# Patient Record
Sex: Female | Born: 1950 | Race: White | Hispanic: No | Marital: Single | Smoking: Never smoker
Health system: Southern US, Community
[De-identification: ages and names within clinical notes are randomized; demographics above are authoritative.]

## PROBLEM LIST (undated history)

## (undated) DIAGNOSIS — M549 Dorsalgia, unspecified: Secondary | ICD-10-CM

## (undated) DIAGNOSIS — M961 Postlaminectomy syndrome, not elsewhere classified: Secondary | ICD-10-CM

## (undated) DIAGNOSIS — IMO0001 Reserved for inherently not codable concepts without codable children: Secondary | ICD-10-CM

## (undated) DIAGNOSIS — T7840XA Allergy, unspecified, initial encounter: Secondary | ICD-10-CM

## (undated) DIAGNOSIS — F329 Major depressive disorder, single episode, unspecified: Secondary | ICD-10-CM

## (undated) DIAGNOSIS — K635 Polyp of colon: Secondary | ICD-10-CM

## (undated) DIAGNOSIS — K219 Gastro-esophageal reflux disease without esophagitis: Secondary | ICD-10-CM

## (undated) DIAGNOSIS — F410 Panic disorder [episodic paroxysmal anxiety] without agoraphobia: Secondary | ICD-10-CM

## (undated) DIAGNOSIS — K5909 Other constipation: Secondary | ICD-10-CM

## (undated) DIAGNOSIS — J309 Allergic rhinitis, unspecified: Secondary | ICD-10-CM

## (undated) DIAGNOSIS — Z9181 History of falling: Secondary | ICD-10-CM

## (undated) DIAGNOSIS — J449 Chronic obstructive pulmonary disease, unspecified: Secondary | ICD-10-CM

## (undated) DIAGNOSIS — R519 Headache, unspecified: Secondary | ICD-10-CM

## (undated) DIAGNOSIS — J45909 Unspecified asthma, uncomplicated: Secondary | ICD-10-CM

## (undated) DIAGNOSIS — E785 Hyperlipidemia, unspecified: Secondary | ICD-10-CM

## (undated) DIAGNOSIS — Z903 Acquired absence of stomach [part of]: Secondary | ICD-10-CM

## (undated) DIAGNOSIS — E079 Disorder of thyroid, unspecified: Secondary | ICD-10-CM

## (undated) DIAGNOSIS — Z9842 Cataract extraction status, left eye: Secondary | ICD-10-CM

## (undated) DIAGNOSIS — M1990 Unspecified osteoarthritis, unspecified site: Secondary | ICD-10-CM

## (undated) DIAGNOSIS — F32A Depression, unspecified: Secondary | ICD-10-CM

## (undated) DIAGNOSIS — G4730 Sleep apnea, unspecified: Secondary | ICD-10-CM

## (undated) DIAGNOSIS — G5602 Carpal tunnel syndrome, left upper limb: Secondary | ICD-10-CM

## (undated) DIAGNOSIS — M51369 Other intervertebral disc degeneration, lumbar region without mention of lumbar back pain or lower extremity pain: Secondary | ICD-10-CM

## (undated) DIAGNOSIS — G2581 Restless legs syndrome: Secondary | ICD-10-CM

## (undated) DIAGNOSIS — R001 Bradycardia, unspecified: Secondary | ICD-10-CM

## (undated) DIAGNOSIS — M722 Plantar fascial fibromatosis: Secondary | ICD-10-CM

## (undated) DIAGNOSIS — G629 Polyneuropathy, unspecified: Secondary | ICD-10-CM

## (undated) DIAGNOSIS — K449 Diaphragmatic hernia without obstruction or gangrene: Secondary | ICD-10-CM

## (undated) DIAGNOSIS — G894 Chronic pain syndrome: Secondary | ICD-10-CM

## (undated) DIAGNOSIS — J189 Pneumonia, unspecified organism: Secondary | ICD-10-CM

## (undated) DIAGNOSIS — G4700 Insomnia, unspecified: Secondary | ICD-10-CM

## (undated) DIAGNOSIS — I10 Essential (primary) hypertension: Secondary | ICD-10-CM

## (undated) DIAGNOSIS — E039 Hypothyroidism, unspecified: Secondary | ICD-10-CM

## (undated) DIAGNOSIS — G5640 Causalgia of unspecified upper limb: Secondary | ICD-10-CM

## (undated) DIAGNOSIS — R0609 Other forms of dyspnea: Secondary | ICD-10-CM

## (undated) DIAGNOSIS — K589 Irritable bowel syndrome without diarrhea: Secondary | ICD-10-CM

## (undated) DIAGNOSIS — R7303 Prediabetes: Secondary | ICD-10-CM

## (undated) DIAGNOSIS — G43909 Migraine, unspecified, not intractable, without status migrainosus: Secondary | ICD-10-CM

## (undated) DIAGNOSIS — R011 Cardiac murmur, unspecified: Secondary | ICD-10-CM

## (undated) DIAGNOSIS — F419 Anxiety disorder, unspecified: Secondary | ICD-10-CM

## (undated) DIAGNOSIS — I272 Pulmonary hypertension, unspecified: Secondary | ICD-10-CM

## (undated) DIAGNOSIS — Z9841 Cataract extraction status, right eye: Secondary | ICD-10-CM

## (undated) DIAGNOSIS — J942 Hemothorax: Secondary | ICD-10-CM

## (undated) DIAGNOSIS — I5030 Unspecified diastolic (congestive) heart failure: Secondary | ICD-10-CM

## (undated) DIAGNOSIS — K5790 Diverticulosis of intestine, part unspecified, without perforation or abscess without bleeding: Secondary | ICD-10-CM

## (undated) DIAGNOSIS — M8580 Other specified disorders of bone density and structure, unspecified site: Secondary | ICD-10-CM

## (undated) DIAGNOSIS — M797 Fibromyalgia: Secondary | ICD-10-CM

## (undated) DIAGNOSIS — D126 Benign neoplasm of colon, unspecified: Secondary | ICD-10-CM

## (undated) DIAGNOSIS — K760 Fatty (change of) liver, not elsewhere classified: Secondary | ICD-10-CM

## (undated) DIAGNOSIS — D649 Anemia, unspecified: Secondary | ICD-10-CM

## (undated) DIAGNOSIS — F40240 Claustrophobia: Secondary | ICD-10-CM

## (undated) DIAGNOSIS — M539 Dorsopathy, unspecified: Secondary | ICD-10-CM

## (undated) DIAGNOSIS — U071 COVID-19: Secondary | ICD-10-CM

## (undated) DIAGNOSIS — H269 Unspecified cataract: Secondary | ICD-10-CM

## (undated) HISTORY — PX: TENDON REPAIR: SHX5111

## (undated) HISTORY — DX: Anemia, unspecified: D64.9

## (undated) HISTORY — PX: WRIST SURGERY: SHX841

## (undated) HISTORY — DX: Allergy, unspecified, initial encounter: T78.40XA

## (undated) HISTORY — DX: Unspecified osteoarthritis, unspecified site: M19.90

## (undated) HISTORY — DX: Irritable bowel syndrome, unspecified: K58.9

## (undated) HISTORY — DX: Other specified disorders of bone density and structure, unspecified site: M85.80

## (undated) HISTORY — DX: Diverticulosis of intestine, part unspecified, without perforation or abscess without bleeding: K57.90

## (undated) HISTORY — DX: Hyperlipidemia, unspecified: E78.5

## (undated) HISTORY — DX: Dorsalgia, unspecified: M54.9

## (undated) HISTORY — DX: Panic disorder (episodic paroxysmal anxiety): F41.0

## (undated) HISTORY — DX: Benign neoplasm of colon, unspecified: D12.6

## (undated) HISTORY — DX: Plantar fascial fibromatosis: M72.2

## (undated) HISTORY — DX: Other intervertebral disc degeneration, lumbar region without mention of lumbar back pain or lower extremity pain: M51.369

## (undated) HISTORY — DX: Postlaminectomy syndrome, not elsewhere classified: M96.1

## (undated) HISTORY — DX: Fatty (change of) liver, not elsewhere classified: K76.0

## (undated) HISTORY — DX: Unspecified cataract: H26.9

## (undated) HISTORY — PX: ABDOMINAL HYSTERECTOMY: SHX81

## (undated) HISTORY — DX: Acquired absence of stomach (part of): Z90.3

## (undated) HISTORY — DX: Polyp of colon: K63.5

## (undated) HISTORY — DX: History of falling: Z91.81

## (undated) HISTORY — PX: CARPAL TUNNEL RELEASE: SHX101

## (undated) HISTORY — DX: Diaphragmatic hernia without obstruction or gangrene: K44.9

## (undated) HISTORY — DX: Disorder of thyroid, unspecified: E07.9

## (undated) HISTORY — PX: WOUND DEBRIDEMENT: SHX247

## (undated) HISTORY — PX: COLONOSCOPY: SHX174

## (undated) HISTORY — PX: WRIST ARTHROPLASTY: SHX1088

## (undated) HISTORY — DX: Depression, unspecified: F32.A

## (undated) HISTORY — DX: Major depressive disorder, single episode, unspecified: F32.9

## (undated) HISTORY — PX: KNEE ARTHROCENTESIS: SUR44

## (undated) HISTORY — PX: UPPER GASTROINTESTINAL ENDOSCOPY: SHX188

## (undated) HISTORY — DX: Polyneuropathy, unspecified: G62.9

## (undated) HISTORY — DX: Chronic pain syndrome: G89.4

## (undated) HISTORY — DX: Allergic rhinitis, unspecified: J30.9

## (undated) HISTORY — DX: Carpal tunnel syndrome, left upper limb: G56.02

## (undated) HISTORY — DX: COVID-19: U07.1

## (undated) HISTORY — DX: Migraine, unspecified, not intractable, without status migrainosus: G43.909

## (undated) HISTORY — DX: Causalgia of unspecified upper limb: G56.40

---

## 1997-05-31 ENCOUNTER — Ambulatory Visit (HOSPITAL_BASED_OUTPATIENT_CLINIC_OR_DEPARTMENT_OTHER): Admission: RE | Admit: 1997-05-31 | Discharge: 1997-05-31 | Payer: Self-pay | Admitting: Orthopedic Surgery

## 1997-11-15 ENCOUNTER — Ambulatory Visit (HOSPITAL_BASED_OUTPATIENT_CLINIC_OR_DEPARTMENT_OTHER): Admission: RE | Admit: 1997-11-15 | Discharge: 1997-11-15 | Payer: Self-pay | Admitting: Orthopedic Surgery

## 1998-02-16 ENCOUNTER — Other Ambulatory Visit: Admission: RE | Admit: 1998-02-16 | Discharge: 1998-02-16 | Payer: Self-pay | Admitting: Family Medicine

## 2000-09-05 ENCOUNTER — Ambulatory Visit (HOSPITAL_COMMUNITY): Admission: RE | Admit: 2000-09-05 | Discharge: 2000-09-05 | Payer: Self-pay | Admitting: Family Medicine

## 2000-10-24 ENCOUNTER — Ambulatory Visit (HOSPITAL_COMMUNITY): Admission: RE | Admit: 2000-10-24 | Discharge: 2000-10-24 | Payer: Self-pay | Admitting: Internal Medicine

## 2000-11-15 ENCOUNTER — Ambulatory Visit (HOSPITAL_COMMUNITY): Admission: RE | Admit: 2000-11-15 | Discharge: 2000-11-15 | Payer: Self-pay | Admitting: Internal Medicine

## 2000-11-15 ENCOUNTER — Encounter: Payer: Self-pay | Admitting: Internal Medicine

## 2001-05-14 ENCOUNTER — Ambulatory Visit (HOSPITAL_COMMUNITY): Admission: RE | Admit: 2001-05-14 | Discharge: 2001-05-14 | Payer: Self-pay | Admitting: Internal Medicine

## 2001-05-14 ENCOUNTER — Encounter: Payer: Self-pay | Admitting: Internal Medicine

## 2001-06-13 ENCOUNTER — Ambulatory Visit (HOSPITAL_COMMUNITY): Admission: RE | Admit: 2001-06-13 | Discharge: 2001-06-13 | Payer: Self-pay | Admitting: Internal Medicine

## 2001-06-13 ENCOUNTER — Encounter: Payer: Self-pay | Admitting: Internal Medicine

## 2001-09-11 ENCOUNTER — Encounter: Payer: Self-pay | Admitting: Internal Medicine

## 2001-09-11 ENCOUNTER — Ambulatory Visit (HOSPITAL_COMMUNITY): Admission: RE | Admit: 2001-09-11 | Discharge: 2001-09-11 | Payer: Self-pay | Admitting: Internal Medicine

## 2001-11-10 ENCOUNTER — Ambulatory Visit (HOSPITAL_COMMUNITY): Admission: RE | Admit: 2001-11-10 | Discharge: 2001-11-10 | Payer: Self-pay | Admitting: Internal Medicine

## 2001-11-10 ENCOUNTER — Encounter: Payer: Self-pay | Admitting: Internal Medicine

## 2001-12-30 ENCOUNTER — Ambulatory Visit (HOSPITAL_COMMUNITY): Admission: RE | Admit: 2001-12-30 | Discharge: 2001-12-30 | Payer: Self-pay | Admitting: *Deleted

## 2001-12-30 ENCOUNTER — Encounter: Payer: Self-pay | Admitting: *Deleted

## 2002-08-20 ENCOUNTER — Encounter: Payer: Self-pay | Admitting: Emergency Medicine

## 2002-08-21 ENCOUNTER — Inpatient Hospital Stay (HOSPITAL_COMMUNITY): Admission: AD | Admit: 2002-08-21 | Discharge: 2002-08-22 | Payer: Self-pay | Admitting: Emergency Medicine

## 2002-11-13 ENCOUNTER — Ambulatory Visit (HOSPITAL_COMMUNITY): Admission: RE | Admit: 2002-11-13 | Discharge: 2002-11-13 | Payer: Self-pay | Admitting: Family Medicine

## 2003-02-17 ENCOUNTER — Ambulatory Visit (HOSPITAL_COMMUNITY): Admission: RE | Admit: 2003-02-17 | Discharge: 2003-02-17 | Payer: Self-pay | Admitting: Internal Medicine

## 2003-04-29 ENCOUNTER — Ambulatory Visit (HOSPITAL_COMMUNITY): Admission: RE | Admit: 2003-04-29 | Discharge: 2003-04-29 | Payer: Self-pay | Admitting: Internal Medicine

## 2003-11-09 ENCOUNTER — Ambulatory Visit: Payer: Self-pay | Admitting: *Deleted

## 2003-12-29 ENCOUNTER — Ambulatory Visit: Payer: Self-pay | Admitting: Internal Medicine

## 2004-02-07 ENCOUNTER — Ambulatory Visit: Payer: Self-pay | Admitting: Internal Medicine

## 2004-02-24 ENCOUNTER — Ambulatory Visit (HOSPITAL_COMMUNITY): Admission: RE | Admit: 2004-02-24 | Discharge: 2004-02-24 | Payer: Self-pay | Admitting: Family Medicine

## 2004-06-23 ENCOUNTER — Ambulatory Visit: Payer: Self-pay | Admitting: Internal Medicine

## 2004-09-26 ENCOUNTER — Ambulatory Visit: Payer: Self-pay | Admitting: Nurse Practitioner

## 2004-09-26 ENCOUNTER — Ambulatory Visit (HOSPITAL_COMMUNITY): Admission: RE | Admit: 2004-09-26 | Discharge: 2004-09-26 | Payer: Self-pay | Admitting: Internal Medicine

## 2005-01-22 ENCOUNTER — Ambulatory Visit: Payer: Self-pay | Admitting: Internal Medicine

## 2005-03-21 ENCOUNTER — Ambulatory Visit: Payer: Self-pay | Admitting: Internal Medicine

## 2005-04-05 ENCOUNTER — Encounter: Admission: RE | Admit: 2005-04-05 | Discharge: 2005-04-05 | Payer: Self-pay | Admitting: *Deleted

## 2005-04-26 ENCOUNTER — Ambulatory Visit (HOSPITAL_COMMUNITY): Admission: RE | Admit: 2005-04-26 | Discharge: 2005-04-26 | Payer: Self-pay | Admitting: Internal Medicine

## 2005-05-02 ENCOUNTER — Ambulatory Visit: Payer: Self-pay | Admitting: Internal Medicine

## 2005-05-03 ENCOUNTER — Ambulatory Visit: Payer: Self-pay | Admitting: Internal Medicine

## 2005-06-05 ENCOUNTER — Emergency Department (HOSPITAL_COMMUNITY): Admission: EM | Admit: 2005-06-05 | Discharge: 2005-06-05 | Payer: Self-pay | Admitting: Emergency Medicine

## 2005-06-06 ENCOUNTER — Ambulatory Visit: Payer: Self-pay | Admitting: Family Medicine

## 2005-06-11 ENCOUNTER — Ambulatory Visit: Payer: Self-pay | Admitting: Internal Medicine

## 2005-06-18 ENCOUNTER — Ambulatory Visit: Payer: Self-pay | Admitting: Internal Medicine

## 2005-07-30 ENCOUNTER — Ambulatory Visit: Payer: Self-pay | Admitting: Internal Medicine

## 2005-07-31 ENCOUNTER — Ambulatory Visit (HOSPITAL_COMMUNITY): Admission: RE | Admit: 2005-07-31 | Discharge: 2005-07-31 | Payer: Self-pay | Admitting: Internal Medicine

## 2005-08-02 DIAGNOSIS — M47817 Spondylosis without myelopathy or radiculopathy, lumbosacral region: Secondary | ICD-10-CM | POA: Insufficient documentation

## 2005-09-18 ENCOUNTER — Ambulatory Visit: Payer: Self-pay | Admitting: Internal Medicine

## 2005-09-20 ENCOUNTER — Encounter: Admission: RE | Admit: 2005-09-20 | Discharge: 2005-09-20 | Payer: Self-pay | Admitting: Internal Medicine

## 2005-12-21 ENCOUNTER — Ambulatory Visit: Payer: Self-pay | Admitting: Nurse Practitioner

## 2005-12-24 ENCOUNTER — Encounter (INDEPENDENT_AMBULATORY_CARE_PROVIDER_SITE_OTHER): Payer: Self-pay | Admitting: Internal Medicine

## 2006-01-18 ENCOUNTER — Ambulatory Visit: Payer: Self-pay | Admitting: Internal Medicine

## 2006-03-18 ENCOUNTER — Ambulatory Visit: Payer: Self-pay | Admitting: Internal Medicine

## 2006-04-23 ENCOUNTER — Ambulatory Visit: Payer: Self-pay | Admitting: Nurse Practitioner

## 2006-09-24 ENCOUNTER — Encounter (INDEPENDENT_AMBULATORY_CARE_PROVIDER_SITE_OTHER): Payer: Self-pay | Admitting: Internal Medicine

## 2006-09-24 DIAGNOSIS — Z8739 Personal history of other diseases of the musculoskeletal system and connective tissue: Secondary | ICD-10-CM | POA: Insufficient documentation

## 2006-09-24 DIAGNOSIS — E039 Hypothyroidism, unspecified: Secondary | ICD-10-CM | POA: Insufficient documentation

## 2006-09-24 DIAGNOSIS — I10 Essential (primary) hypertension: Secondary | ICD-10-CM | POA: Insufficient documentation

## 2006-09-24 DIAGNOSIS — Z9889 Other specified postprocedural states: Secondary | ICD-10-CM | POA: Insufficient documentation

## 2006-09-24 DIAGNOSIS — Z87898 Personal history of other specified conditions: Secondary | ICD-10-CM | POA: Insufficient documentation

## 2010-03-12 ENCOUNTER — Encounter: Payer: Self-pay | Admitting: Internal Medicine

## 2012-06-04 DIAGNOSIS — E785 Hyperlipidemia, unspecified: Secondary | ICD-10-CM | POA: Insufficient documentation

## 2012-07-03 DIAGNOSIS — M533 Sacrococcygeal disorders, not elsewhere classified: Secondary | ICD-10-CM | POA: Insufficient documentation

## 2012-07-03 DIAGNOSIS — G8929 Other chronic pain: Secondary | ICD-10-CM | POA: Insufficient documentation

## 2012-09-18 DIAGNOSIS — Z91040 Latex allergy status: Secondary | ICD-10-CM | POA: Insufficient documentation

## 2012-12-02 DIAGNOSIS — J309 Allergic rhinitis, unspecified: Secondary | ICD-10-CM | POA: Insufficient documentation

## 2012-12-02 DIAGNOSIS — M791 Myalgia, unspecified site: Secondary | ICD-10-CM | POA: Insufficient documentation

## 2013-03-10 DIAGNOSIS — I10 Essential (primary) hypertension: Secondary | ICD-10-CM | POA: Insufficient documentation

## 2013-03-10 DIAGNOSIS — J449 Chronic obstructive pulmonary disease, unspecified: Secondary | ICD-10-CM | POA: Insufficient documentation

## 2013-04-30 DIAGNOSIS — R928 Other abnormal and inconclusive findings on diagnostic imaging of breast: Secondary | ICD-10-CM | POA: Insufficient documentation

## 2013-05-08 DIAGNOSIS — Z90710 Acquired absence of both cervix and uterus: Secondary | ICD-10-CM | POA: Insufficient documentation

## 2013-06-10 ENCOUNTER — Emergency Department: Payer: Self-pay | Admitting: Emergency Medicine

## 2013-06-10 LAB — COMPREHENSIVE METABOLIC PANEL
ALBUMIN: 3.8 g/dL (ref 3.4–5.0)
ALK PHOS: 86 U/L
ANION GAP: 7 (ref 7–16)
AST: 15 U/L (ref 15–37)
BILIRUBIN TOTAL: 0.4 mg/dL (ref 0.2–1.0)
BUN: 9 mg/dL (ref 7–18)
CHLORIDE: 103 mmol/L (ref 98–107)
CO2: 33 mmol/L — AB (ref 21–32)
CREATININE: 0.7 mg/dL (ref 0.60–1.30)
Calcium, Total: 8.7 mg/dL (ref 8.5–10.1)
EGFR (African American): >60
EGFR (Non-African Amer.): >60
GLUCOSE: 104 mg/dL — AB (ref 65–99)
OSMOLALITY: 284 (ref 275–301)
Potassium: 3.5 mmol/L (ref 3.5–5.1)
SGPT (ALT): 25 U/L (ref 12–78)
Sodium: 143 mmol/L (ref 136–145)
Total Protein: 7.5 g/dL (ref 6.4–8.2)

## 2013-06-10 LAB — LIPASE, BLOOD: LIPASE: 117 U/L (ref 73–393)

## 2013-06-10 LAB — CK TOTAL AND CKMB (NOT AT ARMC)
CK, Total: 32 U/L
CK-MB: 1.1 ng/mL (ref 0.5–3.6)

## 2013-06-10 LAB — CBC
HCT: 41.2 % (ref 35.0–47.0)
HGB: 14.2 g/dL (ref 12.0–16.0)
MCH: 29.2 pg (ref 26.0–34.0)
MCHC: 34.5 g/dL (ref 32.0–36.0)
MCV: 85 fL (ref 80–100)
Platelet: 152 10*3/uL (ref 150–440)
RBC: 4.86 10*6/uL (ref 3.80–5.20)
RDW: 13.9 % (ref 11.5–14.5)
WBC: 5.3 10*3/uL (ref 3.6–11.0)

## 2013-06-10 LAB — TROPONIN I: Troponin-I: <0.02 ng/mL

## 2014-01-28 DIAGNOSIS — F39 Unspecified mood [affective] disorder: Secondary | ICD-10-CM | POA: Insufficient documentation

## 2014-01-28 DIAGNOSIS — F419 Anxiety disorder, unspecified: Secondary | ICD-10-CM | POA: Insufficient documentation

## 2014-04-11 ENCOUNTER — Emergency Department: Payer: Self-pay | Admitting: Physician Assistant

## 2014-05-03 DIAGNOSIS — I272 Pulmonary hypertension, unspecified: Secondary | ICD-10-CM | POA: Insufficient documentation

## 2014-05-03 DIAGNOSIS — M79605 Pain in left leg: Secondary | ICD-10-CM | POA: Insufficient documentation

## 2014-05-03 DIAGNOSIS — Z9889 Other specified postprocedural states: Secondary | ICD-10-CM | POA: Insufficient documentation

## 2014-06-15 HISTORY — PX: CARDIAC CATHETERIZATION: SHX172

## 2014-07-08 DIAGNOSIS — G4733 Obstructive sleep apnea (adult) (pediatric): Secondary | ICD-10-CM | POA: Insufficient documentation

## 2014-08-04 ENCOUNTER — Emergency Department: Payer: Medicare Other

## 2014-08-04 ENCOUNTER — Emergency Department
Admission: EM | Admit: 2014-08-04 | Discharge: 2014-08-04 | Disposition: A | Discharge status: Home / Self Care | Payer: Medicare Other | Attending: Emergency Medicine | Admitting: Emergency Medicine

## 2014-08-04 ENCOUNTER — Encounter: Payer: Self-pay | Admitting: Emergency Medicine

## 2014-08-04 DIAGNOSIS — G4489 Other headache syndrome: Secondary | ICD-10-CM

## 2014-08-04 DIAGNOSIS — Z791 Long term (current) use of non-steroidal anti-inflammatories (NSAID): Secondary | ICD-10-CM | POA: Diagnosis not present

## 2014-08-04 DIAGNOSIS — Z79899 Other long term (current) drug therapy: Secondary | ICD-10-CM | POA: Diagnosis not present

## 2014-08-04 DIAGNOSIS — Z8669 Personal history of other diseases of the nervous system and sense organs: Secondary | ICD-10-CM | POA: Diagnosis not present

## 2014-08-04 DIAGNOSIS — R51 Headache: Secondary | ICD-10-CM | POA: Diagnosis present

## 2014-08-04 DIAGNOSIS — I10 Essential (primary) hypertension: Secondary | ICD-10-CM | POA: Insufficient documentation

## 2014-08-04 DIAGNOSIS — Z792 Long term (current) use of antibiotics: Secondary | ICD-10-CM | POA: Insufficient documentation

## 2014-08-04 DIAGNOSIS — Z7952 Long term (current) use of systemic steroids: Secondary | ICD-10-CM | POA: Insufficient documentation

## 2014-08-04 HISTORY — DX: Essential (primary) hypertension: I10

## 2014-08-04 HISTORY — DX: Chronic obstructive pulmonary disease, unspecified: J44.9

## 2014-08-04 MED ORDER — PROCHLORPERAZINE EDISYLATE 5 MG/ML IJ SOLN
INTRAMUSCULAR | Status: AC
Start: 2014-08-04 — End: 2014-08-04
  Filled 2014-08-04: qty 2

## 2014-08-04 MED ORDER — BUTALBITAL-APAP-CAFFEINE 50-325-40 MG PO TABS: 1.0000 | ORAL_TABLET | Freq: Four times a day (QID) | ORAL | Status: DC | PRN

## 2014-08-04 MED ORDER — BUTALBITAL-APAP-CAFFEINE 50-325-40 MG PO TABS
1.0000 | ORAL_TABLET | Freq: Four times a day (QID) | ORAL | Status: DC | PRN
  Administered 2014-08-04: 1 via ORAL

## 2014-08-04 MED ORDER — BUTALBITAL-APAP-CAFFEINE 50-325-40 MG PO TABS
ORAL_TABLET | ORAL | Status: AC
  Administered 2014-08-04: 1 via ORAL
  Filled 2014-08-04: qty 1

## 2014-08-04 MED ORDER — SODIUM CHLORIDE 0.9 % IV BOLUS (SEPSIS)
1000.0000 mL | Freq: Once | INTRAVENOUS | Status: AC
  Administered 2014-08-04: 1000 mL via INTRAVENOUS

## 2014-08-04 MED ORDER — PROCHLORPERAZINE EDISYLATE 5 MG/ML IJ SOLN
10.0000 mg | Freq: Four times a day (QID) | INTRAMUSCULAR | Status: DC | PRN
  Administered 2014-08-04: 10 mg via INTRAVENOUS
  Filled 2014-08-04: qty 2

## 2014-08-04 NOTE — Discharge Instructions (Signed)
Please seek medical attention for any high fevers, chest pain, shortness of breath, change in behavior, persistent vomiting, bloody stool or any other new or concerning symptoms. ° °Headaches, Frequently Asked Questions °MIGRAINE HEADACHES °Q: What is migraine? What causes it? How can I treat it? °A: Generally, migraine headaches begin as a dull ache. Then they develop into a constant, throbbing, and pulsating pain. You may experience pain at the temples. You may experience pain at the front or back of one or both sides of the head. The pain is usually accompanied by a combination of: °· Nausea. °· Vomiting. °· Sensitivity to light and noise. °Some people (about 15%) experience an aura (see below) before an attack. The cause of migraine is believed to be chemical reactions in the brain. Treatment for migraine may include over-the-counter or prescription medications. It may also include self-help techniques. These include relaxation training and biofeedback.  °Q: What is an aura? °A: About 15% of people with migraine get an "aura". This is a sign of neurological symptoms that occur before a migraine headache. You may see wavy or jagged lines, dots, or flashing lights. You might experience tunnel vision or blind spots in one or both eyes. The aura can include visual or auditory hallucinations (something imagined). It may include disruptions in smell (such as strange odors), taste or touch. Other symptoms include: °· Numbness. °· A "pins and needles" sensation. °· Difficulty in recalling or speaking the correct word. °These neurological events may last as long as 60 minutes. These symptoms will fade as the headache begins. °Q: What is a trigger? °A: Certain physical or environmental factors can lead to or "trigger" a migraine. These include: °· Foods. °· Hormonal changes. °· Weather. °· Stress. °It is important to remember that triggers are different for everyone. To help prevent migraine attacks, you need to figure  out which triggers affect you. Keep a headache diary. This is a good way to track triggers. The diary will help you talk to your healthcare professional about your condition. °Q: Does weather affect migraines? °A: Bright sunshine, hot, humid conditions, and drastic changes in barometric pressure may lead to, or "trigger," a migraine attack in some people. But studies have shown that weather does not act as a trigger for everyone with migraines. °Q: What is the link between migraine and hormones? °A: Hormones start and regulate many of your body's functions. Hormones keep your body in balance within a constantly changing environment. The levels of hormones in your body are unbalanced at times. Examples are during menstruation, pregnancy, or menopause. That can lead to a migraine attack. In fact, about three quarters of all women with migraine report that their attacks are related to the menstrual cycle.  °Q: Is there an increased risk of stroke for migraine sufferers? °A: The likelihood of a migraine attack causing a stroke is very remote. That is not to say that migraine sufferers cannot have a stroke associated with their migraines. In persons under age 40, the most common associated factor for stroke is migraine headache. But over the course of a person's normal life span, the occurrence of migraine headache may actually be associated with a reduced risk of dying from cerebrovascular disease due to stroke.  °Q: What are acute medications for migraine? °A: Acute medications are used to treat the pain of the headache after it has started. Examples over-the-counter medications, NSAIDs, ergots, and triptans.  °Q: What are the triptans? °A: Triptans are the newest class of abortive medications.   They are specifically targeted to treat migraine. Triptans are vasoconstrictors. They moderate some chemical reactions in the brain. The triptans work on receptors in your brain. Triptans help to restore the balance of a  neurotransmitter called serotonin. Fluctuations in levels of serotonin are thought to be a main cause of migraine.  °Q: Are over-the-counter medications for migraine effective? °A: Over-the-counter, or "OTC," medications may be effective in relieving mild to moderate pain and associated symptoms of migraine. But you should see your caregiver before beginning any treatment regimen for migraine.  °Q: What are preventive medications for migraine? °A: Preventive medications for migraine are sometimes referred to as "prophylactic" treatments. They are used to reduce the frequency, severity, and length of migraine attacks. Examples of preventive medications include antiepileptic medications, antidepressants, beta-blockers, calcium channel blockers, and NSAIDs (nonsteroidal anti-inflammatory drugs). °Q: Why are anticonvulsants used to treat migraine? °A: During the past few years, there has been an increased interest in antiepileptic drugs for the prevention of migraine. They are sometimes referred to as "anticonvulsants". Both epilepsy and migraine may be caused by similar reactions in the brain.  °Q: Why are antidepressants used to treat migraine? °A: Antidepressants are typically used to treat people with depression. They may reduce migraine frequency by regulating chemical levels, such as serotonin, in the brain.  °Q: What alternative therapies are used to treat migraine? °A: The term "alternative therapies" is often used to describe treatments considered outside the scope of conventional Western medicine. Examples of alternative therapy include acupuncture, acupressure, and yoga. Another common alternative treatment is herbal therapy. Some herbs are believed to relieve headache pain. Always discuss alternative therapies with your caregiver before proceeding. Some herbal products contain arsenic and other toxins. °TENSION HEADACHES °Q: What is a tension-type headache? What causes it? How can I treat it? °A:  Tension-type headaches occur randomly. They are often the result of temporary stress, anxiety, fatigue, or anger. Symptoms include soreness in your temples, a tightening band-like sensation around your head (a "vice-like" ache). Symptoms can also include a pulling feeling, pressure sensations, and contracting head and neck muscles. The headache begins in your forehead, temples, or the back of your head and neck. Treatment for tension-type headache may include over-the-counter or prescription medications. Treatment may also include self-help techniques such as relaxation training and biofeedback. °CLUSTER HEADACHES °Q: What is a cluster headache? What causes it? How can I treat it? °A: Cluster headache gets its name because the attacks come in groups. The pain arrives with little, if any, warning. It is usually on one side of the head. A tearing or bloodshot eye and a runny nose on the same side of the headache may also accompany the pain. Cluster headaches are believed to be caused by chemical reactions in the brain. They have been described as the most severe and intense of any headache type. Treatment for cluster headache includes prescription medication and oxygen. °SINUS HEADACHES °Q: What is a sinus headache? What causes it? How can I treat it? °A: When a cavity in the bones of the face and skull (a sinus) becomes inflamed, the inflammation will cause localized pain. This condition is usually the result of an allergic reaction, a tumor, or an infection. If your headache is caused by a sinus blockage, such as an infection, you will probably have a fever. An x-ray will confirm a sinus blockage. Your caregiver's treatment might include antibiotics for the infection, as well as antihistamines or decongestants.  °REBOUND HEADACHES °Q: What is a rebound   headache? What causes it? How can I treat it? °A: A pattern of taking acute headache medications too often can lead to a condition known as "rebound headache." A  pattern of taking too much headache medication includes taking it more than 2 days per week or in excessive amounts. That means more than the label or a caregiver advises. With rebound headaches, your medications not only stop relieving pain, they actually begin to cause headaches. Doctors treat rebound headache by tapering the medication that is being overused. Sometimes your caregiver will gradually substitute a different type of treatment or medication. Stopping may be a challenge. Regularly overusing a medication increases the potential for serious side effects. Consult a caregiver if you regularly use headache medications more than 2 days per week or more than the label advises. °ADDITIONAL QUESTIONS AND ANSWERS °Q: What is biofeedback? °A: Biofeedback is a self-help treatment. Biofeedback uses special equipment to monitor your body's involuntary physical responses. Biofeedback monitors: °· Breathing. °· Pulse. °· Heart rate. °· Temperature. °· Muscle tension. °· Brain activity. °Biofeedback helps you refine and perfect your relaxation exercises. You learn to control the physical responses that are related to stress. Once the technique has been mastered, you do not need the equipment any more. °Q: Are headaches hereditary? °A: Four out of five (80%) of people that suffer report a family history of migraine. Scientists are not sure if this is genetic or a family predisposition. Despite the uncertainty, a child has a 50% chance of having migraine if one parent suffers. The child has a 75% chance if both parents suffer.  °Q: Can children get headaches? °A: By the time they reach high school, most young people have experienced some type of headache. Many safe and effective approaches or medications can prevent a headache from occurring or stop it after it has begun.  °Q: What type of doctor should I see to diagnose and treat my headache? °A: Start with your primary caregiver. Discuss his or her experience and  approach to headaches. Discuss methods of classification, diagnosis, and treatment. Your caregiver may decide to recommend you to a headache specialist, depending upon your symptoms or other physical conditions. Having diabetes, allergies, etc., may require a more comprehensive and inclusive approach to your headache. The [employer] will provide, upon request, a list of NHF physician members in your state. °Document Released: 04/28/2003 Document Revised: 04/30/2011 Document Reviewed: 10/06/2007 °ExitCare® Patient Information ©2015 [employer]. This information is not intended to replace advice given to you by your health care provider. Make sure you discuss any questions you have with your health care provider. ° °

## 2014-08-04 NOTE — ED Notes (Signed)
Says severe headache since 830am.  Says she thought it was sinus, but bp was 230/115 at home.  Brought by ems.

## 2014-08-04 NOTE — ED Provider Notes (Signed)
[facility] Emergency Department Provider Note    ____________________________________________  Time seen: 1450  I have reviewed the triage vital signs and the nursing notes.   HISTORY  Chief Complaint Headache   History limited by: Not Limited   HPI [name] is a 64 y.o. female who presents to the emergency department today because of headache. The patient states that the headache started roughly 6 hours ago. It came on gradually. Is located behind her right eye and it is now spread globally. It is also started radiating down her neck. Describes it as pressure. She is having some photophobia. Has a history of migraines last one was a number of years ago. States that recently she has been dealing with sinus issues. Tried taking some medication for sinuses this morning. Denies any trauma to her head.     Past Medical History  Diagnosis Date  . Hypertension   . COPD (chronic obstructive pulmonary disease)     Patient Active Problem List   Diagnosis Date Noted  . HYPOTHYROIDISM 09/24/2006  . HYPERTENSION 09/24/2006  . BACK PAIN, CHRONIC, HX OF 09/24/2006  . MIGRAINES, HX OF 09/24/2006  . CARPAL TUNNEL RELEASE, RIGHT, HX OF 09/24/2006  . OSTEOARTHRITIS, LUMBOSACRAL SPINE 08/02/2005    Past Surgical History  Procedure Laterality Date  . Knee arthrocentesis Left   . Wrist arthroplasty Left   . Abdominal hysterectomy    . Carpal tunnel release Bilateral     Current Outpatient Rx  Name  Route  Sig  Dispense  Refill  . albuterol (PROVENTIL HFA;VENTOLIN HFA) 108 (90 BASE) MCG/ACT inhaler   Inhalation   Inhale 2 puffs into the lungs every 6 (six) hours as needed for wheezing or shortness of breath.         Marland Kitchen amoxicillin-clavulanate (AUGMENTIN) 875-125 MG per tablet   Oral   Take 1 tablet by mouth 2 (two) times daily.         Marland Kitchen aspirin 81 MG tablet   Oral   Take 81 mg by mouth daily.         Marland Kitchen atorvastatin (LIPITOR) 80 MG tablet    Oral   Take 80 mg by mouth daily.         . budesonide-formoterol (SYMBICORT) 160-4.5 MCG/ACT inhaler   Inhalation   Inhale 2 puffs into the lungs 2 (two) times daily.         . diazepam (VALIUM) 5 MG tablet   Oral   Take 5 mg by mouth every 8 (eight) hours as needed for anxiety.         . diclofenac (VOLTAREN) 75 MG EC tablet   Oral   Take 75 mg by mouth 2 (two) times daily.         . DULoxetine (CYMBALTA) 30 MG capsule   Oral   Take 30 mg by mouth 2 (two) times daily.         . famotidine (PEPCID) 20 MG tablet   Oral   Take 20 mg by mouth 2 (two) times daily.         . fluticasone (FLONASE) 50 MCG/ACT nasal spray   Each Nare   Place 2 sprays into both nostrils daily.         Marland Kitchen gabapentin (NEURONTIN) 300 MG capsule   Oral   Take 600 mg by mouth 2 (two) times daily.         . hydrochlorothiazide (HYDRODIURIL) 25 MG tablet   Oral   Take  25 mg by mouth every other day.          . levothyroxine (SYNTHROID, LEVOTHROID) 125 MCG tablet   Oral   Take 125 mcg by mouth daily before breakfast.         . lisinopril (PRINIVIL,ZESTRIL) 10 MG tablet   Oral   Take 10 mg by mouth daily.         Marland Kitchen loratadine (CLARITIN) 10 MG tablet   Oral   Take 10 mg by mouth daily.         . meloxicam (MOBIC) 7.5 MG tablet   Oral   Take 7.5 mg by mouth daily.         . metoprolol succinate (TOPROL-XL) 50 MG 24 hr tablet   Oral   Take 50 mg by mouth 2 (two) times daily. Take with or immediately following a meal.         . nitroGLYCERIN (NITROSTAT) 0.4 MG SL tablet   Sublingual   Place 0.4 mg under the tongue every 5 (five) minutes as needed for chest pain.         Marland Kitchen oxyCODONE-acetaminophen (PERCOCET/ROXICET) 5-325 MG per tablet   Oral   Take by mouth every 6 (six) hours as needed for severe pain.           Allergies Codeine; Hydrocodone-acetaminophen; Procaine hcl; and Tramadol hcl  No family history on file.  Social History History  Substance  Use Topics  . Smoking status: Never Smoker   . Smokeless tobacco: Not on file  . Alcohol Use: Yes    Review of Systems  Constitutional: Negative for fever. Cardiovascular: Negative for chest pain. Respiratory: Negative for shortness of breath. Gastrointestinal: Positive for nausea Genitourinary: Negative for dysuria. Musculoskeletal: Negative for back pain. Skin: Negative for rash. Neurological: Positive for headaches   10-point ROS otherwise negative.  ____________________________________________   PHYSICAL EXAM:  VITAL SIGNS: ED Triage Vitals  Enc Vitals Group     BP 08/04/14 1030 139/77 mmHg     Pulse Rate 08/04/14 1030 51     Resp 08/04/14 1030 18     Temp 08/04/14 1030 98 F (36.7 C)     Temp Source 08/04/14 1030 Oral     SpO2 08/04/14 1030 96 %     Weight 08/04/14 1030 200 lb (90.719 kg)     Height 08/04/14 1030 5\' 1"  (1.549 m)     Head Cir --      Peak Flow --      Pain Score 08/04/14 1030 10   Constitutional: Alert and oriented. Well appearing and in no distress. Eyes: Conjunctivae are normal. PERRL. Normal extraocular movements. ENT   Head: Normocephalic and atraumatic.   Nose: No congestion/rhinnorhea.   Mouth/Throat: Mucous membranes are moist.   Neck: No stridor. Hematological/Lymphatic/Immunilogical: No cervical lymphadenopathy. Cardiovascular: Normal rate, regular rhythm.  No murmurs, rubs, or gallops. Respiratory: Normal respiratory effort without tachypnea nor retractions. Breath sounds are clear and equal bilaterally. No wheezes/rales/rhonchi. Gastrointestinal: Soft and nontender. No distention. There is no CVA tenderness. Genitourinary: Deferred Musculoskeletal: Normal range of motion in all extremities. No joint effusions.  No lower extremity tenderness nor edema. Neurologic:  Normal speech and language. No gross focal neurologic deficits are appreciated. Speech is normal.  Skin:  Skin is warm, dry and intact. No rash  noted. Psychiatric: Mood and affect are normal. Speech and behavior are normal. Patient exhibits appropriate insight and judgment.  ____________________________________________    LABS (pertinent positives/negatives)  None  ____________________________________________  EKG  None  ____________________________________________    RADIOLOGY  CT head IMPRESSION: Negative CT head. ____________________________________________   PROCEDURES  Procedure(s) performed: None  Critical Care performed: No  ____________________________________________   INITIAL IMPRESSION / ASSESSMENT AND PLAN / ED COURSE  Pertinent labs & imaging results that were available during my care of the patient were reviewed by me and considered in my medical decision making (see chart for details).  She presents to the emergency department today with concerns for headache. On exam patient awake, alert no acute distress. Will get CT head given that patient does not have a migraine headache and a number of years. Additionally will give IV fluids and Compazine and reassess.  ----------------------------------------- 5:09 PM on 08/04/2014 -----------------------------------------  CT head negative. Patient states she had some relief with Compazine. Will discharge home with prescription for Fioricet.  ____________________________________________   FINAL CLINICAL IMPRESSION(S) / ED DIAGNOSES  Final diagnoses:  Other headache syndrome     Nance Pear, MD 08/04/14 1710

## 2014-09-09 DIAGNOSIS — M542 Cervicalgia: Secondary | ICD-10-CM | POA: Insufficient documentation

## 2014-10-06 ENCOUNTER — Ambulatory Visit (HOSPITAL_BASED_OUTPATIENT_CLINIC_OR_DEPARTMENT_OTHER): Payer: Medicare Other | Attending: Pulmonary Disease | Admitting: Radiology

## 2014-10-06 VITALS — Ht 60.0 in | Wt 207.0 lb

## 2014-10-06 DIAGNOSIS — G4733 Obstructive sleep apnea (adult) (pediatric): Secondary | ICD-10-CM | POA: Insufficient documentation

## 2014-10-06 DIAGNOSIS — R0683 Snoring: Secondary | ICD-10-CM | POA: Insufficient documentation

## 2014-10-09 ENCOUNTER — Encounter (HOSPITAL_BASED_OUTPATIENT_CLINIC_OR_DEPARTMENT_OTHER): Payer: Medicare Other | Admitting: Internal Medicine

## 2014-10-09 DIAGNOSIS — G4733 Obstructive sleep apnea (adult) (pediatric): Secondary | ICD-10-CM

## 2014-10-09 NOTE — Progress Notes (Signed)
Patient Name: [name], [name] Date: 10/06/2014 Gender: Female D.O.B: [birth year] Age (years): 64 Referring Provider: Modesto Charon Height (inches): 54 Interpreting Physician: Baird Lyons MD, ABSM Weight (lbs): 207 RPSGT: Carolin Coy BMI: 40 MRN: [id number] Neck Size: 17.50 CLINICAL INFORMATION Sleep Study Type: Split Night CPAP     Indication for sleep study: Fatigue, Hypertension, Morning Headaches, Obesity, OSA, Snoring, Witnessed Apneas     Epworth Sleepiness Score:  12/24  SLEEP STUDY TECHNIQUE As per the AASM Manual for the Scoring of Sleep and Associated Events v2.3 (April 2016) with a hypopnea requiring 4% desaturations.  The channels recorded and monitored were frontal, central and occipital EEG, electrooculogram (EOG), submentalis EMG (chin), nasal and oral airflow, thoracic and abdominal wall motion, anterior tibialis EMG, snore microphone, electrocardiogram, and pulse oximetry. Continuous positive airway pressure (CPAP) was initiated when the patient met split night criteria and was titrated according to treat sleep-disordered breathing.  MEDICATIONS Medications taken by the patient : charted for review Medications administered by patient during sleep study : voltaren, lipitor, pepcid, gabapentin, duloxetine, aspirin,claritin  RESPIRATORY PARAMETERS Diagnostic  Total AHI (/hr): 19.1 RDI (/hr): 24.4 OA Index (/hr): 2.3 CA Index (/hr): 0.0 REM AHI (/hr): N/A NREM AHI (/hr): 19.1 Supine AHI (/hr): N/A Non-supine AHI (/hr): 19.06 Min O2 Sat (%): 84.00 Mean O2 (%): 91.71 Time below 88% (min): 4.1   Titration  Optimal Pressure (cm): 8 AHI at Optimal Pressure (/hr): 2.3 Min O2 at Optimal Pressure (%): 86.0 Supine % at Optimal (%): 0 Sleep % at Optimal (%): 98    SLEEP ARCHITECTURE The recording time for the entire night was 369.5 minutes.  During a baseline period of 191.0 minutes, the patient slept for 157.4 minutes in REM and nonREM, yielding a  sleep efficiency of 82.4%. Sleep onset after lights out was 17.6 minutes with a REM latency of N/A minutes. The patient spent 13.98% of the night in stage N1 sleep, 86.02% in stage N2 sleep, 0.00% in stage N3 and 0.00% in REM.  During the titration period of 177.1 minutes, the patient slept for 164.5 minutes in REM and nonREM, yielding a sleep efficiency of 92.9%. Sleep onset after CPAP initiation was 10.2 minutes with a REM latency of 107.0 minutes. The patient spent 3.04% of the night in stage N1 sleep, 60.79% in stage N2 sleep, 0.00% in stage N3 and 36.17% in REM.  CARDIAC DATA The 2 lead EKG demonstrated sinus rhythm. The mean heart rate was 57.12 beats per minute. Other EKG findings include: None.  LEG MOVEMENT DATA The total Periodic Limb Movements of Sleep (PLMS) were 13. The PLMS index was 2.42 .  IMPRESSIONS  Moderate obstructive sleep apnea occurred during the diagnostic portion of the study(AHI = 19.1/hour). An optimal PAP pressure was selected for this patient ( 8 cm of water) No significant central sleep apnea occurred during the diagnostic portion of the study (CAI = 0.0/hour). Moderate oxygen desaturation was noted during the diagnostic portion of the study (Min O2 =84.00%). The patient snored with Moderate snoring volume during the diagnostic portion of the study. No cardiac abnormalities were noted during this study. Clinically significant periodic limb movements did not occur during sleep.  DIAGNOSIS Obstructive Sleep Apnea (327.23 [G47.33 ICD-10])  RECOMMENDATIONS Trial of CPAP therapy on 8 cm H2O with a Small size Resmed Nasal Pillow Mask AirFit P10 mask and heated humidification. Avoid alcohol, sedatives and other CNS depressants that may worsen sleep apnea and disrupt normal sleep architecture. Sleep hygiene should be reviewed  to assess factors that may improve sleep quality. Weight management and regular exercise should be initiated or continued.   Deneise Lever Diplomate, American Board of Sleep Medicine  ELECTRONICALLY SIGNED ON:  10/09/2014, 1:53 PM [place] PH: [phone]   FX: [phone] [place]

## 2014-10-26 ENCOUNTER — Encounter (HOSPITAL_COMMUNITY): Payer: Self-pay | Admitting: *Deleted

## 2014-10-27 ENCOUNTER — Inpatient Hospital Stay (HOSPITAL_COMMUNITY): Payer: Medicare Other

## 2014-10-27 ENCOUNTER — Inpatient Hospital Stay (HOSPITAL_COMMUNITY)
Admission: RE | Admit: 2014-10-27 | Discharge: 2014-10-31 | DRG: 473 | Disposition: A | Discharge status: Home / Self Care | Payer: Medicare Other | Source: Ambulatory Visit | Attending: Orthopedic Surgery | Admitting: Orthopedic Surgery

## 2014-10-27 ENCOUNTER — Inpatient Hospital Stay (HOSPITAL_COMMUNITY): Payer: Medicare Other | Admitting: Anesthesiology

## 2014-10-27 ENCOUNTER — Encounter (HOSPITAL_COMMUNITY): Payer: Self-pay | Admitting: Surgery

## 2014-10-27 ENCOUNTER — Encounter (HOSPITAL_COMMUNITY)
Admission: RE | Disposition: A | Discharge status: Home / Self Care | Payer: Self-pay | Source: Ambulatory Visit | Attending: Orthopedic Surgery

## 2014-10-27 DIAGNOSIS — I10 Essential (primary) hypertension: Secondary | ICD-10-CM | POA: Diagnosis present

## 2014-10-27 DIAGNOSIS — Z79899 Other long term (current) drug therapy: Secondary | ICD-10-CM

## 2014-10-27 DIAGNOSIS — R609 Edema, unspecified: Secondary | ICD-10-CM

## 2014-10-27 DIAGNOSIS — K219 Gastro-esophageal reflux disease without esophagitis: Secondary | ICD-10-CM | POA: Diagnosis present

## 2014-10-27 DIAGNOSIS — Z981 Arthrodesis status: Secondary | ICD-10-CM

## 2014-10-27 DIAGNOSIS — Z7951 Long term (current) use of inhaled steroids: Secondary | ICD-10-CM

## 2014-10-27 DIAGNOSIS — J449 Chronic obstructive pulmonary disease, unspecified: Secondary | ICD-10-CM | POA: Diagnosis not present

## 2014-10-27 DIAGNOSIS — M4712 Other spondylosis with myelopathy, cervical region: Principal | ICD-10-CM | POA: Diagnosis present

## 2014-10-27 DIAGNOSIS — G959 Disease of spinal cord, unspecified: Secondary | ICD-10-CM | POA: Diagnosis present

## 2014-10-27 DIAGNOSIS — J45909 Unspecified asthma, uncomplicated: Secondary | ICD-10-CM | POA: Diagnosis present

## 2014-10-27 DIAGNOSIS — F410 Panic disorder [episodic paroxysmal anxiety] without agoraphobia: Secondary | ICD-10-CM | POA: Diagnosis present

## 2014-10-27 DIAGNOSIS — Z419 Encounter for procedure for purposes other than remedying health state, unspecified: Secondary | ICD-10-CM

## 2014-10-27 DIAGNOSIS — R070 Pain in throat: Secondary | ICD-10-CM | POA: Diagnosis not present

## 2014-10-27 HISTORY — DX: Sleep apnea, unspecified: G47.30

## 2014-10-27 HISTORY — DX: Reserved for inherently not codable concepts without codable children: IMO0001

## 2014-10-27 HISTORY — DX: Cardiac murmur, unspecified: R01.1

## 2014-10-27 HISTORY — DX: Unspecified asthma, uncomplicated: J45.909

## 2014-10-27 HISTORY — PX: ANTERIOR CERVICAL DECOMP/DISCECTOMY FUSION: SHX1161

## 2014-10-27 HISTORY — DX: Anxiety disorder, unspecified: F41.9

## 2014-10-27 HISTORY — DX: Gastro-esophageal reflux disease without esophagitis: K21.9

## 2014-10-27 LAB — BASIC METABOLIC PANEL
Anion gap: 9 (ref 5–15)
BUN: 20 mg/dL (ref 6–20)
CHLORIDE: 102 mmol/L (ref 101–111)
CO2: 25 mmol/L (ref 22–32)
CREATININE: 0.73 mg/dL (ref 0.44–1.00)
Calcium: 9 mg/dL (ref 8.9–10.3)
GFR calc non Af Amer: >60 mL/min (ref >60)
Glucose, Bld: 101 mg/dL — ABNORMAL HIGH (ref 65–99)
POTASSIUM: 4 mmol/L (ref 3.5–5.1)
SODIUM: 136 mmol/L (ref 135–145)

## 2014-10-27 LAB — CBC
HEMATOCRIT: 41.2 % (ref 36.0–46.0)
Hemoglobin: 14.3 g/dL (ref 12.0–15.0)
MCH: 30.8 pg (ref 26.0–34.0)
MCHC: 34.7 g/dL (ref 30.0–36.0)
MCV: 88.6 fL (ref 78.0–100.0)
PLATELETS: 217 10*3/uL (ref 150–400)
RBC: 4.65 MIL/uL (ref 3.87–5.11)
RDW: 13.9 % (ref 11.5–15.5)
WBC: 12.3 10*3/uL — AB (ref 4.0–10.5)

## 2014-10-27 LAB — SURGICAL PCR SCREEN
MRSA, PCR: POSITIVE — AB
Staphylococcus aureus: POSITIVE — AB

## 2014-10-27 SURGERY — ANTERIOR CERVICAL DECOMPRESSION/DISCECTOMY FUSION 2 LEVELS
Anesthesia: General | Site: Neck

## 2014-10-27 MED ORDER — MIDAZOLAM HCL 2 MG/2ML IJ SOLN
0.5000 mg | Freq: Once | INTRAMUSCULAR | Status: AC | PRN
  Administered 2014-10-27: 2 mg via INTRAVENOUS

## 2014-10-27 MED ORDER — HEMOSTATIC AGENTS (NO CHARGE) OPTIME
TOPICAL | Status: DC | PRN
  Administered 2014-10-27 (×2): 1 via TOPICAL

## 2014-10-27 MED ORDER — HYDROMORPHONE HCL 1 MG/ML IJ SOLN
INTRAMUSCULAR | Status: AC
  Administered 2014-10-27: 0.5 mg via INTRAVENOUS
  Filled 2014-10-27: qty 2

## 2014-10-27 MED ORDER — PROPOFOL 10 MG/ML IV BOLUS
INTRAVENOUS | Status: AC
  Filled 2014-10-27: qty 20

## 2014-10-27 MED ORDER — FLUTICASONE PROPIONATE 50 MCG/ACT NA SUSP
2.0000 | Freq: Every day | NASAL | Status: DC
  Administered 2014-10-28 – 2014-10-31 (×4): 2 via NASAL
  Filled 2014-10-27: qty 16

## 2014-10-27 MED ORDER — ONDANSETRON HCL 4 MG/2ML IJ SOLN: 4.0000 mg | INTRAMUSCULAR | Status: DC | PRN

## 2014-10-27 MED ORDER — HYDROCHLOROTHIAZIDE 25 MG PO TABS
25.0000 mg | ORAL_TABLET | ORAL | Status: DC
  Administered 2014-10-29 – 2014-10-31 (×2): 25 mg via ORAL
  Filled 2014-10-27 (×2): qty 1

## 2014-10-27 MED ORDER — GABAPENTIN 300 MG PO CAPS
900.0000 mg | ORAL_CAPSULE | Freq: Two times a day (BID) | ORAL | Status: DC
  Administered 2014-10-27 – 2014-10-31 (×7): 900 mg via ORAL
  Filled 2014-10-27 (×4): qty 3
  Filled 2014-10-27: qty 9
  Filled 2014-10-27 (×4): qty 3

## 2014-10-27 MED ORDER — OXYCODONE HCL 5 MG PO TABS
ORAL_TABLET | ORAL | Status: AC
  Filled 2014-10-27: qty 2

## 2014-10-27 MED ORDER — LACTATED RINGERS IV SOLN
INTRAVENOUS | Status: DC
  Administered 2014-10-27 (×2): via INTRAVENOUS

## 2014-10-27 MED ORDER — FENTANYL CITRATE (PF) 250 MCG/5ML IJ SOLN
INTRAMUSCULAR | Status: AC
  Filled 2014-10-27: qty 5

## 2014-10-27 MED ORDER — LIDOCAINE HCL (CARDIAC) 20 MG/ML IV SOLN
INTRAVENOUS | Status: DC | PRN
  Administered 2014-10-27: 20 mg via INTRAVENOUS

## 2014-10-27 MED ORDER — ACETAMINOPHEN 10 MG/ML IV SOLN
1000.0000 mg | Freq: Four times a day (QID) | INTRAVENOUS | Status: AC
  Administered 2014-10-28 (×3): 1000 mg via INTRAVENOUS
  Filled 2014-10-27 (×4): qty 100

## 2014-10-27 MED ORDER — SODIUM CHLORIDE 0.9 % IV SOLN: 250.0000 mL | INTRAVENOUS | Status: DC

## 2014-10-27 MED ORDER — NEOSTIGMINE METHYLSULFATE 10 MG/10ML IV SOLN
INTRAVENOUS | Status: AC
  Filled 2014-10-27: qty 1

## 2014-10-27 MED ORDER — THROMBIN 20000 UNITS EX SOLR
CUTANEOUS | Status: AC
  Filled 2014-10-27: qty 20000

## 2014-10-27 MED ORDER — ROCURONIUM BROMIDE 100 MG/10ML IV SOLN
INTRAVENOUS | Status: DC | PRN
  Administered 2014-10-27: 10 mg via INTRAVENOUS
  Administered 2014-10-27: 20 mg via INTRAVENOUS
  Administered 2014-10-27: 50 mg via INTRAVENOUS

## 2014-10-27 MED ORDER — MORPHINE SULFATE (PF) 2 MG/ML IV SOLN: 1.0000 mg | INTRAVENOUS | Status: DC | PRN

## 2014-10-27 MED ORDER — HYDROMORPHONE HCL 1 MG/ML IJ SOLN
0.2500 mg | INTRAMUSCULAR | Status: DC | PRN
  Administered 2014-10-27 (×6): 0.5 mg via INTRAVENOUS

## 2014-10-27 MED ORDER — LISINOPRIL 10 MG PO TABS
10.0000 mg | ORAL_TABLET | Freq: Two times a day (BID) | ORAL | Status: DC
  Administered 2014-10-27 – 2014-10-31 (×8): 10 mg via ORAL
  Filled 2014-10-27 (×8): qty 1

## 2014-10-27 MED ORDER — MENTHOL 3 MG MT LOZG
1.0000 | LOZENGE | OROMUCOSAL | Status: DC | PRN
  Administered 2014-10-28: 3 mg via ORAL
  Filled 2014-10-27: qty 9

## 2014-10-27 MED ORDER — METHOCARBAMOL 500 MG PO TABS
ORAL_TABLET | ORAL | Status: AC
  Filled 2014-10-27: qty 1

## 2014-10-27 MED ORDER — METHOCARBAMOL 500 MG PO TABS
500.0000 mg | ORAL_TABLET | Freq: Four times a day (QID) | ORAL | Status: DC | PRN
  Administered 2014-10-27 – 2014-10-29 (×4): 500 mg via ORAL
  Filled 2014-10-27 (×4): qty 1

## 2014-10-27 MED ORDER — PHENOL 1.4 % MT LIQD
1.0000 | OROMUCOSAL | Status: DC | PRN
  Administered 2014-10-28: 1 via OROMUCOSAL
  Filled 2014-10-27: qty 177

## 2014-10-27 MED ORDER — LACTATED RINGERS IV SOLN
INTRAVENOUS | Status: DC
  Administered 2014-10-28: 02:00:00 via INTRAVENOUS

## 2014-10-27 MED ORDER — ALBUTEROL SULFATE (2.5 MG/3ML) 0.083% IN NEBU
3.0000 mL | INHALATION_SOLUTION | Freq: Four times a day (QID) | RESPIRATORY_TRACT | Status: DC | PRN
  Filled 2014-10-27: qty 3

## 2014-10-27 MED ORDER — SODIUM CHLORIDE 0.9 % IJ SOLN: 3.0000 mL | INTRAMUSCULAR | Status: DC | PRN

## 2014-10-27 MED ORDER — MIDAZOLAM HCL 5 MG/5ML IJ SOLN
INTRAMUSCULAR | Status: DC | PRN
  Administered 2014-10-27: 2 mg via INTRAVENOUS

## 2014-10-27 MED ORDER — ONDANSETRON HCL 4 MG/2ML IJ SOLN
INTRAMUSCULAR | Status: DC | PRN
  Administered 2014-10-27: 4 mg via INTRAVENOUS

## 2014-10-27 MED ORDER — PROPOFOL 10 MG/ML IV BOLUS
INTRAVENOUS | Status: DC | PRN
  Administered 2014-10-27: 200 mg via INTRAVENOUS

## 2014-10-27 MED ORDER — PROMETHAZINE HCL 25 MG/ML IJ SOLN: 6.2500 mg | INTRAMUSCULAR | Status: DC | PRN

## 2014-10-27 MED ORDER — BUPIVACAINE-EPINEPHRINE (PF) 0.25% -1:200000 IJ SOLN
INTRAMUSCULAR | Status: AC
  Filled 2014-10-27: qty 30

## 2014-10-27 MED ORDER — DEXAMETHASONE SODIUM PHOSPHATE 10 MG/ML IJ SOLN
INTRAMUSCULAR | Status: AC
  Filled 2014-10-27: qty 1

## 2014-10-27 MED ORDER — FENTANYL CITRATE (PF) 100 MCG/2ML IJ SOLN
INTRAMUSCULAR | Status: DC | PRN
  Administered 2014-10-27 (×3): 50 ug via INTRAVENOUS
  Administered 2014-10-27: 250 ug via INTRAVENOUS
  Administered 2014-10-27: 100 ug via INTRAVENOUS

## 2014-10-27 MED ORDER — BUPIVACAINE-EPINEPHRINE 0.25% -1:200000 IJ SOLN
INTRAMUSCULAR | Status: DC | PRN
  Administered 2014-10-27: 4 mL

## 2014-10-27 MED ORDER — CEFAZOLIN SODIUM-DEXTROSE 2-3 GM-% IV SOLR
2.0000 g | INTRAVENOUS | Status: AC
  Administered 2014-10-27: 2 g via INTRAVENOUS
  Filled 2014-10-27: qty 50

## 2014-10-27 MED ORDER — NEOSTIGMINE METHYLSULFATE 10 MG/10ML IV SOLN
INTRAVENOUS | Status: DC | PRN
  Administered 2014-10-27: 3 mg via INTRAVENOUS

## 2014-10-27 MED ORDER — METHOCARBAMOL 1000 MG/10ML IJ SOLN
500.0000 mg | Freq: Four times a day (QID) | INTRAVENOUS | Status: DC | PRN
  Filled 2014-10-27: qty 5

## 2014-10-27 MED ORDER — PROMETHAZINE HCL 25 MG/ML IJ SOLN
INTRAMUSCULAR | Status: AC
  Filled 2014-10-27: qty 1

## 2014-10-27 MED ORDER — NITROGLYCERIN 0.4 MG SL SUBL
0.4000 mg | SUBLINGUAL_TABLET | SUBLINGUAL | Status: DC | PRN
  Filled 2014-10-27: qty 1

## 2014-10-27 MED ORDER — DEXAMETHASONE SODIUM PHOSPHATE 10 MG/ML IJ SOLN
INTRAMUSCULAR | Status: DC | PRN
  Administered 2014-10-27: 4 mg via INTRAVENOUS

## 2014-10-27 MED ORDER — EPHEDRINE SULFATE 50 MG/ML IJ SOLN
INTRAMUSCULAR | Status: DC | PRN
  Administered 2014-10-27 (×3): 10 mg via INTRAVENOUS

## 2014-10-27 MED ORDER — ONDANSETRON HCL 4 MG/2ML IJ SOLN
INTRAMUSCULAR | Status: AC
  Filled 2014-10-27: qty 2

## 2014-10-27 MED ORDER — CEFAZOLIN SODIUM 1-5 GM-% IV SOLN
1.0000 g | Freq: Three times a day (TID) | INTRAVENOUS | Status: AC
  Administered 2014-10-27 – 2014-10-28 (×2): 1 g via INTRAVENOUS
  Filled 2014-10-27 (×2): qty 50

## 2014-10-27 MED ORDER — MIDAZOLAM HCL 2 MG/2ML IJ SOLN
INTRAMUSCULAR | Status: AC
  Administered 2014-10-27: 2 mg via INTRAVENOUS
  Filled 2014-10-27: qty 2

## 2014-10-27 MED ORDER — HYDROMORPHONE HCL 1 MG/ML IJ SOLN
INTRAMUSCULAR | Status: AC
  Administered 2014-10-27: 0.5 mg via INTRAVENOUS
  Filled 2014-10-27: qty 1

## 2014-10-27 MED ORDER — LIDOCAINE HCL (CARDIAC) 20 MG/ML IV SOLN
INTRAVENOUS | Status: AC
  Filled 2014-10-27: qty 5

## 2014-10-27 MED ORDER — METOPROLOL SUCCINATE ER 25 MG PO TB24
50.0000 mg | ORAL_TABLET | Freq: Two times a day (BID) | ORAL | Status: DC
  Administered 2014-10-28 – 2014-10-31 (×7): 50 mg via ORAL
  Filled 2014-10-27 (×7): qty 2

## 2014-10-27 MED ORDER — MEPERIDINE HCL 25 MG/ML IJ SOLN: 6.2500 mg | INTRAMUSCULAR | Status: DC | PRN

## 2014-10-27 MED ORDER — PHENYLEPHRINE HCL 10 MG/ML IJ SOLN
INTRAMUSCULAR | Status: DC | PRN
  Administered 2014-10-27: 40 ug via INTRAVENOUS

## 2014-10-27 MED ORDER — DULOXETINE HCL 30 MG PO CPEP
30.0000 mg | ORAL_CAPSULE | Freq: Two times a day (BID) | ORAL | Status: DC
  Administered 2014-10-27 – 2014-10-31 (×8): 30 mg via ORAL
  Filled 2014-10-27 (×8): qty 1

## 2014-10-27 MED ORDER — LABETALOL HCL 5 MG/ML IV SOLN
INTRAVENOUS | Status: DC | PRN
  Administered 2014-10-27 (×2): 5 mg via INTRAVENOUS
  Administered 2014-10-27: 10 mg via INTRAVENOUS

## 2014-10-27 MED ORDER — GLYCOPYRROLATE 0.2 MG/ML IJ SOLN
INTRAMUSCULAR | Status: DC | PRN
  Administered 2014-10-27: 0.6 mg via INTRAVENOUS

## 2014-10-27 MED ORDER — MORPHINE SULFATE (PF) 2 MG/ML IV SOLN
1.0000 mg | INTRAVENOUS | Status: DC | PRN
  Administered 2014-10-27: 2 mg via INTRAVENOUS
  Administered 2014-10-28 (×3): 4 mg via INTRAVENOUS
  Administered 2014-10-29: 2 mg via INTRAVENOUS
  Administered 2014-10-29 – 2014-10-30 (×2): 4 mg via INTRAVENOUS
  Filled 2014-10-27 (×2): qty 2
  Filled 2014-10-27: qty 1
  Filled 2014-10-27: qty 2
  Filled 2014-10-27 (×3): qty 1
  Filled 2014-10-27: qty 2

## 2014-10-27 MED ORDER — ARTIFICIAL TEARS OP OINT
TOPICAL_OINTMENT | OPHTHALMIC | Status: DC | PRN
Start: 2014-10-27 — End: 2014-10-27
  Administered 2014-10-27: 1 via OPHTHALMIC

## 2014-10-27 MED ORDER — ROCURONIUM BROMIDE 50 MG/5ML IV SOLN
INTRAVENOUS | Status: AC
  Filled 2014-10-27: qty 1

## 2014-10-27 MED ORDER — OXYCODONE HCL 5 MG PO TABS
5.0000 mg | ORAL_TABLET | ORAL | Status: DC | PRN
  Administered 2014-10-27: 5 mg via ORAL
  Administered 2014-10-27 – 2014-10-31 (×13): 10 mg via ORAL
  Filled 2014-10-27 (×14): qty 2

## 2014-10-27 MED ORDER — GLYCOPYRROLATE 0.2 MG/ML IJ SOLN
INTRAMUSCULAR | Status: AC
  Filled 2014-10-27: qty 4

## 2014-10-27 MED ORDER — LEVOTHYROXINE SODIUM 25 MCG PO TABS
125.0000 ug | ORAL_TABLET | Freq: Every day | ORAL | Status: DC
  Administered 2014-10-28 – 2014-10-31 (×4): 125 ug via ORAL
  Filled 2014-10-27 (×8): qty 1

## 2014-10-27 MED ORDER — MIDAZOLAM HCL 2 MG/2ML IJ SOLN
INTRAMUSCULAR | Status: AC
  Filled 2014-10-27: qty 4

## 2014-10-27 MED ORDER — MUPIROCIN 2 % EX OINT
1.0000 "application " | TOPICAL_OINTMENT | Freq: Once | CUTANEOUS | Status: AC
  Administered 2014-10-27: 1 via TOPICAL
  Filled 2014-10-27: qty 22

## 2014-10-27 MED ORDER — THROMBIN 20000 UNITS EX SOLR
OROMUCOSAL | Status: DC | PRN
  Administered 2014-10-27: 20 mL via TOPICAL

## 2014-10-27 MED ORDER — BUDESONIDE-FORMOTEROL FUMARATE 160-4.5 MCG/ACT IN AERO
2.0000 | INHALATION_SPRAY | Freq: Two times a day (BID) | RESPIRATORY_TRACT | Status: DC
  Administered 2014-10-27 – 2014-10-31 (×8): 2 via RESPIRATORY_TRACT
  Filled 2014-10-27: qty 6

## 2014-10-27 MED ORDER — SODIUM CHLORIDE 0.9 % IJ SOLN
3.0000 mL | Freq: Two times a day (BID) | INTRAMUSCULAR | Status: DC
  Administered 2014-10-27 – 2014-10-29 (×4): 3 mL via INTRAVENOUS

## 2014-10-27 MED ORDER — 0.9 % SODIUM CHLORIDE (POUR BTL) OPTIME
TOPICAL | Status: DC | PRN
  Administered 2014-10-27 (×2): 1000 mL

## 2014-10-27 SURGICAL SUPPLY — 66 items
BLADE SURG ROTATE 9660 (MISCELLANEOUS) IMPLANT
BUR EGG ELITE 4.0 (BURR) IMPLANT
BUR MATCHSTICK NEURO 3.0 LAGG (BURR) IMPLANT
CANISTER SUCTION 2500CC (MISCELLANEOUS) ×2 IMPLANT
CLSR STERI-STRIP ANTIMIC 1/2X4 (GAUZE/BANDAGES/DRESSINGS) ×2 IMPLANT
CORDS BIPOLAR (ELECTRODE) ×2 IMPLANT
COVER SURGICAL LIGHT HANDLE (MISCELLANEOUS) ×4 IMPLANT
CRADLE DONUT ADULT HEAD (MISCELLANEOUS) ×2 IMPLANT
DEVICE ENDSKLTN MED 6 7MM (Orthopedic Implant) ×1 IMPLANT
DEVICE ENDSKLTN TC MED 8MM (Orthopedic Implant) ×1 IMPLANT
DRAPE C-ARM 42X72 X-RAY (DRAPES) ×2 IMPLANT
DRAPE POUCH INSTRU U-SHP 10X18 (DRAPES) ×2 IMPLANT
DRAPE SURG 17X23 STRL (DRAPES) ×2 IMPLANT
DRAPE U-SHAPE 47X51 STRL (DRAPES) ×2 IMPLANT
DRSG MEPILEX BORDER 4X4 (GAUZE/BANDAGES/DRESSINGS) ×2 IMPLANT
DURAPREP 26ML APPLICATOR (WOUND CARE) ×2 IMPLANT
ELECT COATED BLADE 2.86 ST (ELECTRODE) ×2 IMPLANT
ELECT PENCIL ROCKER SW 15FT (MISCELLANEOUS) ×2 IMPLANT
ELECT REM PT RETURN 9FT ADLT (ELECTROSURGICAL) ×2
ELECTRODE REM PT RTRN 9FT ADLT (ELECTROSURGICAL) ×1 IMPLANT
ENDOSKELETON MED 6 7MM (Orthopedic Implant) ×2 IMPLANT
ENDOSKELTON TC IMPLANT 8MM MED (Orthopedic Implant) ×2 IMPLANT
GLOVE BIOGEL PI IND STRL 8 (GLOVE) ×1 IMPLANT
GLOVE BIOGEL PI IND STRL 8.5 (GLOVE) ×1 IMPLANT
GLOVE BIOGEL PI INDICATOR 8 (GLOVE) ×1
GLOVE BIOGEL PI INDICATOR 8.5 (GLOVE) ×1
GLOVE ORTHO TXT STRL SZ7.5 (GLOVE) ×2 IMPLANT
GLOVE SS BIOGEL STRL SZ 8.5 (GLOVE) ×1 IMPLANT
GLOVE SUPERSENSE BIOGEL SZ 8.5 (GLOVE) ×1
GOWN STRL REUS W/ TWL XL LVL3 (GOWN DISPOSABLE) ×2 IMPLANT
GOWN STRL REUS W/TWL 2XL LVL3 (GOWN DISPOSABLE) ×4 IMPLANT
GOWN STRL REUS W/TWL XL LVL3 (GOWN DISPOSABLE) ×2
KIT BASIN OR (CUSTOM PROCEDURE TRAY) ×2 IMPLANT
KIT ROOM TURNOVER OR (KITS) ×2 IMPLANT
NEEDLE SPNL 18GX3.5 QUINCKE PK (NEEDLE) ×2 IMPLANT
NS IRRIG 1000ML POUR BTL (IV SOLUTION) ×4 IMPLANT
PACK ORTHO CERVICAL (CUSTOM PROCEDURE TRAY) ×2 IMPLANT
PACK UNIVERSAL I (CUSTOM PROCEDURE TRAY) ×2 IMPLANT
PAD ARMBOARD 7.5X6 YLW CONV (MISCELLANEOUS) ×4 IMPLANT
PATTIES SURGICAL .25X.25 (GAUZE/BANDAGES/DRESSINGS) ×4 IMPLANT
PIN DISTRACTION 14 (PIN) ×2 IMPLANT
PIN RETAINER PRODISC 14 MM (PIN) ×2 IMPLANT
PLATE SKYLINE 2 LEVEL 34MM (Plate) ×2 IMPLANT
PUTTY BONE DBX 5CC MIX (Putty) ×2 IMPLANT
RESTRAINT LIMB HOLDER UNIV (RESTRAINTS) ×2 IMPLANT
SCREW SKYLINE 14MM SD-VA (Screw) ×8 IMPLANT
SCREW SKYLINE 16MM (Screw) ×4 IMPLANT
SLEEVE SURGEON STRL (DRAPES) ×2 IMPLANT
SPONGE INTESTINAL PEANUT (DISPOSABLE) ×2 IMPLANT
SPONGE LAP 4X18 X RAY DECT (DISPOSABLE) ×4 IMPLANT
SPONGE SURGIFOAM ABS GEL 100 (HEMOSTASIS) ×2 IMPLANT
SURGIFLO W/THROMBIN 8M KIT (HEMOSTASIS) ×4 IMPLANT
SUT BONE WAX W31G (SUTURE) ×2 IMPLANT
SUT MON AB 3-0 SH 27 (SUTURE) ×1
SUT MON AB 3-0 SH27 (SUTURE) ×1 IMPLANT
SUT SILK 2 0 (SUTURE) ×1
SUT SILK 2-0 18XBRD TIE 12 (SUTURE) ×1 IMPLANT
SUT VIC AB 2-0 CT1 18 (SUTURE) ×2 IMPLANT
SYR 3ML LL SCALE MARK (SYRINGE) ×2 IMPLANT
SYR BULB IRRIGATION 50ML (SYRINGE) ×2 IMPLANT
SYR CONTROL 10ML LL (SYRINGE) ×2 IMPLANT
TAPE CLOTH 4X10 WHT NS (GAUZE/BANDAGES/DRESSINGS) ×2 IMPLANT
TAPE UMBILICAL COTTON 1/8X30 (MISCELLANEOUS) ×2 IMPLANT
TOWEL OR 17X24 6PK STRL BLUE (TOWEL DISPOSABLE) ×2 IMPLANT
TOWEL OR 17X26 10 PK STRL BLUE (TOWEL DISPOSABLE) ×2 IMPLANT
WATER STERILE IRR 1000ML POUR (IV SOLUTION) IMPLANT

## 2014-10-27 NOTE — Anesthesia Procedure Notes (Signed)
Procedure Name: Intubation Date/Time: 10/27/2014 11:53 AM Performed by: Ignacia Bayley Pre-anesthesia Checklist: Patient identified, Emergency Drugs available, Suction available and Patient being monitored Patient Re-evaluated:Patient Re-evaluated prior to inductionOxygen Delivery Method: Circle system utilized Preoxygenation: Pre-oxygenation with 100% oxygen Intubation Type: IV induction Ventilation: Mask ventilation without difficulty Laryngoscope Size: Glidescope Grade View: Grade II Tube type: Oral Tube size: 7.0 mm Number of attempts: 1 Airway Equipment and Method: Stylet and Video-laryngoscopy Placement Confirmation: positive ETCO2,  breath sounds checked- equal and bilateral and ETT inserted through vocal cords under direct vision Secured at: 21 cm Tube secured with: Tape Dental Injury: Teeth and Oropharynx as per pre-operative assessment

## 2014-10-27 NOTE — Transfer of Care (Signed)
Immediate Anesthesia Transfer of Care Note  Patient: [name]  Procedure(s) Performed: Procedure(s): ANTERIOR CERVICAL DECOMPRESSION/DISCECTOMY FUSION C4 - C6   2 LEVELS (N/A)  Patient Location: PACU  Anesthesia Type:General  Level of Consciousness: awake and sedated  Airway & Oxygen Therapy: Patient Spontanous Breathing and Patient connected to face mask oxygen  Post-op Assessment: Report given to RN and Post -op Vital signs reviewed and stable  Post vital signs: stable  Last Vitals:  Filed Vitals:   10/27/14 1524  BP:   Pulse:   Temp: 37 C  Resp: 16    Complications: No apparent anesthesia complications

## 2014-10-27 NOTE — Brief Op Note (Signed)
10/27/2014  3:11 PM  PATIENT:  [name]  64 y.o. female  PRE-OPERATIVE DIAGNOSIS:  cervical myelopathy  POST-OPERATIVE DIAGNOSIS:  cervical myelopathy  PROCEDURE:  Procedure(s): ANTERIOR CERVICAL DECOMPRESSION/DISCECTOMY FUSION C4 - C6   2 LEVELS (N/A)  SURGEON:  Surgeon(s) and Role:    * Melina Schools, MD - Primary  PHYSICIAN ASSISTANT:   ASSISTANTS: Carmen Mayo   ANESTHESIA:   general  EBL:  Total I/O In: 1000 [I.V.:1000] Out: 1000 [Urine:950; Blood:50]  BLOOD ADMINISTERED:none  DRAINS: none   LOCAL MEDICATIONS USED:  MARCAINE     SPECIMEN:  No Specimen  DISPOSITION OF SPECIMEN:  N/A  COUNTS:  YES  TOURNIQUET:  * No tourniquets in log *  DICTATION: .Other Dictation: Dictation Number [id number]  PLAN OF CARE: Admit for overnight observation  PATIENT DISPOSITION:  PACU - hemodynamically stable.

## 2014-10-27 NOTE — Anesthesia Postprocedure Evaluation (Signed)
  Anesthesia Post-op Note  Patient: [name]  Procedure(s) Performed: Procedure(s): ANTERIOR CERVICAL DECOMPRESSION/DISCECTOMY FUSION C4 - C6   2 LEVELS (N/A)  Patient Location: PACU  Anesthesia Type:General  Level of Consciousness: oriented, sedated, patient cooperative and responds to stimulation  Airway and Oxygen Therapy: Patient Spontanous Breathing and Patient connected to nasal cannula oxygen  Post-op Pain: mild  Post-op Assessment: Post-op Vital signs reviewed, Patient's Cardiovascular Status Stable, Respiratory Function Stable, Patent Airway, No signs of Nausea or vomiting and Pain level controlled LLE Motor Response: Responds to commands, Purposeful movement LLE Sensation: Full sensation RLE Motor Response: Responds to commands, Purposeful movement RLE Sensation: Full sensation      Post-op Vital Signs: Reviewed and stable  Last Vitals:  Filed Vitals:   10/27/14 1645  BP: 160/71  Pulse: 74  Temp:   Resp: 15    Complications: No apparent anesthesia complications

## 2014-10-27 NOTE — H&P (Signed)
History of Present Illness The patient is a 64 year old female who presents today for follow up of their back. The patient is being followed for their back pain and neck pain. Symptoms reported today include: pain. The patient states that they are doing poorly (increased pain in back and neck). Current treatment includes: activity modification. The following medication has been used for pain control: Percocet. The patient presents today following MRI (thoracic). The patient indicates that they have questions or concerns today regarding another injection br Dr Nelva Bush. She finds these injections helpful.   Subjective Transcription At this point in time, the patient is having some difficulty maintaining her balance. Her gait is disturbed, but she has an unequivocal Babinski and Hoffmann sign. She has significant radicular left arm pain in the C5 and C6 dermatomes and dysesthesias in the same location. She also has some trace weakness, 4+ to 4/5 biceps and deltoid strength on the left side compared to the right. She has horrific neck pain as well.  IMAGING At this point, the repeat MRI does show cord signal changes at the C4-5 and C5-6 levels. There is mild right disc protrusion and mild stenosis at C6-7, but the bulk of the severe stenosis is at C4-5 and C5-6. Also on the MRI, she has cord signal changes at the C4-5 and C5-6 levels. While there is some degenerative disease at C6-7, it is not severe.  Allergies  Ultram *ANALGESICS - OPIOID* Latex Adhesive Tape  Social History  Number of flights of stairs before winded 1 Marital status divorced Living situation live alone Tobacco use never smoker Tobacco / smoke exposure yes Pain Contract no Illicit drug use no Current work status disabled Children 2 Alcohol use former drinker Exercise Exercises daily; does running / walking Drug/Alcohol Rehab (Previously) no Drug/Alcohol Rehab (Currently) no  Medication History  Diclofenac  Sodium (75MG  Tablet DR, 1 (one) Oral po bid w/ food, Taken starting 09/21/2014) Active. Pepcid AC (10MG  Tablet Chewable, Oral) Active. Lipitor (20MG  Tablet, Oral) Active. Butalbital-APAP-Caffeine (50-325-40MG  Tablet, Oral) Active. Voltaren (Oral) Specific dose unknown - Active. Claritin (10MG  Tablet, Oral) Active. Famotidine (20MG  Tablet, Oral) Active. Fluticasone Propionate (50MCG/ACT Suspension, Nasal) Active. Lisinopril (10MG  Tablet, Oral) Active. Diazepam (5MG  Tablet, Oral) Active. Oxycodone-Acetaminophen (5-325MG  Tablet, Oral) Active. Aspirin (81MG  Tablet, 1 (one) Oral) Active. Dicyclomine HCl (20MG  Tablet, Oral) Active. DULoxetine HCl (30MG  Capsule DR Part, Oral) Active. Epi E-Z Pen (1:1000 Device, Injection) Active. (prn) Gabapentin (300MG  Capsule, Oral) Active. Hydrochlorothiazide (25MG  Tablet, Oral) Active. Levothyroxine Sodium (125MCG Tablet, Oral) Active. Metoprolol Tartrate (50MG  Tablet, Oral) Active. ProAir HFA (108 (90 Base)MCG/ACT Aerosol Soln, Inhalation) Active. Symbicort (160-4.5MCG/ACT Aerosol, Inhalation) Active. Medications Reconciled  Physical Exam  General General Appearance-Not in acute distress. Orientation-Oriented X3. Build & Nutrition-Well nourished and Well developed.  Integumentary General Characteristics Surgical Scars - no surgical scar evidence of previous cervical surgery. Cervical Spine-Skin examination of the cervical spine is without deformity, skin lesions, lacerations or abrasions.  Peripheral Vascular Upper Extremity Palpation - Radial pulse - Bilateral - 2+.  Neurologic Sensation Upper Extremity - Bilateral - sensation is intact in the upper extremity. Reflexes Biceps Reflex - Bilateral - 2+. Brachioradialis Reflex - Bilateral - 2+. Triceps Reflex - Bilateral - 2+. Hoffman's Sign - Bilateral - Hoffman's sign not present.  Musculoskeletal Spine/Ribs/Pelvis  Cervical Spine : Inspection and Palpation -  Tenderness - right cervical paraspinals tender to palpation and left cervical paraspinals tender to palpation. Strength and Tone: Strength: Strength: Bilateral - 5/5. Right - 4/5. Deltoid -  Left - 2/5. Biceps - Left - 2/5. Right - 4-/5. Triceps - Left - 3/5. Right - 5/5. Wrist Extension - Left - 4-/5. Right - 5/5. Bilateral - 5/5. Hand Grip - Left - 4-/5. Right - 2/5. Heel walk - Bilateral - able to heel walk without difficulty. Toe Walk - Bilateral - able to walk on toes without difficulty. Heel-Toe Walk - Bilateral - able to heel-toe walk without difficulty. ROM - Flexion - Mildly decreased and painful. Extension - Mildly decreased and painful. Left Rotation - Moderately Decreased and painful. Right Rotation - Moderately Decreased and painful. Pain - flexion is more painful than extension. Cervical Spine - Special Testing - axial compression test negative, cross chest impingement test negative. Non-Anatomic Signs - No non-anatomic signs present. Upper Extremity Range of Motion - No truesholder pain with IR/ER of the shoulders.  Assessment & Plan  Anterior cervical fusion:Risks of surgery include, but are not limited to: Throat pain, swallowing difficulty, hoarseness or change in voice, death, stroke, paralysis, nerve root damage/injury, bleeding, blood clots, loss of bowel/bladder control, hardware failure, or mal-position, spinal fluid leak, adjacent segment disease, non-union, need for further surgery, ongoing or worse pain, infection. Post-operative bleeding or swelling that could require emergent surgery.]  Goal Of Surgery:Discussed that goal of surgery is to reduce pain and improve function and quality of life. Patient is aware that despite all appropriate treatment that there pain and function could be the same, worse, or different.  Plans Transcription My recommendation is moving forward with a two-level ACDF to address the myelopathic change. I think with stabilization and decompression, we can  hopefully prevent worsening of the myelopathy. I told her that the goal of surgery is not improving her function, but preventing further loss. We have reviewed the risks of an anterior cervical procedure, which include infection, bleeding, nerve damage, death, stroke, paralysis, failure to heal, need for further surgery, ongoing or worse pain, loss of bowel or bladder control, throat pain, swallowing difficulty, hoarseness in the voice, and need for supplemental posterior fixation. Since this is a multilevel procedure and she has underlying COPD and other medical comorbidities, I would like to use an external bone stimulator for at least nine months to improve the overall fusion rate. I have reviewed this with the patient. I have demonstrated the surgery on a model. All of her questions were addressed.  We will plan on moving forward with the ACDF once we have preoperative medical clearance from her pulmonologist and her primary care physician. The patient has been on chronic Percocet and has chronic pain issues. Currently Dr. Noberto Retort is managing her pain medication. I have requested that she see Dr. Nelva Bush for a repeat SI joint injection, which did very well, as well as to discuss a formal pain medical management consultation. The other option is to have Dr. Noberto Retort continue to manage her pain even throughout the perioperative period. If Dr. Noberto Retort is comfortable with this, then I am fine with leaving her under the care of Dr. Noberto Retort to manage all of her narcotic pain requirements otherwise, I would like to get her in to Dr. Nelva Bush to do this. She is going to talk to Dr. Noberto Retort and then we will determine how to move forward at that time.

## 2014-10-27 NOTE — Anesthesia Preprocedure Evaluation (Addendum)
Anesthesia Evaluation  Patient identified by MRN, date of birth, ID band Patient awake    Reviewed: Allergy & Precautions, NPO status , Patient's Chart, lab work & pertinent test results, reviewed documented beta blocker date and time   History of Anesthesia Complications Negative for: history of anesthetic complications  Airway Mallampati: II  TM Distance: >3 FB Neck ROM: Full    Dental  (+) Missing, Dental Advisory Given   Pulmonary asthma , sleep apnea and Continuous Positive Airway Pressure Ventilation , COPD,  COPD inhaler,    breath sounds clear to auscultation       Cardiovascular hypertension, Pt. on medications and Pt. on home beta blockers (-) angina Rhythm:Regular Rate:Normal  4/16 cath: normal coronaries   Neuro/Psych Anxiety    GI/Hepatic Neg liver ROS, GERD  Medicated and Controlled,  Endo/Other  Hypothyroidism Morbid obesity  Renal/GU negative Renal ROS     Musculoskeletal  (+) Arthritis , Osteoarthritis,    Abdominal (+) + obese,   Peds  Hematology negative hematology ROS (+)   Anesthesia Other Findings   Reproductive/Obstetrics                          Anesthesia Physical Anesthesia Plan  ASA: III  Anesthesia Plan: General   Post-op Pain Management:    Induction: Intravenous  Airway Management Planned: Oral ETT and Video Laryngoscope Planned  Additional Equipment:   Intra-op Plan:   Post-operative Plan:   Informed Consent: I have reviewed the patients History and Physical, chart, labs and discussed the procedure including the risks, benefits and alternatives for the proposed anesthesia with the patient or authorized representative who has indicated his/her understanding and acceptance.   Dental advisory given  Plan Discussed with: CRNA and Surgeon  Anesthesia Plan Comments: (Plan routine monitors, GETA with VideoGlide intubation )        Anesthesia  Quick Evaluation

## 2014-10-28 ENCOUNTER — Encounter (HOSPITAL_COMMUNITY): Payer: Self-pay | Admitting: Orthopedic Surgery

## 2014-10-28 NOTE — Progress Notes (Signed)
    Subjective: Procedure(s) (LRB): ANTERIOR CERVICAL DECOMPRESSION/DISCECTOMY FUSION C4 - C6   2 LEVELS (N/A) 1 Day Post-Op  Patient reports pain as 3 on 0-10 scale.  Reports decreased arm pain reports incisional neck pain   Positive void Negative bowel movement Positive flatus Negative chest pain or shortness of breath  Objective: Vital signs in last 24 hours: Temp:  [97.8 F (36.6 C)-98.6 F (37 C)] 97.8 F (36.6 C) (09/08 0659) Pulse Rate:  [68-102] 85 (09/08 0659) Resp:  [12-21] 20 (09/08 0659) BP: (117-183)/(71-99) 167/99 mmHg (09/08 0659) SpO2:  [97 %-100 %] 100 % (09/08 0659)  Intake/Output from previous day: 09/07 0701 - 09/08 0700 In: 1200 [I.V.:1200] Out: 4425 [Urine:4375; Blood:50]  Labs:  Recent Labs  10/27/14 0955  WBC 12.3*  RBC 4.65  HCT 41.2  PLT 217    Recent Labs  10/27/14 0955  NA 136  K 4.0  CL 102  CO2 25  BUN 20  CREATININE 0.73  GLUCOSE 101*  CALCIUM 9.0   No results for input(s): LABPT, INR in the last 72 hours.  Physical Exam: Neurologically intact Sensation intact distally Incision: dressing C/D/I Compartment soft  Assessment/Plan: Patient stable  xrays satisfactory Mobilization with physical therapy Encourage incentive spirometry Continue care  Advance diet Up with therapy Plan for discharge tomorrow  Awaiting therapy Awaiting respiratory eval for CPAP - order was not taken off last night WIll transfer immediately to 5N for more appropriate care  Melina Schools, MD [place] [phone]

## 2014-10-28 NOTE — Progress Notes (Signed)
The respiratory orders were not visible to the respiratory team for 5 C this was discovered this morning and [place] alerted the systems people about the issue and resolution is in process.

## 2014-10-28 NOTE — Progress Notes (Signed)
Dr. is not happy with the care his patient has received And has requested an incident report be filed for no CPAP being set up for her. Will continue to follow up on this situation.

## 2014-10-28 NOTE — Op Note (Signed)
[name], [name]              ACCOUNT NO.:  [id number]  MEDICAL RECORD NO.:  [id number]  LOCATION:  5C18C                        FACILITY:  [facility]  PHYSICIAN:  Tymier Lindholm D. Rolena Infante, M.D. DATE OF BIRTH:  1950-05-31  DATE OF PROCEDURE:  10/27/2014 DATE OF DISCHARGE:                              OPERATIVE REPORT   POSTOPERATIVE DIAGNOSIS:  Cervical spondylitic myelopathy.  POSTOPERATIVE DIAGNOSIS:  Cervical spondylitic myelopathy.  OPERATIVE PROCEDURE:  Anterior cervical diskectomy and fusion, C4-5, C5- 6.  COMPLICATIONS:  None.  FIRST ASSISTANT:  Ronette Deter, P.A.  INSTRUMENTATION SYSTEM:  Used was a Titan titanium intervertebral cages, size medium, 8 mm high at C4-5, 7 mm high at C5-6, with a 34 mm anterior cervical DePuy Skyline plate affixed with 14 mm self-drilling screws into the bodies of C5 and C6 and 16 mm screws into the body of C4.  COMPLICATIONS:  No intraoperative complications.  HISTORY:  This is a very pleasant 64 year old woman with underlying COPD and pulmonary issues, who presents with progressive neck pain, arm pain, and difficulty maintaining balance and ambulating.  The patient's clinical exam was consistent with cervical spondylotic myelopathy. After discussing treatment options, we elected to proceed with surgery. All appropriate risks, benefits, and alternatives were discussed with the patient and consent was obtained.  OPERATIVE NOTE:  The patient was brought to the operating room, placed supine on the operating table.  After successful induction of general anesthesia and endotracheal intubation, TEDs, SCDs, and a Foley were inserted.  Inflatable bag was placed underneath the shoulder blades and the anterior cervical spine was prepped and draped in a standard fashion.  Time-out was taken to confirm the patient, procedure, and all other pertinent important data.  I identified the C5 vertebral body on the lateral plane and then marked out a transverse  incision.  I infiltrated this incision with 0.25% Marcaine with epinephrine.  An incision was made starting at the midline, proceeding to the left-hand side.  Sharp dissection was carried out down to and through the platysma.  I continued dissecting sharply along the medial border of the sternocleidomastoid.  I identified the omohyoid.  Because of her body habitus and depth, I elected to sacrifice the omohyoid for better visualization.  The omohyoid was resected and I continued to bluntly dissect into the prevertebral fascia.  Cloward retractors were placed to retract the esophagus and I identified and protected the carotid sheath with my finger.  Kittner dissectors were then used to completely visualize the C4-5 and C5-6 disk spaces.  Once this was completed, I placed a needle into the C4-5 disk space and took an intraoperative x-ray and confirmed that I was at the appropriate level.  Once this was confirmed, I then mobilized the paraspinal muscle from using bipolar electrocautery from the midbody of C4 to the midbody of C6.  Once this was completed, I took down the anterior osteophyte from the C5-6 and C4-5 vertebral levels using a double-action Leksell rongeur.  Once this was completed, I placed Caspar retracting blades underneath the longus colli muscle.  I deflated endotracheal cuff and expanded to the appropriate width and then reinflated the endotracheal cuff.  An annulotomy at C5-6 was performed and  using pituitary rongeurs, I removed the bulk of the disk material.  I then trimmed down the osteophyte from the inferior aspect of the C5 vertebral body using 2 and 3 mm Kerrison punch.  Distraction pins were placed into the C5 and C6 vertebral bodies.  I distracted the intervertebral space and maintained the distraction with the pins.  I continued to work posteriorly, removing the posterior annulus with fine curettes.  Using a fine nerve hook, I developed a plane underneath the PLL  and used my 1 mm Kerrison to resect the PLL.  This allowed me to remove the osteophytes as well from the inferior aspects of C5 and C6 vertebral bodies.  I also was able to undercut the uncovertebral joints and further decompress the neural elements.  At this point, I was pleased with the overall decompression.  I rasped the endplates to ensure I had removed all of the cartilage from the endplates and then measured.  I placed a size 7 medium lordotic Titan titanium cage.  This obtained excellent purchase. There was an excellent depth and restoration of lordosis.  I then repositioned my retractors and I exposed the C4-5 level.  Using the same technique, I performed a similar diskectomy at this level. After distracting the space, I was able to use my nerve hook to deliver 2 small central disk fragments.  This allowed me to develop the plane underneath the PLL and again resected with my 1 mm Kerrison punch.  At this point, I irrigated the wound copiously with normal saline and made sure I had hemostasis.  I used a trial and placed the 8 mm Titan titanium cage, packed with DBX mix.  I then removed the retractors and then placed a 34 mm contour plate and affixed it with self-drilling screws, 2 at each level.  All screws had excellent purchase.  I then locked all the screws to the plate according to manufacture's standards. I irrigated the wound copiously with normal saline and made sure I had hemostasis.  I then checked the esophagus to ensure that has not inadvertently become entrapped beneath the plate.  Once this was confirmed, I returned the esophagus and the trachea to midline.  I then closed the platysma with interrupted 2-0 Vicryl suture.  Because of the depth, I did place an another layer of just a few 2-0 Vicryl sutures to reapproximate and 3-0 Monocryl for the skin.  Steri-Strips and dry dressing were applied.  A hard collar was applied.  She was ultimately extubated and transferred  to the PACU without incident.  At the end of the case, all needle and sponge counts were correct.  There were no adverse intraoperative events.     Onie Kasparek D. Rolena Infante, M.D.     DDB/MEDQ  D:  10/27/2014  T:  10/28/2014  Job:  [id number]  cc:   Duane Lope D. Rolena Infante, M.D.'s Office

## 2014-10-28 NOTE — Progress Notes (Signed)
Late entry:  On 9/7, RN offered pt to contact respiratory to set up CPAP.  Pt refused and wanted to wear the nasal cannula oxygen.  Oxygen saturations checked throughout the night and pt was in no distress.

## 2014-10-28 NOTE — Evaluation (Addendum)
Physical Therapy Evaluation Patient Details Name: [name] MRN: [id number] DOB: Sep 24, 1950 Today's Date: 10/28/2014   History of Present Illness  s/p cervical diskectomy and fusion, C4-5, C5-6  Clinical Impression  Patient is seen following the above procedure and presents with functional limitations due to the deficits listed below (see PT Problem List). Very pleasant and eager to get out of bed this AM. Ambulates generally well with minimal use of rolling walker for support. No overt loss of balance noted and pt was able to navigate several steps without physical assist. States she will have assistance from daughter and fiance at d/c, as needed. Anticipate she will progress very quickly. Patient will benefit from skilled PT to increase their independence and safety with mobility to allow discharge to the venue listed below.       Follow Up Recommendations Outpatient PT when cleared by MD;Supervision for mobility/OOB initially    Equipment Recommendations  None recommended by PT   Recommendations for Other Services       Precautions / Restrictions Precautions Precautions: Cervical Precaution Comments: reviewed Required Braces or Orthoses: Cervical Brace Cervical Brace: Hard collar Restrictions Weight Bearing Restrictions: No      Mobility  Bed Mobility Overal bed mobility: Needs Assistance Bed Mobility: Rolling;Sidelying to Sit Rolling: Min guard Sidelying to sit: Min assist       General bed mobility comments: pt reports she understands and is comfortable with logrolling technique. Pt in recliner at start and end fo session.  Transfers Overall transfer level: Needs assistance Equipment used: Rolling walker (2 wheeled) Transfers: Sit to/from Stand Sit to Stand: Supervision         General transfer comment: cues for hand placement. Supervision for safety.  Ambulation/Gait Ambulation/Gait assistance: Supervision Ambulation Distance (Feet): 300 Feet Assistive  device: Rolling walker (2 wheeled) Gait Pattern/deviations: Step-through pattern;Decreased stride length Gait velocity: decreased Gait velocity interpretation: Below normal speed for age/gender General Gait Details: Educated on safe DME use with a rolling walker. Cues to maintain precautions by preventing turning of head. Occasional cues for walker placement for proximity.  Stairs Stairs: Yes Stairs assistance: Min guard Stair Management: One rail Right;Step to pattern;Sideways;Forwards Number of Stairs: 2 (x2) General stair comments: Practiced stair navigation techniques with cues for sequencing. Performed forwards and sideways; pt prefers sideways technique and was able to perform without loss of balance or need for physical assist.  Wheelchair Mobility    Modified Rankin (Stroke Patients Only)       Balance Overall balance assessment: Needs assistance Sitting-balance support: No upper extremity supported;Feet supported Sitting balance-Leahy Scale: Good     Standing balance support: Bilateral upper extremity supported;During functional activity Standing balance-Leahy Scale: Fair Standing balance comment: stood at sink to complete hand washing. No LOB or swaying.                  Standardized Balance Assessment Standardized Balance Assessment :  (Simultaneous filing. User may not have seen previous data.)           Pertinent Vitals/Pain Pain Assessment: 0-10 Pain Score: 9  Pain Location: anterior neck Pain Descriptors / Indicators: Aching;Constant Pain Intervention(s): Limited activity within patient's tolerance;Monitored during session;Repositioned    Home Living Family/patient expects to be discharged to:: Private residence Living Arrangements: Children;Spouse/significant other Available Help at Discharge: Family;Available 24 hours/day Type of Home: Mobile home Home Access: Stairs to enter Entrance Stairs-Rails: Right;Left Entrance Stairs-Number of Steps:  4 Home Layout: One level Home Equipment: Walker - 4 wheels;Shower seat  Prior Function Level of Independence: Independent         Comments: Pt lives with fiance and daughter. Daughter has chronic medical issues.      Hand Dominance   Dominant Hand: Right    Extremity/Trunk Assessment   Upper Extremity Assessment: Overall WFL for tasks assessed           Lower Extremity Assessment: Defer to PT evaluation      Cervical / Trunk Assessment:  (s/p cervical diskectomy and fusion, C4-5, C5-6)  Communication   Communication: No difficulties  Cognition Arousal/Alertness: Awake/alert Behavior During Therapy: WFL for tasks assessed/performed Overall Cognitive Status: Within Functional Limits for tasks assessed                      General Comments General comments (skin integrity, edema, etc.): Reviewed application of cervical collar. Discussed safety with mobility, home environment and self care techniques for discharge. Reports family can provide 24/7 care as needed. SpO2 maintained 96% and higher on room air throughout therapy session. HR in upper 90s.    Exercises        Assessment/Plan    PT Assessment Patient needs continued PT services  PT Diagnosis Difficulty walking;Generalized weakness;Acute pain   PT Problem List Decreased strength;Decreased activity tolerance;Decreased balance;Decreased mobility;Decreased knowledge of use of DME;Decreased knowledge of precautions;Pain  PT Treatment Interventions DME instruction;Gait training;Stair training;Functional mobility training;Therapeutic activities;Therapeutic exercise;Balance training;Neuromuscular re-education;Patient/family education;Modalities   PT Goals (Current goals can be found in the Care Plan section) Acute Rehab PT Goals Patient Stated Goal: not stated PT Goal Formulation: With patient Time For Goal Achievement: 11/11/14 Potential to Achieve Goals: Good    Frequency Min 5X/week   Barriers  to discharge        Co-evaluation               End of Session Equipment Utilized During Treatment: Cervical collar Activity Tolerance: Patient tolerated treatment well Patient left: in chair;with call bell/phone within reach Nurse Communication: Mobility status         Time: 1660-6301 PT Time Calculation (min) (ACUTE ONLY): 48 min   Charges:   PT Evaluation $Initial PT Evaluation Tier I: 1 Procedure PT Treatments $Gait Training: 8-22 mins $Self Care/Home Management: 8-22   PT G Codes:        Ellouise Newer 10/28/2014, 1:45 PM Elayne Snare, [place]

## 2014-10-28 NOTE — Progress Notes (Signed)
Occupational Therapy Evaluation Patient Details Name: [name] MRN: [id number] DOB: 1950/10/15 Today's Date: 10/28/2014    History of Present Illness s/p cervical diskectomy and fusion, C4-5, C5-6   Clinical Impression   Pt admitted with the above diagnoses and presents with below problem list. Pt will benefit from continued acute OT to address the below listed deficits and maximize independence with BADLs prior to d/c home. PTA pt was independent with ADLs. Pt is currently min guard to min A with LB ADLs. Pt has family and fiance assisting at d/c. OT to continue to follow acutely.      Follow Up Recommendations  Supervision/Assistance - 24 hour;No OT follow up    Equipment Recommendations  None recommended by OT    Recommendations for Other Services       Precautions / Restrictions Precautions Precautions: Cervical Precaution Comments: reviewed Required Braces or Orthoses: Cervical Brace Cervical Brace: Hard collar Restrictions Weight Bearing Restrictions: No      Mobility Bed Mobility Overal bed mobility: Needs Assistance Bed Mobility: Rolling;Sidelying to Sit Rolling: Min guard Sidelying to sit: Min assist       General bed mobility comments: pt reports she understands and is comfortable with logrolling technique. Pt in recliner at start and end fo session.  Transfers Overall transfer level: Needs assistance Equipment used: Rolling walker (2 wheeled) Transfers: Sit to/from Stand Sit to Stand: Supervision         General transfer comment: cues for hand placement. Supervision for safety.    Balance Overall balance assessment: Needs assistance Sitting-balance support: No upper extremity supported;Feet supported Sitting balance-Leahy Scale: Good     Standing balance support: Bilateral upper extremity supported;During functional activity Standing balance-Leahy Scale: Fair Standing balance comment: stood at sink to complete hand washing. No LOB or  swaying.                  Standardized Balance Assessment Standardized Balance Assessment :  (Simultaneous filing. User may not have seen previous data.)          ADL Overall ADL's : Needs assistance/impaired Eating/Feeding: Set up;Sitting   Grooming: Wash/dry hands;Min guard;Standing Grooming Details (indicate cue type and reason): discussed AE for oral care to avoid bending (2 cups) Upper Body Bathing: Set up;Sitting Upper Body Bathing Details (indicate cue type and reason): Pt reports MD has educated her on brace management during showering: brace off for showers.  Lower Body Bathing: Sit to/from stand;Minimal assistance Lower Body Bathing Details (indicate cue type and reason): difficulty accessing feet in seated position; discussed AE option. Pt plans to have family assist.  Upper Body Dressing : Set up   Lower Body Dressing: Sit to/from stand;Minimal assistance Lower Body Dressing Details (indicate cue type and reason): difficulty accessing feet in seated position; discussed AE option. Pt plans to have family assist Toilet Transfer: Min guard;Ambulation;Regular Toilet;Grab bars;RW   Toileting- Clothing Manipulation and Hygiene: Sitting/lateral lean;Supervision/safety   Tub/ Shower Transfer: Walk-in shower;Min guard;3 in English as a second language teacher;Shower Scientist, research (medical) Details (indicate cue type and reason): educated on using shower seat at d/c  Functional mobility during ADLs: Min guard;Rolling walker General ADL Comments: Pt completed toilet transfer, pericare, grooming, and in-room functional mobility as detailed above. Discussed AE and compensatory strategies for ADLs with cervical precautions. Discussed safety with home setup.      Vision     Perception     Praxis      Pertinent Vitals/Pain Pain Assessment: 0-10 Pain Score: 9  Pain Location: anterior neck  Pain Descriptors / Indicators: Aching;Constant Pain Intervention(s): Limited activity within patient's  tolerance;Monitored during session;Repositioned     Hand Dominance Right   Extremity/Trunk Assessment Upper Extremity Assessment Upper Extremity Assessment: Overall WFL for tasks assessed   Lower Extremity Assessment Lower Extremity Assessment: Defer to PT evaluation   Cervical / Trunk Assessment Cervical / Trunk Assessment:  (s/p cervical diskectomy and fusion, C4-5, C5-6)   Communication Communication Communication: No difficulties   Cognition Arousal/Alertness: Awake/alert Behavior During Therapy: WFL for tasks assessed/performed Overall Cognitive Status: Within Functional Limits for tasks assessed                     General Comments       Exercises       Shoulder Instructions      Home Living Family/patient expects to be discharged to:: Private residence Living Arrangements: Children;Spouse/significant other Available Help at Discharge: Family;Available 24 hours/day Type of Home: Mobile home Home Access: Stairs to enter Entrance Stairs-Number of Steps: 4 Entrance Stairs-Rails: Right;Left Home Layout: One level     Bathroom Shower/Tub: Occupational psychologist: Standard Bathroom Accessibility: Yes How Accessible: Accessible via walker Home Equipment: [place] - 4 wheels;Shower seat          Prior Functioning/Environment Level of Independence: Independent        Comments: Pt lives with fiance and daughter. Daughter has chronic medical issues.     OT Diagnosis: Acute pain   OT Problem List: Impaired balance (sitting and/or standing);Decreased knowledge of use of DME or AE;Decreased knowledge of precautions;Pain   OT Treatment/Interventions: Self-care/ADL training;DME and/or AE instruction;Therapeutic activities;Patient/family education;Balance training    OT Goals(Current goals can be found in the care plan section) Acute Rehab OT Goals Patient Stated Goal: not stated OT Goal Formulation: With patient Time For Goal Achievement:  11/04/14 Potential to Achieve Goals: Good ADL Goals Pt Will Perform Grooming: with modified independence;with adaptive equipment;standing Pt Will Perform Lower Body Bathing: with modified independence;with adaptive equipment;sit to/from stand Pt Will Perform Lower Body Dressing: with modified independence;with adaptive equipment;sit to/from stand Pt Will Transfer to Toilet: with modified independence;ambulating;regular height toilet;grab bars Pt Will Perform Toileting - Clothing Manipulation and hygiene: with modified independence;with adaptive equipment;sitting/lateral leans;sit to/from stand Pt Will Perform Tub/Shower Transfer: Shower transfer;with modified independence;ambulating;shower seat;rolling walker  OT Frequency: Min 2X/week   Barriers to D/C:            Co-evaluation              End of Session Equipment Utilized During Treatment: Rolling walker;Cervical collar  Activity Tolerance: Patient tolerated treatment well Patient left: in chair;with call bell/phone within reach   Time: 1256-1325 OT Time Calculation (min): 29 min Charges:  OT General Charges $OT Visit: 1 Procedure OT Evaluation $Initial OT Evaluation Tier I: 1 Procedure OT Treatments $Self Care/Home Management : 8-22 mins G-Codes:    [name] 03-Nov-2014, 1:45 PM

## 2014-10-28 NOTE — Progress Notes (Signed)
Patient ID: [name], female   DOB: November 14, 1950, 64 y.o.   MRN: [id number]    Subjective: 1 Day Post-Op Procedure(s) (LRB): ANTERIOR CERVICAL DECOMPRESSION/DISCECTOMY FUSION C4 - C6   2 LEVELS (N/A) Patient reports pain as 5 on 0-10 scale.   Pt reports CP with breathing.  Voiding without difficulty. No flatus. Objective: Vital signs in last 24 hours: Temp:  [97.8 F (36.6 C)-98.6 F (37 C)] 97.8 F (36.6 C) (09/08 0659) Pulse Rate:  [68-102] 85 (09/08 0659) Resp:  [12-21] 20 (09/08 0659) BP: (117-183)/(71-99) 167/99 mmHg (09/08 0659) SpO2:  [97 %-100 %] 100 % (09/08 0659)  Intake/Output from previous day: 09/07 0701 - 09/08 0700 In: 1200 [I.V.:1200] Out: 4425 [Urine:4375; Blood:50] Intake/Output this shift: Total I/O In: 3 [I.V.:3] Out: -   Labs:  Recent Labs  10/27/14 0955  HGB 14.3    Recent Labs  10/27/14 0955  WBC 12.3*  RBC 4.65  HCT 41.2  PLT 217    Recent Labs  10/27/14 0955  NA 136  K 4.0  CL 102  CO2 25  BUN 20  CREATININE 0.73  GLUCOSE 101*  CALCIUM 9.0   No results for input(s): LABPT, INR in the last 72 hours.  Physical Exam: Neurologically intact ABD soft Intact pulses distally Dorsiflexion/Plantar flexion intact Incision: dressing C/D/I Compartment soft  Assessment/Plan: 1 Day Post-Op Procedure(s) (LRB): ANTERIOR CERVICAL DECOMPRESSION/DISCECTOMY FUSION C4 - C6   2 LEVELS (N/A) Advance diet Up with therapy Ordered ECG to assess CP  [place], Darla Lesches for Dr. Melina Schools [facility] [phone] 10/28/2014, 10:34 AM

## 2014-10-28 NOTE — Progress Notes (Signed)
[name] requesting I put PT/OT orders in for PT/OT evaluation orders are placed

## 2014-10-28 NOTE — Clinical Social Work Note (Signed)
CSW Consult Acknowledged:   CSW received a consult for SNF placement. CSW awaiting PT/OT evaluation to determine the appropriate level of care.      Brylen Wagar, MSW, LCSWA [phone]  

## 2014-10-28 NOTE — Progress Notes (Signed)
Utilization review completed. Sharne Linders, RN, BSN. 

## 2014-10-29 ENCOUNTER — Inpatient Hospital Stay (HOSPITAL_COMMUNITY): Payer: Medicare Other

## 2014-10-29 DIAGNOSIS — R070 Pain in throat: Secondary | ICD-10-CM

## 2014-10-29 NOTE — Progress Notes (Signed)
CT scan reviewed: hematoma noted - no tracheal deviation.  No hardware complications No clinical signs of infection Patient cleared by cardiology - will check troponin level  Plan:  Ice for swelling,  Mobilization with PT  Will continue to monitor exam

## 2014-10-29 NOTE — Progress Notes (Signed)
Pt ambulated with this nurse to the bathroom stand by assist.  Gait steady. No noted distress. Pt denies SOB. Will continue to monitor.

## 2014-10-29 NOTE — Progress Notes (Signed)
At 0615 pt complained of chest pain only when swallowing.  Pt stated she had chest pain yesterday morning also.  EKG performed and showed abnormal EKG with widened QRS.  Rapid consulted to look at EKG.  On call MD paged and ordered RN to give morphine and stated he will contact Dr. Rolena Infante.  Pt is sitting up in chair wearing 3 Liters O2 nasal cannula. 4 mg morphine given.  Will continue to closely monitor.   Fredrich Romans, RN

## 2014-10-29 NOTE — Progress Notes (Signed)
Called  At 0630 per floor RN regarding Pt complaints this AM of chest pain with swallowing only. EKG completed prior to my arrival resulting abnormal with an abnormal QRS slightly widened. ST elevation not assessed. Pt found resting in bed, denies SOB, denies CP at rest, only with swallowing. Pt awake alert follows commands. RR 15, Po2 94-95% on RA, no respiratory distress, placed on 3 LNC.  Anterior neck dressing intact, site soft non distended. Dr. Crissie Figures on call for Dr. Rolena Infante paged and updated on EKG results and Pt CP with swallowing. Dr. Rolena Infante to see Pt this am and review EKG. RN advised to give Pt 4 mg Morphine as ordered PRN and monitor Pt closely.

## 2014-10-29 NOTE — Progress Notes (Addendum)
Physical Therapy Treatment Patient Details Name: [name] MRN: [id number] DOB: 12-30-1950 Today's Date: 10/29/2014    History of Present Illness 64 y.o. female s/p ANTERIOR CERVICAL DECOMPRESSION/DISCECTOMY FUSION C4 - C6 2 LEVELS     PT Comments    Patient a bit more lethargic today but willing to participate with physical therapy. Ambulating slowly but steadily with a rolling walker for support. Performs bed mobility and transfer without physical assist however requires extra time due to pain. Reviewed log roll technique to maintain neutral cervical alignment. Patient will continue to benefit from skilled physical therapy services to further improve independence with functional mobility.   Follow Up Recommendations  Supervision for mobility/OOB (Outpatient PT when cleared by MD)     Equipment Recommendations  None recommended by PT    Recommendations for Other Services       Precautions / Restrictions Precautions Precautions: Cervical Precaution Comments: Reviewed Required Braces or Orthoses: Cervical Brace Cervical Brace: Hard collar Restrictions Weight Bearing Restrictions: No    Mobility  Bed Mobility Overal bed mobility: Needs Assistance Bed Mobility: Rolling;Sidelying to Sit;Sit to Sidelying Rolling: Min guard Sidelying to sit: Min assist     Sit to sidelying: Min guard General bed mobility comments: Required significant time to exit bed. Min assist for truncal support to rise from sidelying, min guard to return to sidelying from seated position. Cues throughout and use of rail.  Transfers Overall transfer level: Needs assistance Equipment used: Rolling walker (2 wheeled) Transfers: Sit to/from Stand Sit to Stand: Min guard         General transfer comment: Close guard for safety. VC for hand placement prior to rising and required extra time.  Ambulation/Gait Ambulation/Gait assistance: Min guard Ambulation Distance (Feet): 225 Feet Assistive  device: Rolling walker (2 wheeled) Gait Pattern/deviations: Step-through pattern;Decreased stride length Gait velocity: slow Gait velocity interpretation: <1.8 ft/sec, indicative of risk for recurrent falls General Gait Details: Slower speed today. VC for walker placement for proximity. No loss of balance. Required 2 standing rest breaks due to pain when swallowing.    Stairs            Wheelchair Mobility    Modified Rankin (Stroke Patients Only)       Balance                                    Cognition Arousal/Alertness: Awake/alert Behavior During Therapy: WFL for tasks assessed/performed Overall Cognitive Status: Within Functional Limits for tasks assessed                      Exercises      General Comments        Pertinent Vitals/Pain Pain Assessment: Faces Faces Pain Scale: Hurts whole lot Pain Location: Throat when I swallow Pain Descriptors / Indicators: Sharp ("hurts") Pain Intervention(s): Monitored during session;Repositioned    Home Living                      Prior Function            PT Goals (current goals can now be found in the care plan section) Acute Rehab PT Goals Patient Stated Goal: Be able to walk for exercises in order to lose weight and play with great grandkids PT Goal Formulation: With patient Time For Goal Achievement: 11/11/14 Potential to Achieve Goals: Good Progress towards PT goals: Progressing toward goals  Frequency  Min 5X/week    PT Plan Current plan remains appropriate    Co-evaluation             End of Session Equipment Utilized During Treatment: Cervical collar Activity Tolerance: Patient tolerated treatment well Patient left: with call bell/phone within reach;in bed;with bed alarm set     Time: 5885-0277 PT Time Calculation (min) (ACUTE ONLY): 28 min  Charges:  $Gait Training: 8-22 mins $Therapeutic Activity: 8-22 mins                    G Codes:       Ellouise Newer 11/02/2014, 3:59 PM Camille Bal [place], [place]

## 2014-10-29 NOTE — Consult Note (Signed)
CONSULT NOTE  Date: 10/29/2014               Patient Name:  [name] MRN: [id number]  DOB: 04-30-1950 Age / Sex: 64 y.o., female        PCP: [name] Primary Cardiologist: New/Nasher            Referring Physician: Trenton Gammon  MD              Reason for Consult: Chest pain following back surgery            History of Present Illness: Patient is a 64 y.o. female with a PMHx of back pain , who was admitted to [facility] on 10/27/2014 for evaluation of back pain . Marland Kitchen   The patient had spinal surgery through an anterior access yesterday.    She complained of having anterior throat pain . The pain is centered right around her incision.   It is greatly exacerbated by swallowing. She's not been able to ambulate. she's never had any episodes of chest pain previously. She does have a history of asthma and COPD. She has never smoked.   EKG obtained this morning shows no acute abnormalities. There is no QRS widening. There is no evidence of ST segment changes.  Medications: Outpatient medications: Prescriptions prior to admission  Medication Sig Dispense Refill Last Dose  . albuterol (PROVENTIL HFA;VENTOLIN HFA) 108 (90 BASE) MCG/ACT inhaler Inhale 2 puffs into the lungs every 6 (six) hours as needed for wheezing or shortness of breath.   10/26/2014 at Unknown time  . atorvastatin (LIPITOR) 80 MG tablet Take 80 mg by mouth daily.   10/26/2014 at Unknown time  . budesonide-formoterol (SYMBICORT) 160-4.5 MCG/ACT inhaler Inhale 2 puffs into the lungs 2 (two) times daily.   10/27/2014 at 0730  . diazepam (VALIUM) 5 MG tablet Take 5 mg by mouth every 8 (eight) hours as needed for anxiety.   10/27/2014 at 0100  . diclofenac (VOLTAREN) 75 MG EC tablet Take 75 mg by mouth 2 (two) times daily.   Past Week at Unknown time  . DULoxetine (CYMBALTA) 30 MG capsule Take 30 mg by mouth 2 (two) times daily.   10/27/2014 at 0730  . famotidine (PEPCID) 20 MG tablet Take 20 mg by mouth 2 (two) times daily.    10/27/2014 at 0730  . fluticasone (FLONASE) 50 MCG/ACT nasal spray Place 2 sprays into both nostrils daily.   Past Week at Unknown time  . gabapentin (NEURONTIN) 300 MG capsule Take 900 mg by mouth 2 (two) times daily.    10/27/2014 at 0730  . hydrochlorothiazide (HYDRODIURIL) 25 MG tablet Take 25 mg by mouth every other day.    10/27/2014 at 0730  . levothyroxine (SYNTHROID, LEVOTHROID) 125 MCG tablet Take 125 mcg by mouth daily before breakfast.   10/27/2014 at 0730  . lisinopril (PRINIVIL,ZESTRIL) 10 MG tablet Take 10 mg by mouth 2 (two) times daily.    10/27/2014 at 0730  . loratadine (CLARITIN) 10 MG tablet Take 10 mg by mouth daily.   10/26/2014 at Unknown time  . metoprolol succinate (TOPROL-XL) 50 MG 24 hr tablet Take 50 mg by mouth 2 (two) times daily. Take with or immediately following a meal.   10/27/2014 at 0730  . oxyCODONE-acetaminophen (PERCOCET/ROXICET) 5-325 MG per tablet Take 2 tablets by mouth every 6 (six) hours as needed for severe pain.    10/27/2014 at 0730  . butalbital-acetaminophen-caffeine (FIORICET) 50-325-40 MG per tablet Take 1  tablet by mouth every 6 (six) hours as needed for headache. (Patient not taking: Reported on 10/22/2014) 20 tablet 0 Not Taking at Unknown time  . nitroGLYCERIN (NITROSTAT) 0.4 MG SL tablet Place 0.4 mg under the tongue every 5 (five) minutes as needed for chest pain.   Unknown at Unknown time    Current medications: Current Facility-Administered Medications  Medication Dose Route Frequency Provider Last Rate Last Dose  . 0.9 %  sodium chloride infusion  250 mL Intravenous Continuous Melina Schools, MD      . albuterol (PROVENTIL) (2.5 MG/3ML) 0.083% nebulizer solution 3 mL  3 mL Inhalation Q6H PRN Melina Schools, MD      . budesonide-formoterol (SYMBICORT) 160-4.5 MCG/ACT inhaler 2 puff  2 puff Inhalation BID Melina Schools, MD   2 puff at 10/29/14 0851  . DULoxetine (CYMBALTA) DR capsule 30 mg  30 mg Oral BID Melina Schools, MD   30 mg at 10/29/14 1032  .  fluticasone (FLONASE) 50 MCG/ACT nasal spray 2 spray  2 spray Each Nare Daily Melina Schools, MD   2 spray at 10/29/14 1033  . gabapentin (NEURONTIN) capsule 900 mg  900 mg Oral BID Melina Schools, MD   900 mg at 10/28/14 2157  . hydrochlorothiazide (HYDRODIURIL) tablet 25 mg  25 mg Oral Leroy Kennedy, MD   25 mg at 10/29/14 1032  . lactated ringers infusion   Intravenous Continuous Melina Schools, MD 85 mL/hr at 10/28/14 0153    . levothyroxine (SYNTHROID, LEVOTHROID) tablet 125 mcg  125 mcg Oral QAC breakfast Melina Schools, MD   125 mcg at 10/29/14 1033  . lisinopril (PRINIVIL,ZESTRIL) tablet 10 mg  10 mg Oral BID Melina Schools, MD   10 mg at 10/29/14 1033  . menthol-cetylpyridinium (CEPACOL) lozenge 3 mg  1 lozenge Oral PRN Melina Schools, MD   3 mg at 10/28/14 2158   Or  . phenol (CHLORASEPTIC) mouth spray 1 spray  1 spray Mouth/Throat PRN Melina Schools, MD   1 spray at 10/28/14 0859  . methocarbamol (ROBAXIN) tablet 500 mg  500 mg Oral Q6H PRN Melina Schools, MD   500 mg at 10/29/14 0123   Or  . methocarbamol (ROBAXIN) 500 mg in dextrose 5 % 50 mL IVPB  500 mg Intravenous Q6H PRN Melina Schools, MD      . metoprolol succinate (TOPROL-XL) 24 hr tablet 50 mg  50 mg Oral BID WC Melina Schools, MD   50 mg at 10/29/14 1033  . morphine 2 MG/ML injection 1-4 mg  1-4 mg Intravenous Q3H PRN Melina Schools, MD   4 mg at 10/29/14 0700  . nitroGLYCERIN (NITROSTAT) SL tablet 0.4 mg  0.4 mg Sublingual Q5 min PRN Melina Schools, MD      . ondansetron [facility]) injection 4 mg  4 mg Intravenous Q4H PRN Melina Schools, MD      . oxyCODONE (Oxy IR/ROXICODONE) immediate release tablet 5-10 mg  5-10 mg Oral Q4H PRN Melina Schools, MD   10 mg at 10/29/14 0122  . sodium chloride 0.9 % injection 3 mL  3 mL Intravenous Q12H Melina Schools, MD   3 mL at 10/28/14 2204  . sodium chloride 0.9 % injection 3 mL  3 mL Intravenous PRN Melina Schools, MD         Allergies  Allergen Reactions  . Bee Venom Anaphylaxis  . Codeine  Nausea Only    Pt reported nausea to nurse when asked about allergy on 10/27/14  . Hydrocodone-Acetaminophen Other (See  Comments)    Halluninations  . Procaine Hcl   . Tramadol     Other reaction(s): NAUSEA  . Latex Rash     Past Medical History  Diagnosis Date  . Hypertension   . COPD (chronic obstructive pulmonary disease)   . Heart murmur     Long time ago-  not now  . Asthma   . Shortness of breath dyspnea     with exertion  . Anxiety     Panic attacks- in past.  . GERD (gastroesophageal reflux disease)   . Sleep apnea     wears CPAP    Past Surgical History  Procedure Laterality Date  . Knee arthrocentesis Left     x2  . Wrist arthroplasty Left     Ulna shorter   . Abdominal hysterectomy    . Carpal tunnel release Bilateral   . Cardiac catheterization  06/15/14  . Anterior cervical decomp/discectomy fusion N/A 10/27/2014    Procedure: ANTERIOR CERVICAL DECOMPRESSION/DISCECTOMY FUSION C4 - C6   2 LEVELS;  Surgeon: Melina Schools, MD;  Location: [place];  Service: Orthopedics;  Laterality: N/A;    History reviewed. No pertinent family history.  Social History:  reports that she has never smoked. She does not have any smokeless tobacco history on file. She reports that she drinks alcohol. She reports that she does not use illicit drugs.   Review of Systems: Constitutional:  denies fever, chills, diaphoresis, appetite change and fatigue.  HEENT: denies photophobia, eye pain, redness, hearing loss, ear pain, congestion, sore throat, rhinorrhea, sneezing, neck pain, neck stiffness and tinnitus.  Respiratory: denies SOB, DOE, cough, chest tightness, and wheezing.  Cardiovascular: denies chest pain, palpitations and leg swelling.  Gastrointestinal: denies nausea, vomiting, abdominal pain, diarrhea, constipation, blood in stool.  She has throat pain with swallowing .   Genitourinary: denies dysuria, urgency, frequency, hematuria, flank pain and difficulty urinating.    Musculoskeletal: denies  myalgias, back pain, joint swelling, arthralgias and gait problem.   Skin: denies pallor, rash and wound.  Neurological: denies dizziness, seizures, syncope, weakness, light-headedness, numbness and headaches.   Hematological: denies adenopathy, easy bruising, personal or family bleeding history.  Psychiatric/ Behavioral: denies suicidal ideation, mood changes, confusion, nervousness, sleep disturbance and agitation.    Physical Exam: BP 150/76 mmHg  Pulse 111  Temp(Src) 97.9 F (36.6 C) (Oral)  Resp 18  Wt 93.895 kg (207 lb)  SpO2 97%  Wt Readings from Last 3 Encounters:  10/27/14 93.895 kg (207 lb)  10/06/14 93.895 kg (207 lb)  08/04/14 90.719 kg (200 lb)    General: Vital signs reviewed and noted. Well-developed, well-nourished, in no acute distress; alert,   Head: Normocephalic, atraumatic, sclera anicteric,   Has a neck brace on.   Neck: Supple. Negative for carotid bruits. No JVD   Lungs:  Clear bilaterally, no  wheezes, rales, or rhonchi. Breathing is normal   Heart: RRR with S1 S2. No murmurs, rubs, or gallops   Abdomen/ GI :  Soft, non-tender, non-distended with normoactive bowel sounds. No hepatomegaly. No rebound/guarding. No obvious abdominal masses   MSK: Strength and the appear normal for age.   Extremities: No clubbing or cyanosis. No edema.  Distal pedal pulses are 2+ and equal   Neurologic:  CN are grossly intact,  No obvious motor or sensory defect.  Alert and oriented X 3. Moves all extremities spontaneously.  Psych: Responds to questions appropriately with a normal affect.     Lab results: Basic Metabolic Panel:  Recent Labs Lab 10/27/14 0955  NA 136  K 4.0  CL 102  CO2 25  GLUCOSE 101*  BUN 20  CREATININE 0.73  CALCIUM 9.0    Liver Function Tests: No results for input(s): AST, ALT, ALKPHOS, BILITOT, PROT, ALBUMIN in the last 168 hours. No results for input(s): LIPASE, AMYLASE in the last 168 hours. No results for  input(s): AMMONIA in the last 168 hours.  CBC:  Recent Labs Lab 10/27/14 0955  WBC 12.3*  HGB 14.3  HCT 41.2  MCV 88.6  PLT 217    Cardiac Enzymes: No results for input(s): CKTOTAL, CKMB, CKMBINDEX, TROPONINI in the last 168 hours.  BNP: Invalid input(s): POCBNP  CBG: No results for input(s): GLUCAP in the last 168 hours.  Coagulation Studies: No results for input(s): LABPROT, INR in the last 72 hours.   Other results:  Personal review of EKG shows : NSR . No ST or T wave changes.   No QRS widening    Imaging: Dg Cervical Spine 2 Or 3 Views  10/27/2014   CLINICAL DATA:  64 year old female with cervical fusion  EXAM: CERVICAL SPINE - 2-3 VIEW  COMPARISON:  Intraoperative radiograph dated 10/27/2014  FINDINGS: Evaluation is limited due to artifact caused by soft tissues of the neck. The C7 vertebra is not visualized on the lateral image. C4-C6 anterior fixation plate and screws and disc spacers noted. There is normal cervical lordosis. No acute fracture. There is prominence of the anterior spinal soft tissues, likely related to postoperative edema. Underlying hematoma or fluid collection is not excluded. Follow-up recommended.  IMPRESSION: Postsurgical changes of C4-C6 fixation.   Electronically Signed   By: Anner Crete M.D.   On: 10/27/2014 23:48   Dg Cervical Spine 2-3 Views  10/27/2014   CLINICAL DATA:  Surgical anterior cervical disc fusion.  EXAM: CERVICAL SPINE - 2-3 VIEW  COMPARISON:  Same day.  FLUOROSCOPY TIME:  36 seconds.  FINDINGS: Three intraoperative fluoroscopic images were obtained of the cervical spine. These demonstrate the patient be status post surgical anterior fusion of C4 through C6 with good alignment of vertebral bodies. Interbody fusion is noted.  IMPRESSION: Status post surgical anterior fusion of C4 through C6.   Electronically Signed   By: Marijo Conception, M.D.   On: 10/27/2014 15:44   Ct Soft Tissue Neck Wo Contrast  10/29/2014   CLINICAL DATA:   Neck swelling. Anterior cervical decompression/ discectomy with fusion at C4 through C6 2 days ago.  EXAM: CT NECK WITHOUT CONTRAST  TECHNIQUE: Multidetector CT imaging of the neck was performed following the standard protocol without intravenous contrast.  COMPARISON:  Radiographs dated 10/27/2014  FINDINGS: There is a poorly defined fluid collection just to the left of the thyroid cartilage measuring approximately 4.5 x 4.0 x 2.4 cm with multiple gas bubbles in the soft tissues extending above the fluid collection. This could represent a postoperative seroma or hematoma. This less likely represent a postoperative abscess.  There is only a small amount of prevertebral soft tissue edema to the expected degree.  Hardware of the anterior cervical fusion appears in excellent position.  IMPRESSION: Focal small postoperative seroma or hematoma just to the left of the thyroid cartilage. This is less likely to represent an abscess. Does the patient have any clinical symptoms of infection?   Electronically Signed   By: Lorriane Shire M.D.   On: 10/29/2014 10:23   Dg C-arm 1-60 Min  10/27/2014   CLINICAL DATA:  Anterior cervical  disc fusion of C4-5 and C5-6.  EXAM: DG C-ARM 61-120 MIN  COMPARISON:  Same day.  FINDINGS: Three intraoperative fluoroscopic images of the cervical spine demonstrate the patient be status post surgical anterior fusion of C4, C5 and C6 with interbody fusion. Good alignment of the vertebral bodies is noted.  IMPRESSION: Status post surgical anterior fusion from C4-C6.   Electronically Signed   By: Marijo Conception, M.D.   On: 10/27/2014 15:38      Assessment & Plan:  1. Throat pain: Patient presents following surgery. She complains of throat pain that is worse with swallowing. The symptoms do not sound cardiac to me. I have reviewed her EKG and there are no acute changes. There is no significant QRS widening. I'll check a troponin level to ensure that that is normal. If the troponin level is  negative then I do not think that she needs any further cardiac evaluation.  We will sign off. Call us for further questions.    Thayer Headings, Brooke Bonito., MD, [facility] 10/29/2014, 11:09 AM Office - [phone] Pager [phone]

## 2014-10-29 NOTE — Plan of Care (Signed)
Problem: Consults Goal: Diagnosis - Spinal Surgery Outcome: Completed/Met Date Met:  10/29/14 Thoraco/Lumbar Spine Fusion     

## 2014-10-29 NOTE — Progress Notes (Signed)
Occupational Therapy Treatment Patient Details Name: [name] MRN: [id number] DOB: March 11, 1950 Today's Date: 10/29/2014    History of present illness 64 y.o. female s/p ANTERIOR CERVICAL DECOMPRESSION/DISCECTOMY FUSION C4 - C6 2 LEVELS    OT comments  Reviewed safety with ADLs and precautions.  Pt limited this pm due to pain shoulders, neck, and chest.  Will continue to follow.   Follow Up Recommendations  Supervision/Assistance - 24 hour;No OT follow up    Equipment Recommendations  None recommended by OT    Recommendations for Other Services      Precautions / Restrictions Precautions Precautions: Cervical Precaution Comments: Pt able to state cervical precautions independently  Required Braces or Orthoses: Cervical Brace Cervical Brace: Hard collar Restrictions Weight Bearing Restrictions: No       Mobility Bed Mobility Overal bed mobility: Needs Assistance Bed Mobility: Rolling;Sidelying to Sit;Sit to Sidelying Rolling: Min guard Sidelying to sit: Min assist     Sit to sidelying: Min guard General bed mobility comments: Required significant time to exit bed. Min assist for truncal support to rise from sidelying, min guard to return to sidelying from seated position. Cues throughout and use of rail.  Transfers Overall transfer level: Needs assistance Equipment used: Rolling walker (2 wheeled) Transfers: Sit to/from Stand Sit to Stand: Min guard         General transfer comment: Close guard for safety. VC for hand placement prior to rising and required extra time.    Balance                                   ADL         Grooming Details (indicate cue type and reason): Pt able to verbalize safe technique      Lower Body Bathing: Supervison/ safety;Sit to/from stand Lower Body Bathing Details (indicate cue type and reason): able to cross ankles over knees      Lower Body Dressing: Supervision/safety;Sit to/from stand   Toilet  Transfer: Modified Independent;Ambulation;Comfort height toilet           Functional mobility during ADLs: Modified independent General ADL Comments: Pt instructed in use of reacher.  Reviewed safe techniques for LB ADLs, cervical precautions, how to change pads, and how to don/doff brace.  Pt deferred actual practice due to elevate pain levels this pm       Vision                     Perception     Praxis      Cognition   Behavior During Therapy: WFL for tasks assessed/performed Overall Cognitive Status: Within Functional Limits for tasks assessed                       Extremity/Trunk Assessment               Exercises     Shoulder Instructions       General Comments      Pertinent Vitals/ Pain       Pain Assessment: Faces Faces Pain Scale: Hurts whole lot Pain Location: shoulders, chest, throat Pain Descriptors / Indicators: Constant;Grimacing;Guarding;Jabbing;Sharp Pain Intervention(s): Monitored during session;Premedicated before session  Home Living  Prior Functioning/Environment              Frequency Min 2X/week     Progress Toward Goals  OT Goals(current goals can now be found in the care plan section)  Progress towards OT goals: Progressing toward goals  Acute Rehab OT Goals Patient Stated Goal: Be able to walk for exercises in order to lose weight and play with great grandkids ADL Goals Pt Will Perform Grooming: with modified independence;with adaptive equipment;standing Pt Will Perform Lower Body Bathing: with modified independence;with adaptive equipment;sit to/from stand Pt Will Perform Lower Body Dressing: with modified independence;with adaptive equipment;sit to/from stand Pt Will Transfer to Toilet: with modified independence;ambulating;regular height toilet;grab bars Pt Will Perform Toileting - Clothing Manipulation and hygiene: with modified  independence;with adaptive equipment;sitting/lateral leans;sit to/from stand Pt Will Perform Tub/Shower Transfer: Shower transfer;with modified independence;ambulating;shower seat;rolling walker  Plan Discharge plan remains appropriate    Co-evaluation                 End of Session Equipment Utilized During Treatment: Cervical collar   Activity Tolerance Patient limited by pain   Patient Left in bed;with call bell/phone within reach   Nurse Communication Mobility status        Time: 4709-6283 OT Time Calculation (min): 9 min  Charges: OT General Charges $OT Visit: 1 Procedure OT Treatments $Self Care/Home Management : 8-22 mins  Rose Hegner M 10/29/2014, 5:06 PM

## 2014-10-29 NOTE — Progress Notes (Signed)
Patient refuses CPAP tonight.

## 2014-10-29 NOTE — Progress Notes (Signed)
    Subjective: Procedure(s) (LRB): ANTERIOR CERVICAL DECOMPRESSION/DISCECTOMY FUSION C4 - C6   2 LEVELS (N/A) 2 Days Post-Op  Patient reports pain as 5 on 0-10 scale.  Reports decreased arm pain reports incisional neck pain   Positive void Negative bowel movement Positive flatus Positive chest pain or shortness of breath  Objective: Vital signs in last 24 hours: Temp:  [96.6 F (35.9 C)-100 F (37.8 C)] 97.9 F (36.6 C) (09/09 0545) Pulse Rate:  [71-111] 111 (09/09 0545) Resp:  [18-20] 18 (09/09 0545) BP: (115-150)/(55-78) 150/76 mmHg (09/09 0545) SpO2:  [94 %-99 %] 97 % (09/09 0545)  Intake/Output from previous day: 09/08 0701 - 09/09 0700 In: 3 [I.V.:3] Out: -   Labs:  Recent Labs  10/27/14 0955  WBC 12.3*  RBC 4.65  HCT 41.2  PLT 217    Recent Labs  10/27/14 0955  NA 136  K 4.0  CL 102  CO2 25  BUN 20  CREATININE 0.73  GLUCOSE 101*  CALCIUM 9.0   No results for input(s): LABPT, INR in the last 72 hours.  Physical Exam: Neurologically intact ABD soft Intact pulses distally Incision: dressing C/D/I Compartment soft swelling noted at incision site.    Assessment/Plan: Patient stable  xrays satisfactory Mobilization with physical therapy Encourage incentive spirometry Continue care  Chest pain: will consult cardiology to evaluate further. CT scan of the neck this AM to evaluate swelling.  Question of edema vs hematoma.    Hold on d/c to home today given issues that have arisen Ice to incision site to address edema has been re-ordered.  Melina Schools, MD [place] [phone]

## 2014-10-29 NOTE — Progress Notes (Signed)
Patient ID: Shresta Risden, female   DOB: 1950-07-15, 64 y.o.   MRN: [id number]    Subjective: 2 Days Post-Op Procedure(s) (LRB): ANTERIOR CERVICAL DECOMPRESSION/DISCECTOMY FUSION C4 - C6   2 LEVELS (N/A) Patient reports pain as 4 on 0-10 scale.   Intermittent CP and SOB.  Pain with swallowing.  Voiding without difficulty. Positive flatus. Rapid response called last night because of chest pain.  Cariology is following. Pt has a hematoma under her incision. Objective: Vital signs in last 24 hours: Temp:  [96.6 F (35.9 C)-100 F (37.8 C)] 97.5 F (36.4 C) (09/09 1300) Pulse Rate:  [71-111] 79 (09/09 1300) Resp:  [18-20] 18 (09/09 1300) BP: (115-150)/(55-76) 135/71 mmHg (09/09 1300) SpO2:  [94 %-99 %] 94 % (09/09 1300)  Intake/Output from previous day: 09/08 0701 - 09/09 0700 In: 3 [I.V.:3] Out: -  Intake/Output this shift:    Labs:  Recent Labs  10/27/14 0955  HGB 14.3    Recent Labs  10/27/14 0955  WBC 12.3*  RBC 4.65  HCT 41.2  PLT 217    Recent Labs  10/27/14 0955  NA 136  K 4.0  CL 102  CO2 25  BUN 20  CREATININE 0.73  GLUCOSE 101*  CALCIUM 9.0   No results for input(s): LABPT, INR in the last 72 hours.  Physical Exam: Neurologically intact ABD soft Sensation intact distally Dorsiflexion/Plantar flexion intact Incision: dressing C/D/I Compartment soft  Assessment/Plan: 2 Days Post-Op Procedure(s) (LRB): ANTERIOR CERVICAL DECOMPRESSION/DISCECTOMY FUSION C4 - C6   2 LEVELS (N/A) Up with therapy  Pt would like to continue soft diet because of pain with swallowing Cariology monitoring pt Consider DC this weekend after cariology signs off Continue to use CPAP at night Continue to use ice over the incision for the hematoma  Annalynn Centanni, Darla Lesches for Dr. Melina Schools [facility] [phone] 10/29/2014, 3:42 PM

## 2014-10-29 NOTE — Progress Notes (Signed)
OT Cancellation Note  Patient Details Name: Sascha Palma MRN: [id number] DOB: [birth year]   Cancelled Treatment:    Reason Eval/Treat Not Completed: Medical issues which prohibited therapy - Pt with CP and abnormal EKG.  Awaiting cardiology consult.  Will resume OT once pt cleared by cards.   Darlina Rumpf Dorr, OTR/L [phone]  10/29/2014, 11:28 AM

## 2014-10-30 NOTE — Progress Notes (Signed)
   Subjective: 3 Days Post-Op Procedure(s) (LRB): ANTERIOR CERVICAL DECOMPRESSION/DISCECTOMY FUSION C4 - C6   2 LEVELS (N/A) Patient reports pain as mild and moderate.   Patient seen in rounds with Dr. Wynelle Link. Patient is well, but has had some minor complaints of pain on swallowing but getting slowly better We will resume therapy today.  Plan is to go Home after hospital stay.  Objective: Vital signs in last 24 hours: Temp:  [97.3 F (36.3 C)-98.2 F (36.8 C)] 97.8 F (36.6 C) (09/10 0554) Pulse Rate:  [72-80] 80 (09/10 0554) Resp:  [10-20] 10 (09/10 0554) BP: (110-135)/(62-95) 126/95 mmHg (09/10 0554) SpO2:  [90 %-95 %] 92 % (09/10 0554)   Recent Labs  10/27/14 0955  HGB 14.3    Recent Labs  10/27/14 0955  WBC 12.3*  RBC 4.65  HCT 41.2  PLT 217    Recent Labs  10/27/14 0955  NA 136  K 4.0  CL 102  CO2 25  BUN 20  CREATININE 0.73  GLUCOSE 101*  CALCIUM 9.0   No results for input(s): LABPT, INR in the last 72 hours.  EXAM General - Patient is Alert and Appropriate Extremity - Neurovascular intact Sensation intact distally Dressing - dressing C/D/I Motor Function - intact, moving hands and fingers well on exam.   Past Medical History  Diagnosis Date  . Hypertension   . COPD (chronic obstructive pulmonary disease)   . Heart murmur     Long time ago-  not now  . Asthma   . Shortness of breath dyspnea     with exertion  . Anxiety     Panic attacks- in past.  . GERD (gastroesophageal reflux disease)   . Sleep apnea     wears CPAP    Assessment/Plan: 3 Days Post-Op Procedure(s) (LRB): ANTERIOR CERVICAL DECOMPRESSION/DISCECTOMY FUSION C4 - C6   2 LEVELS (N/A) Active Problems:   Myelopathy  Estimated body mass index is 40.43 kg/(m^2) as calculated from the following:   Height as of 10/06/14: 5' (1.524 m).   Weight as of this encounter: 93.895 kg (207 lb). Up with staff walking. NEEDS to have full collar on while up.  Increase to full  liquids. Possible home tomorrow.  Arlee Muslim, PA-C Orthopaedic Surgery 10/30/2014, 9:08 AM

## 2014-10-30 NOTE — Progress Notes (Signed)
Physical Therapy Treatment Patient Details Name: [name] MRN: [id number] DOB: 05-05-1950 Today's Date: 10/30/2014    History of Present Illness 64 y.o. female s/p ANTERIOR CERVICAL DECOMPRESSION/DISCECTOMY FUSION C4 - C6 2 LEVELS     PT Comments    Continues to make strong progress towards functional goals. Ambulating without an assistive device today, tolerating increased distances and some higher level balance activities. Feels she is ready to go home. Adequate for d/c from a mobility standpoint when medically ready.  Follow Up Recommendations  Supervision for mobility/OOB (Outpatient PT when cleared by MD)     Equipment Recommendations  None recommended by PT    Recommendations for Other Services       Precautions / Restrictions Precautions Precautions: Cervical Precaution Comments: Reviewed Required Braces or Orthoses: Cervical Brace Cervical Brace: Hard collar Restrictions Weight Bearing Restrictions: No    Mobility  Bed Mobility Overal bed mobility: Needs Assistance Bed Mobility: Sidelying to Sit;Sit to Sidelying;Rolling Rolling: Supervision Sidelying to sit: Supervision     Sit to sidelying: Supervision General bed mobility comments: Supervision for safety. Still requires cues for log roll.  Transfers Overall transfer level: Needs assistance Equipment used: None Transfers: Sit to/from Stand Sit to Stand: Supervision         General transfer comment: Good power up to stand from lowest bed setting. Wide base of support but no loss of balance. Declines to use RW.  Ambulation/Gait Ambulation/Gait assistance: Supervision Ambulation Distance (Feet): 325 Feet Assistive device: None Gait Pattern/deviations: Step-through pattern;Drifts right/left;Wide base of support Gait velocity: decreased Gait velocity interpretation: Below normal speed for age/gender General Gait Details: Improved gait speed, did not require assistive device. Mild sway at times  but able to self correct. Tolerated some higher level balance activities such as side stepping, marching, and backwards stepping, all performed without loss of balance. Cues for awareness of safety and discussed safe mobility techniques while not using an assistive device.   Stairs            Wheelchair Mobility    Modified Rankin (Stroke Patients Only)       Balance                                    Cognition Arousal/Alertness: Awake/alert Behavior During Therapy: WFL for tasks assessed/performed Overall Cognitive Status: Within Functional Limits for tasks assessed                      Exercises General Exercises - Lower Extremity Gluteal Sets: Strengthening;Both;10 reps;Seated Long Arc Quad: Strengthening;Both;10 reps;Seated Hip Flexion/Marching: Strengthening;Right;10 reps;Seated    General Comments        Pertinent Vitals/Pain Pain Assessment: 0-10 Pain Score: 5  Pain Location: "Throat" Pain Descriptors / Indicators: Burning;Constant Pain Intervention(s): Monitored during session    Home Living                      Prior Function            PT Goals (current goals can now be found in the care plan section) Acute Rehab PT Goals Patient Stated Goal: Be able to walk for exercises in order to lose weight and play with great grandkids PT Goal Formulation: With patient Time For Goal Achievement: 11/11/14 Potential to Achieve Goals: Good Progress towards PT goals: Progressing toward goals    Frequency  Min 5X/week    PT Plan  Current plan remains appropriate    Co-evaluation             End of Session Equipment Utilized During Treatment: Cervical collar Activity Tolerance: Patient tolerated treatment well Patient left: with call bell/phone within reach;in bed     Time: 4315-4008 PT Time Calculation (min) (ACUTE ONLY): 13 min  Charges:  $Gait Training: 8-22 mins                    G Codes:      Ellouise Newer Oct 31, 2014, 3:12 PM [place]ille Bal [place], [place]

## 2014-10-31 MED ORDER — ONDANSETRON HCL 4 MG PO TABS: 4.0000 mg | ORAL_TABLET | Freq: Three times a day (TID) | ORAL | Status: DC | PRN

## 2014-10-31 MED ORDER — POLYETHYLENE GLYCOL 3350 17 GM/SCOOP PO POWD: 17.0000 g | Freq: Every day | ORAL | Status: DC

## 2014-10-31 MED ORDER — DOCUSATE SODIUM 100 MG PO CAPS: 100.0000 mg | ORAL_CAPSULE | Freq: Three times a day (TID) | ORAL | Status: DC | PRN

## 2014-10-31 MED ORDER — METHOCARBAMOL 500 MG PO TABS: 500.0000 mg | ORAL_TABLET | Freq: Three times a day (TID) | ORAL | Status: DC | PRN

## 2014-10-31 MED ORDER — OXYCODONE-ACETAMINOPHEN 10-325 MG PO TABS: 1.0000 | ORAL_TABLET | ORAL | Status: DC | PRN

## 2014-10-31 NOTE — Discharge Summary (Signed)
Patient ID: [name] MRN: [id number] DOB/AGE: [birth year] 64 y.o.  Admit date: 10/27/2014 Discharge date: 10/31/2014  Admission Diagnoses:  Active Problems:   Myelopathy   Discharge Diagnoses:  Active Problems:   Myelopathy  status post Procedure(s): ANTERIOR CERVICAL DECOMPRESSION/DISCECTOMY FUSION C4 - C6   2 LEVELS  Past Medical History  Diagnosis Date  . Hypertension   . COPD (chronic obstructive pulmonary disease)   . Heart murmur     Long time ago-  not now  . Asthma   . Shortness of breath dyspnea     with exertion  . Anxiety     Panic attacks- in past.  . GERD (gastroesophageal reflux disease)   . Sleep apnea     wears CPAP    Surgeries: Procedure(s): ANTERIOR CERVICAL DECOMPRESSION/DISCECTOMY FUSION C4 - C6   2 LEVELS on 10/27/2014   Consultants:  Cardiology  Discharged Condition: Improved  Hospital Course: Ninetta Adelstein is an 64 y.o. female who was admitted 10/27/2014 for operative treatment of cervical spondylotic myelopathy. Patient failed conservative treatments (please see the history and physical for the specifics) and had severe unremitting pain that affects sleep, daily activities and work/hobbies. After pre-op clearance, the patient was taken to the operating room on 10/27/2014 and underwent  Procedure(s): ANTERIOR CERVICAL DECOMPRESSION/DISCECTOMY FUSION C4 - C6   2 LEVELS.    Patient was given perioperative antibiotics: Anti-infectives    Start     Dose/Rate Route Frequency Ordered Stop   10/27/14 2000  ceFAZolin (ANCEF) IVPB 1 g/50 mL premix     1 g 100 mL/hr over 30 Minutes Intravenous Every 8 hours 10/27/14 1836 10/28/14 0527   10/27/14 1115  ceFAZolin (ANCEF) IVPB 2 g/50 mL premix     2 g 100 mL/hr over 30 Minutes Intravenous 30 min pre-op 10/27/14 0925 10/27/14 1158       Patient was given sequential compression devices and early ambulation to prevent DVT.   Patient benefited maximally from hospital stay and there were no  complications. At the time of discharge, the patient was urinating/moving their bowels without difficulty, tolerating a regular diet, pain is controlled with oral pain medications and they have been cleared by PT/OT.   Recent vital signs: Patient Vitals for the past 24 hrs:  BP Temp Temp src Pulse Resp SpO2  10/31/14 0914 (!) 120/55 mmHg 98.6 F (37 C) Oral 80 20 92 %  10/31/14 0618 122/60 mmHg 98.4 F (36.9 C) Oral 67 18 96 %  10/31/14 0152 (!) 120/51 mmHg 98.7 F (37.1 C) Oral 61 18 94 %  10/30/14 2228 (!) 143/58 mmHg 99 F (37.2 C) Oral 77 18 91 %  10/30/14 1724 129/62 mmHg 98 F (36.7 C) Oral 84 18 94 %  10/30/14 1340 (!) 128/56 mmHg 98 F (36.7 C) Oral 81 20 98 %     Recent laboratory studies: No results for input(s): WBC, HGB, HCT, PLT, NA, K, CL, CO2, BUN, CREATININE, GLUCOSE, INR, CALCIUM in the last 72 hours.  Invalid input(s): PT, 2   Discharge Medications:     Medication List    STOP taking these medications        butalbital-acetaminophen-caffeine 50-325-40 MG per tablet  Commonly known as:  FIORICET     diazepam 5 MG tablet  Commonly known as:  VALIUM     diclofenac 75 MG EC tablet  Commonly known as:  VOLTAREN     oxyCODONE-acetaminophen 5-325 MG per tablet  Commonly known as:  PERCOCET/ROXICET  Replaced by:  oxyCODONE-acetaminophen 10-325 MG per tablet      TAKE these medications        albuterol 108 (90 BASE) MCG/ACT inhaler  Commonly known as:  PROVENTIL HFA;VENTOLIN HFA  Inhale 2 puffs into the lungs every 6 (six) hours as needed for wheezing or shortness of breath.     atorvastatin 80 MG tablet  Commonly known as:  LIPITOR  Take 80 mg by mouth daily.     budesonide-formoterol 160-4.5 MCG/ACT inhaler  Commonly known as:  SYMBICORT  Inhale 2 puffs into the lungs 2 (two) times daily.     docusate sodium 100 MG capsule  Commonly known as:  COLACE  Take 1 capsule (100 mg total) by mouth 3 (three) times daily as needed for mild constipation.       DULoxetine 30 MG capsule  Commonly known as:  CYMBALTA  Take 30 mg by mouth 2 (two) times daily.     famotidine 20 MG tablet  Commonly known as:  PEPCID  Take 20 mg by mouth 2 (two) times daily.     fluticasone 50 MCG/ACT nasal spray  Commonly known as:  FLONASE  Place 2 sprays into both nostrils daily.     gabapentin 300 MG capsule  Commonly known as:  NEURONTIN  Take 900 mg by mouth 2 (two) times daily.     hydrochlorothiazide 25 MG tablet  Commonly known as:  HYDRODIURIL  Take 25 mg by mouth every other day.     levothyroxine 125 MCG tablet  Commonly known as:  SYNTHROID, LEVOTHROID  Take 125 mcg by mouth daily before breakfast.     lisinopril 10 MG tablet  Commonly known as:  PRINIVIL,ZESTRIL  Take 10 mg by mouth 2 (two) times daily.     loratadine 10 MG tablet  Commonly known as:  CLARITIN  Take 10 mg by mouth daily.     methocarbamol 500 MG tablet  Commonly known as:  ROBAXIN  Take 1 tablet (500 mg total) by mouth 3 (three) times daily as needed for muscle spasms.     metoprolol succinate 50 MG 24 hr tablet  Commonly known as:  TOPROL-XL  Take 50 mg by mouth 2 (two) times daily. Take with or immediately following a meal.     nitroGLYCERIN 0.4 MG SL tablet  Commonly known as:  NITROSTAT  Place 0.4 mg under the tongue every 5 (five) minutes as needed for chest pain.     ondansetron 4 MG tablet  Commonly known as:  ZOFRAN  Take 1 tablet (4 mg total) by mouth every 8 (eight) hours as needed for nausea or vomiting.     oxyCODONE-acetaminophen 10-325 MG per tablet  Commonly known as:  PERCOCET  Take 1 tablet by mouth every 4 (four) hours as needed for pain (hold for sedation of confusion.  Take for breakthrough pain).     polyethylene glycol powder powder  Commonly known as:  GLYCOLAX  Take 17 g by mouth daily.        Diagnostic Studies: Dg Cervical Spine 2 Or 3 Views  10/27/2014   CLINICAL DATA:  64 year old female with cervical fusion  EXAM: CERVICAL  SPINE - 2-3 VIEW  COMPARISON:  Intraoperative radiograph dated 10/27/2014  FINDINGS: Evaluation is limited due to artifact caused by soft tissues of the neck. The C7 vertebra is not visualized on the lateral image. C4-C6 anterior fixation plate and screws and disc spacers noted. There is normal cervical lordosis. No acute fracture.  There is prominence of the anterior spinal soft tissues, likely related to postoperative edema. Underlying hematoma or fluid collection is not excluded. Follow-up recommended.  IMPRESSION: Postsurgical changes of C4-C6 fixation.   Electronically Signed   By: Anner Crete M.D.   On: 10/27/2014 23:48   Dg Cervical Spine 2-3 Views  10/27/2014   CLINICAL DATA:  Surgical anterior cervical disc fusion.  EXAM: CERVICAL SPINE - 2-3 VIEW  COMPARISON:  Same day.  FLUOROSCOPY TIME:  36 seconds.  FINDINGS: Three intraoperative fluoroscopic images were obtained of the cervical spine. These demonstrate the patient be status post surgical anterior fusion of C4 through C6 with good alignment of vertebral bodies. Interbody fusion is noted.  IMPRESSION: Status post surgical anterior fusion of C4 through C6.   Electronically Signed   By: Marijo Conception, M.D.   On: 10/27/2014 15:44   Ct Soft Tissue Neck Wo Contrast  10/29/2014   CLINICAL DATA:  Neck swelling. Anterior cervical decompression/ discectomy with fusion at C4 through C6 2 days ago.  EXAM: CT NECK WITHOUT CONTRAST  TECHNIQUE: Multidetector CT imaging of the neck was performed following the standard protocol without intravenous contrast.  COMPARISON:  Radiographs dated 10/27/2014  FINDINGS: There is a poorly defined fluid collection just to the left of the thyroid cartilage measuring approximately 4.5 x 4.0 x 2.4 cm with multiple gas bubbles in the soft tissues extending above the fluid collection. This could represent a postoperative seroma or hematoma. This less likely represent a postoperative abscess.  There is only a small amount of  prevertebral soft tissue edema to the expected degree.  Hardware of the anterior cervical fusion appears in excellent position.  IMPRESSION: Focal small postoperative seroma or hematoma just to the left of the thyroid cartilage. This is less likely to represent an abscess. Does the patient have any clinical symptoms of infection?   Electronically Signed   By: Lorriane Shire M.D.   On: 10/29/2014 10:23   Dg C-arm 1-60 Min  10/27/2014   CLINICAL DATA:  Anterior cervical disc fusion of C4-5 and C5-6.  EXAM: DG C-ARM 61-120 MIN  COMPARISON:  Same day.  FINDINGS: Three intraoperative fluoroscopic images of the cervical spine demonstrate the patient be status post surgical anterior fusion of C4, C5 and C6 with interbody fusion. Good alignment of the vertebral bodies is noted.  IMPRESSION: Status post surgical anterior fusion from C4-C6.   Electronically Signed   By: Marijo Conception, M.D.   On: 10/27/2014 15:38          Follow-up Information    Follow up with Dahlia Bailiff, MD. Schedule an appointment as soon as possible for a visit in 2 weeks.   Specialty:  Orthopedic Surgery   Why:  If symptoms worsen, For suture removal, For wound re-check   Contact information:   [address] [place] [address] [phone]       Discharge Plan:  discharge to   Hospital course: Patient's course complicated by post-operative chest pain.  Cardiology consult ordered - ultimately patient was cleared.  No active cardiac issue was found.  COPD remained well controlled with nasal CPAP.   Patient did develop post-operative hematoma which necessitated CT scan evaluation.   Imaging did not show tracheal deviation or a significant hematoma.  Clinically the hematoma was noted to be decreasing in size and tenderness.  Recommend ice and observation.  Oncew patient stabilized and pain was controlled she was d/c'ed to home.  Extended  hospital stay secondary to complaints of chest pain requiring cardiac  evaluation, post-operative hematoma requiring additional observation and imaging to ensure no airway compromise would occur.  Pain also issue which improved during course of stay.      Signed: Melina Schools D for Dr. Melina Schools [facility] [phone] 10/31/2014, 9:28 AM

## 2014-10-31 NOTE — Progress Notes (Signed)
Pt discharging at this time with her friend alert, verbal taking all personal belongings. IV discontinued, dry dressing applied. Discharge instructions and prescriptions provided with verbal understanding. Pt is to follow up with Md in 2 weeks and is aware. Surgical site dry and steri-strips intact. She denies pain at this time. No noted distress.

## 2014-10-31 NOTE — Progress Notes (Addendum)
    Subjective: Procedure(s) (LRB): ANTERIOR CERVICAL DECOMPRESSION/DISCECTOMY FUSION C4 - C6   2 LEVELS (N/A) 4 Days Post-Op  Patient reports pain as 3 on 0-10 scale.  Reports decreased arm pain reports incisional neck pain   Positive void Negative bowel movement Positive flatus Negative chest pain or shortness of breath  Objective: Vital signs in last 24 hours: Temp:  [98 F (36.7 C)-99 F (37.2 C)] 98.6 F (37 C) (09/11 0914) Pulse Rate:  [61-126] 80 (09/11 0914) Resp:  [18-20] 20 (09/11 0914) BP: (120-143)/(51-66) 120/55 mmHg (09/11 0914) SpO2:  [90 %-98 %] 92 % (09/11 0914)  Intake/Output from previous day:    Labs: No results for input(s): WBC, RBC, HCT, PLT in the last 72 hours. No results for input(s): NA, K, CL, CO2, BUN, CREATININE, GLUCOSE, CALCIUM in the last 72 hours. No results for input(s): LABPT, INR in the last 72 hours.  Physical Exam: Neurologically intact ABD soft Intact pulses distally Incision: dressing C/D/I Compartment soft  Assessment/Plan: Patient stable  xrays staisfactory Mobilization with physical therapy Encourage incentive spirometry Continue care  Swelling improving Pain controlled with oxycodone and robaxin Plan on d/c to home No further episodes of chest pain No issues with breathing/COPD - CPAP functioning well  Melina Schools, MD [place] [phone]

## 2014-10-31 NOTE — Progress Notes (Signed)
Patient self-administers her CPAP therapy.  She is familiar with equipment and procedure.  Hospital required machine with her nasal prongs from home.

## 2014-10-31 NOTE — Discharge Instructions (Signed)

## 2014-11-30 ENCOUNTER — Other Ambulatory Visit: Payer: Self-pay | Admitting: Orthopedic Surgery

## 2014-11-30 DIAGNOSIS — Z4789 Encounter for other orthopedic aftercare: Secondary | ICD-10-CM

## 2014-12-06 ENCOUNTER — Ambulatory Visit
Admission: RE | Admit: 2014-12-06 | Discharge: 2014-12-06 | Disposition: A | Discharge status: Home / Self Care | Payer: Medicare Other | Source: Ambulatory Visit | Attending: Orthopedic Surgery | Admitting: Orthopedic Surgery

## 2014-12-06 DIAGNOSIS — Z4789 Encounter for other orthopedic aftercare: Secondary | ICD-10-CM

## 2014-12-06 MED ORDER — IOPAMIDOL (ISOVUE-300) INJECTION 61%
75.0000 mL | Freq: Once | INTRAVENOUS | Status: AC | PRN
  Administered 2014-12-06: 75 mL via INTRAVENOUS

## 2015-02-04 DIAGNOSIS — K5730 Diverticulosis of large intestine without perforation or abscess without bleeding: Secondary | ICD-10-CM | POA: Insufficient documentation

## 2015-02-09 ENCOUNTER — Encounter: Payer: Self-pay | Admitting: Physician Assistant

## 2015-02-20 HISTORY — PX: WRIST SURGERY: SHX841

## 2015-02-22 DIAGNOSIS — Z9181 History of falling: Secondary | ICD-10-CM | POA: Insufficient documentation

## 2015-02-28 ENCOUNTER — Ambulatory Visit: Payer: Medicare Other | Admitting: Physician Assistant

## 2015-03-01 ENCOUNTER — Ambulatory Visit: Payer: Medicare Other | Admitting: Gastroenterology

## 2015-03-15 ENCOUNTER — Encounter: Payer: Self-pay | Admitting: Physician Assistant

## 2015-03-15 ENCOUNTER — Ambulatory Visit (INDEPENDENT_AMBULATORY_CARE_PROVIDER_SITE_OTHER): Payer: Medicare Other | Admitting: Physician Assistant

## 2015-03-15 VITALS — BP 140/90 | HR 49 | Ht 60.0 in | Wt 205.0 lb

## 2015-03-15 DIAGNOSIS — K219 Gastro-esophageal reflux disease without esophagitis: Secondary | ICD-10-CM

## 2015-03-15 DIAGNOSIS — K5909 Other constipation: Secondary | ICD-10-CM | POA: Diagnosis not present

## 2015-03-15 DIAGNOSIS — R1319 Other dysphagia: Secondary | ICD-10-CM | POA: Diagnosis not present

## 2015-03-15 DIAGNOSIS — R194 Change in bowel habit: Secondary | ICD-10-CM

## 2015-03-15 MED ORDER — PANTOPRAZOLE SODIUM 40 MG PO TBEC: 40.0000 mg | DELAYED_RELEASE_TABLET | Freq: Every day | ORAL | Status: DC

## 2015-03-15 MED ORDER — NA SULFATE-K SULFATE-MG SULF 17.5-3.13-1.6 GM/177ML PO SOLN: 1.0000 | Freq: Once | ORAL | Status: AC

## 2015-03-15 NOTE — Patient Instructions (Signed)
You have been scheduled for an endoscopy and colonoscopy. Please follow the written instructions given to you at your visit today. Please pick up your prep supplies at the pharmacy within the next 1-3 days. [place]. We also sent a prescription for Protonix ( Pantoprazole sodium 40 mg.  If you use inhalers (even only as needed), please bring them with you on the day of your procedure. Your physician has requested that you go to www.startemmi.com and enter the access code given to you at your visit today. This web site gives a general overview about your procedure. However, you should still follow specific instructions given to you by our office regarding your preparation for the procedure.  Take Miralax 17 grams in 8 oz of water daily.

## 2015-03-15 NOTE — Progress Notes (Addendum)
Patient ID: [name], female   DOB: 1950/03/13, 65 y.o.   MRN: [id number]   Subjective:    Patient ID: [name], female    DOB: December 08, 1950, 65 y.o.   MRN: [id number]  HPI  [name]. Patient states that she has significant memory loss issues but knows that she had a prior colonoscopy done in [place] in 2008. She does not know the results of this procedure.  says currently she's been having one month of ongoing problems with constipation which is been fairly severe. She has been taking fiber, MiraLAX and senna and says that when she takes the senna as directed she will wind up with diarrhea. Been having intermittent lower abdominal crampy " grabbing" pain and postprandial urgency at times. Appetite has been fair her weight has been stable.  She also complains of a "full" feeling in her chest and knows that she has a hiatal hernia. She says sometimes she gets a "pushing up" feeling. She's had ongoing problems with heartburn and indigestion. She had been given Pepcid most recently which was not helping so she stopped it. He has had intermittent solid food dysphagia over the past several months and says she's drained herself to take very very small bites and chew carefully. Did have a cervical fusion done in September 2016 as well.  Family history negative for colon cancer   No blood thinners other than baby aspirin  No prior abdominal surgeries.  Stool cultures including C. Difficile, after a bout of diarrhea  Negative 02/22/2015, CBC within normal limits , pancreatic fecal elastase was normal stool for lactoferrin  Negative, TTG normal  Review of Systems Pertinent positive and negative review of systems were noted in the above HPI section.  All other review of systems was otherwise negative.  Outpatient Encounter  Prescriptions as of 03/15/2015  Medication Sig  . albuterol (PROVENTIL HFA;VENTOLIN HFA) 108 (90 BASE) MCG/ACT inhaler Inhale 2 puffs into the lungs every 6 (six) hours as needed for wheezing or shortness of breath.  Marland Kitchen atorvastatin (LIPITOR) 80 MG tablet Take 80 mg by mouth daily.  . budesonide-formoterol (SYMBICORT) 160-4.5 MCG/ACT inhaler Inhale 2 puffs into the lungs 2 (two) times daily.  . DULoxetine (CYMBALTA) 30 MG capsule Take 30 mg by mouth 2 (two) times daily.  Marland Kitchen gabapentin (NEURONTIN) 300 MG capsule Take 900 mg by mouth 2 (two) times daily.   . hydrochlorothiazide (HYDRODIURIL) 25 MG tablet Take 25 mg by mouth every other day.   . levothyroxine (SYNTHROID, LEVOTHROID) 125 MCG tablet Take 125 mcg by mouth daily before breakfast.  . lisinopril (PRINIVIL,ZESTRIL) 10 MG tablet Take 10 mg by mouth 2 (two) times daily.   . metoprolol succinate (TOPROL-XL) 50 MG 24 hr tablet Take 50 mg by mouth 2 (two) times daily. Take with or immediately following a meal.  . nitroGLYCERIN (NITROSTAT) 0.4 MG SL tablet Place 0.4 mg under the tongue every 5 (five) minutes as needed for chest pain.  Marland Kitchen ondansetron (ZOFRAN) 4 MG tablet Take 1 tablet (4 mg total) by mouth every 8 (eight) hours as needed for nausea or vomiting.  Marland Kitchen oxyCODONE (OXY IR/ROXICODONE) 5 MG immediate release tablet Take 5 mg by mouth every 4 (four) hours as needed for severe pain.  . Na Sulfate-K Sulfate-Mg Sulf SOLN Take 1 kit by mouth once.  Marland Kitchen  pantoprazole (PROTONIX) 40 MG tablet Take 1 tablet (40 mg total) by mouth daily.  . [DISCONTINUED] docusate sodium (COLACE) 100 MG capsule Take 1 capsule (100 mg total) by mouth 3 (three) times daily as needed for mild constipation.  . [DISCONTINUED] famotidine (PEPCID) 20 MG tablet Take 20 mg by mouth 2 (two) times daily.  . [DISCONTINUED] fluticasone (FLONASE) 50 MCG/ACT nasal spray Place 2 sprays into both nostrils daily.  . [DISCONTINUED] loratadine (CLARITIN) 10 MG tablet Take 10 mg by mouth  daily.  . [DISCONTINUED] methocarbamol (ROBAXIN) 500 MG tablet Take 1 tablet (500 mg total) by mouth 3 (three) times daily as needed for muscle spasms.  . [DISCONTINUED] oxyCODONE-acetaminophen (PERCOCET) 10-325 MG per tablet Take 1 tablet by mouth every 4 (four) hours as needed for pain (hold for sedation of confusion.  Take for breakthrough pain).  . [DISCONTINUED] polyethylene glycol powder (GLYCOLAX) powder Take 17 g by mouth daily.   No facility-administered encounter medications on file as of 03/15/2015.   Allergies  Allergen Reactions  . Bee Venom Anaphylaxis  . Codeine Nausea Only    Pt reported nausea to nurse when asked about allergy on 10/27/14  . Hydrocodone-Acetaminophen Other (See Comments)    Halluninations  . Procaine Hcl   . Tramadol     Other reaction(s): NAUSEA  . Latex Rash   Patient Active Problem List   Diagnosis Date Noted  . Myelopathy ([place]) 10/27/2014  . HYPOTHYROIDISM 09/24/2006  . HYPERTENSION 09/24/2006  . BACK PAIN, CHRONIC, HX OF 09/24/2006  . MIGRAINES, HX OF 09/24/2006  . CARPAL TUNNEL RELEASE, RIGHT, HX OF 09/24/2006  . OSTEOARTHRITIS, LUMBOSACRAL SPINE 08/02/2005   Social History   Social History  . Marital Status: Single    Spouse Name: N/A  . Number of Children: 2  . Years of Education: N/A   Occupational History  . Not on file.   Social History Main Topics  . Smoking status: Never Smoker   . Smokeless tobacco: Never Used  . Alcohol Use: 0.0 oz/week    0 Standard drinks or equivalent per week  . Drug Use: No  . Sexual Activity: Not on file   Other Topics Concern  . Not on file   Social History Narrative    Ms. Joynt's family history includes Colon polyps in her brother; Diabetes in her maternal aunt, mother, and sister; Esophageal cancer in her maternal grandfather; Heart disease in her father and maternal uncle; Irritable bowel syndrome in her brother.      Objective:    Filed Vitals:   03/15/15 1402  BP: 140/90    Pulse: 49    Physical Exam   Well-developed older white female in no acute distress, pleasant blood pressure 140/90 pulse 49 height 5 foot 1 weight 205 , BMI 40. HEENT; nontraumatic normocephalic EOMI PERRLA sclera anicteric , Cardiovascular; regular rate and rhythm with S1-S2 no murmur or gallop, Pulmonary ;clear bilaterally , Abdomen ;soft bowel sounds are present, obese is no focal tenderness no guarding or rebound no palpable mass or hepatosplenomegaly, Rectal; exam not done, Extremities; no clubbing cyanosis or edema skin warm and dry, Neuropsych; mood and affect appropriate     Assessment & Plan:   #1 65 yo female with chronic GERD and solid food dysphagia- r/o peptic stricture  patient does have cervical hardware which may be contributing though says her dysphagia preceded this.  #2  New onset constipation and alternating bowel Crawford with lower abdominal cramping. Etiology not clear recent negative infectious workup.  This may be functional constipation but cannot rule out occult colon lesion or inflammatory process  #3 morbid obesity  #4 hypertension  #5 hypothyroid  #6 COPD   Plan; start Protonix 40 mg by mouth every morning  Antireflux regimen  Take MiraLAX 17 g in 8 ounces of water every day , and senna as needed  Schedule for EGD with probable esophageal dilation and colonoscopy with Dr.Pyrtle. Procedures discussed with patient in detail including risks and benefits and she is agreeable to proceed.   Garlan Drewes Genia Harold PA-C 03/15/2015   Cc: [name], MD  Addendum: Reviewed and agree with initial management. Jerene Bears, MD

## 2015-03-31 ENCOUNTER — Encounter: Payer: Medicare Other | Admitting: Internal Medicine

## 2015-04-25 ENCOUNTER — Encounter: Payer: Self-pay | Admitting: Internal Medicine

## 2015-04-25 ENCOUNTER — Ambulatory Visit (AMBULATORY_SURGERY_CENTER): Payer: Medicare Other | Admitting: Internal Medicine

## 2015-04-25 VITALS — BP 148/67 | HR 53 | Temp 98.0°F | Resp 13 | Ht 60.0 in | Wt 205.0 lb

## 2015-04-25 DIAGNOSIS — R1319 Other dysphagia: Secondary | ICD-10-CM | POA: Diagnosis not present

## 2015-04-25 DIAGNOSIS — D122 Benign neoplasm of ascending colon: Secondary | ICD-10-CM | POA: Diagnosis not present

## 2015-04-25 DIAGNOSIS — K621 Rectal polyp: Secondary | ICD-10-CM

## 2015-04-25 DIAGNOSIS — K219 Gastro-esophageal reflux disease without esophagitis: Secondary | ICD-10-CM

## 2015-04-25 DIAGNOSIS — D125 Benign neoplasm of sigmoid colon: Secondary | ICD-10-CM

## 2015-04-25 DIAGNOSIS — D128 Benign neoplasm of rectum: Secondary | ICD-10-CM

## 2015-04-25 DIAGNOSIS — R198 Other specified symptoms and signs involving the digestive system and abdomen: Secondary | ICD-10-CM

## 2015-04-25 DIAGNOSIS — K5900 Constipation, unspecified: Secondary | ICD-10-CM

## 2015-04-25 DIAGNOSIS — D129 Benign neoplasm of anus and anal canal: Secondary | ICD-10-CM

## 2015-04-25 DIAGNOSIS — R194 Change in bowel habit: Secondary | ICD-10-CM

## 2015-04-25 MED ORDER — SODIUM CHLORIDE 0.9 % IV SOLN: 500.0000 mL | INTRAVENOUS | Status: DC

## 2015-04-25 MED ORDER — LINACLOTIDE 145 MCG PO CAPS: ORAL_CAPSULE | ORAL | Status: DC

## 2015-04-25 NOTE — Op Note (Signed)
[facility]  [address] [place], [id number]   ENDOSCOPY PROCEDURE REPORT  PATIENT: [name], [name]  MR#: [id number] BIRTHDATE: [birth year] , 76  yrs. old GENDER: female ENDOSCOPIST: Jerene Bears, MD PROCEDURE DATE:  04/25/2015 PROCEDURE:  EGD, diagnostic ASA CLASS:     Class III INDICATIONS:  history of GERD and dysphagia. MEDICATIONS: Monitored anesthesia care and Propofol 150 mg IV TOPICAL ANESTHETIC: none  DESCRIPTION OF PROCEDURE: After the risks benefits and alternatives of the procedure were thoroughly explained, informed consent was obtained.  The LB [id number] endoscope was introduced through the mouth and advanced to the second portion of the duodenum , Without limitations.  The instrument was slowly withdrawn as the mucosa was fully examined.   ESOPHAGUS: The mucosa of the esophagus appeared normal.   No evidence of stricture.  STOMACH: A small, 1-2 cm, hiatal hernia was noted.   The mucosa of the stomach appeared normal.  DUODENUM: The duodenal mucosa showed no abnormalities in the bulb and 2nd part of the duodenum.  Retroflexed views revealed no abnormalities.     The scope was then withdrawn from the patient and the procedure completed.  COMPLICATIONS: There were no immediate complications.  ENDOSCOPIC IMPRESSION: 1.   The mucosa of the esophagus appeared normal 2.   2 cm hiatal hernia 3.   The mucosa of the stomach appeared normal 4.   The duodenal mucosa showed no abnormalities in the bulb and 2nd part of the duodenum  RECOMMENDATIONS: 1.  Anti-reflux regimen to be followed 2.  Continue pantoprazole given improvement in symptoms 2.  Proceed with a colonoscopy.  eSigned:  Jerene Bears, MD 04/25/2015 3:09 PM    CC: the patient, PCP

## 2015-04-25 NOTE — Patient Instructions (Addendum)
YOU HAD AN ENDOSCOPIC PROCEDURE TODAY AT [facility]:   Refer to the procedure report that was given to you for any specific questions about what was found during the examination.  If the procedure report does not answer your questions, please call your gastroenterologist to clarify.  If you requested that your care partner not be given the details of your procedure findings, then the procedure report has been included in a sealed envelope for you to review at your convenience later.  YOU SHOULD EXPECT: Some feelings of bloating in the abdomen. Passage of more gas than usual.  Walking can help get rid of the air that was put into your GI tract during the procedure and reduce the bloating. If you had a lower endoscopy (such as a colonoscopy or flexible sigmoidoscopy) you may notice spotting of blood in your stool or on the toilet paper. If you underwent a bowel prep for your procedure, you may not have a normal bowel movement for a few days.  Please Note:  You might notice some irritation and congestion in your nose or some drainage.  This is from the oxygen used during your procedure.  There is no need for concern and it should clear up in a day or so.  SYMPTOMS TO REPORT IMMEDIATELY:   Following lower endoscopy (colonoscopy or flexible sigmoidoscopy):  Excessive amounts of blood in the stool  Significant tenderness or worsening of abdominal pains  Swelling of the abdomen that is new, acute  Fever of 100F or higher   Following upper endoscopy (EGD)  Vomiting of blood or coffee ground material  New chest pain or pain under the shoulder blades  Painful or persistently difficult swallowing  New shortness of breath  Fever of 100F or higher  Black, tarry-looking stools  For urgent or emergent issues, a gastroenterologist can be reached at any hour by calling [phone].   DIET: Your first meal following the procedure should be a small meal and then it is ok to progress to  your normal diet. Heavy or fried foods are harder to digest and may make you feel nauseous or bloated.  Likewise, meals heavy in dairy and vegetables can increase bloating.  Drink plenty of fluids but you should avoid alcoholic beverages for 24 hours.  ACTIVITY:  You should plan to take it easy for the rest of today and you should NOT DRIVE or use heavy machinery until tomorrow (because of the sedation medicines used during the test).    FOLLOW UP: Our staff will call the number listed on your records the next business day following your procedure to check on you and address any questions or concerns that you may have regarding the information given to you following your procedure. If we do not reach you, we will leave a message.  However, if you are feeling well and you are not experiencing any problems, there is no need to return our call.  We will assume that you have returned to your regular daily activities without incident.  If any biopsies were taken you will be contacted by phone or by letter within the next 1-3 weeks.  Please call us at [phone] if you have not heard about the biopsies in 3 weeks.   SIGNATURES/CONFIDENTIALITY: You and/or your care partner have signed paperwork which will be entered into your electronic medical record.  These signatures attest to the fact that that the information above on your After Visit Summary has been reviewed and  is understood.  Full responsibility of the confidentiality of this discharge information lies with you and/or your care-partner.  Await pathology  Continue your normal medications, including Pantoprazole  Please read over handout about polyps, diverticulosis, high fiber diets, and anti-reflux regimen  Please call office in the next few days to set up a follow up office appointment with Dr [name]  Your Linzess prescription was sent to your pharmacy  Stop your Miralax and Senna

## 2015-04-25 NOTE — Progress Notes (Signed)
To pacu vss patent aw report to rn 

## 2015-04-25 NOTE — Progress Notes (Signed)
Called to room to assist during endoscopic procedure.  Patient ID and intended procedure confirmed with present staff. Received instructions for my participation in the procedure from the performing physician.  

## 2015-04-25 NOTE — Op Note (Signed)
[facility]  [address] [place], [id number]   COLONOSCOPY PROCEDURE REPORT  PATIENT: [name], [name]  MR#: [id number] BIRTHDATE: 07/18/1950 , 61  yrs. old GENDER: female ENDOSCOPIST: Jerene Bears, MD PROCEDURE DATE:  04/25/2015 PROCEDURE:   Colonoscopy, diagnostic First Screening Colonoscopy - Avg.  risk and is 50 yrs.  old or older - No.  Prior Negative Screening - Now for repeat screening. N/A  History of Adenoma - Now for follow-up colonoscopy & has been > or = to 3 yrs.  N/A  Polyps removed today? Yes ASA CLASS:   Class III INDICATIONS:constipation, change in bowel habits, and lower abdominal pain. MEDICATIONS: Monitored anesthesia care, Propofol 300 mg IV, and this was the total dose used for all procedures at this session  DESCRIPTION OF PROCEDURE:   After the risks benefits and alternatives of the procedure were thoroughly explained, informed consent was obtained.  The digital rectal exam revealed external hemorrhoids.   The LB [id number]  endoscope was introduced through the anus and advanced to the cecum, which was identified by both the appendix and ileocecal valve. No adverse events experienced.   The quality of the prep was good.  (Suprep was used) The instrument was then slowly withdrawn as the colon was fully examined. Estimated blood loss is zero unless otherwise noted in this procedure report.  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 A sessile polyp measuring 5 mm in size was found in the ascending colon.  A polypectomy was performed with a cold snare.  The resection was complete, the polyp tissue was completely retrieved and sent to histology.   Three sessile polyps ranging between 3-56mm in size were found in the sigmoid colon (2) and rectum (1). Polypectomies were performed with a cold snare.  The resection was complete, the polyp tissue was completely retrieved and sent to histology.   There was mild  diverticulosis noted in the sigmoid colon.  Hypertrophied anal papilla seen on retroflexion. The time to cecum = 1.4 Withdrawal time = 12.8   The scope was withdrawn and the procedure completed. COMPLICATIONS: There were no immediate complications.  ENDOSCOPIC IMPRESSION: 1.   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   Sessile polyp was found in the ascending colon; polypectomy was performed with a cold snare 3.   Three sessile polyps ranging between 3-82mm in size were found in the sigmoid colon and rectum; polypectomies were performed with a cold snare 4.   Mild diverticulosis was noted in the sigmoid colon  RECOMMENDATIONS: 1.  Await pathology results 2.  High fiber diet 3.  Timing of repeat colonoscopy will be determined by pathology findings. 4.  You will receive a letter within 1-2 weeks with the results of your biopsy as well as final recommendations.  Please call my office if you have not received a letter after 3 weeks. 5.  If MiraLAX and senna ineffective for constipation relief could consider trial of Linzess 6.  Office follow-up next available for continuity  eSigned:  Jerene Bears, MD 04/25/2015 3:14 PM   cc:  the patient, PCP, Dr. Melina Schools ([place])   PATIENT NAME:  [name], [name] MR#: [id number]

## 2015-04-26 ENCOUNTER — Telehealth: Payer: Self-pay | Admitting: *Deleted

## 2015-04-26 NOTE — Telephone Encounter (Signed)
  Follow up Call-  Call back number 04/25/2015  Post procedure Call Back phone  # [phone] cell  Permission to leave phone message Yes     Patient questions:  Do you have a fever, pain , or abdominal swelling? No. Pain Score  0 *  Have you tolerated food without any problems? Yes.    Have you been able to return to your normal activities? Yes.    Do you have any questions about your discharge instructions: Diet   No. Medications  No. Follow up visit  No.  Do you have questions or concerns about your Care? No.  Actions: * If pain score is 4 or above: No action needed, pain <4.

## 2015-04-29 ENCOUNTER — Encounter: Payer: Self-pay | Admitting: Internal Medicine

## 2015-05-13 ENCOUNTER — Encounter: Payer: Self-pay | Admitting: Emergency Medicine

## 2015-05-13 ENCOUNTER — Emergency Department: Payer: Medicare Other

## 2015-05-13 ENCOUNTER — Emergency Department
Admission: EM | Admit: 2015-05-13 | Discharge: 2015-05-13 | Disposition: A | Discharge status: Home / Self Care | Payer: Medicare Other | Attending: Emergency Medicine | Admitting: Emergency Medicine

## 2015-05-13 DIAGNOSIS — F329 Major depressive disorder, single episode, unspecified: Secondary | ICD-10-CM | POA: Insufficient documentation

## 2015-05-13 DIAGNOSIS — J069 Acute upper respiratory infection, unspecified: Secondary | ICD-10-CM | POA: Diagnosis not present

## 2015-05-13 DIAGNOSIS — Z7951 Long term (current) use of inhaled steroids: Secondary | ICD-10-CM | POA: Insufficient documentation

## 2015-05-13 DIAGNOSIS — J449 Chronic obstructive pulmonary disease, unspecified: Secondary | ICD-10-CM | POA: Insufficient documentation

## 2015-05-13 DIAGNOSIS — M1990 Unspecified osteoarthritis, unspecified site: Secondary | ICD-10-CM | POA: Diagnosis not present

## 2015-05-13 DIAGNOSIS — Z79899 Other long term (current) drug therapy: Secondary | ICD-10-CM | POA: Insufficient documentation

## 2015-05-13 DIAGNOSIS — E039 Hypothyroidism, unspecified: Secondary | ICD-10-CM | POA: Diagnosis not present

## 2015-05-13 DIAGNOSIS — J45909 Unspecified asthma, uncomplicated: Secondary | ICD-10-CM | POA: Insufficient documentation

## 2015-05-13 DIAGNOSIS — Z91040 Latex allergy status: Secondary | ICD-10-CM | POA: Insufficient documentation

## 2015-05-13 DIAGNOSIS — G43909 Migraine, unspecified, not intractable, without status migrainosus: Secondary | ICD-10-CM | POA: Diagnosis not present

## 2015-05-13 DIAGNOSIS — I10 Essential (primary) hypertension: Secondary | ICD-10-CM | POA: Insufficient documentation

## 2015-05-13 DIAGNOSIS — E785 Hyperlipidemia, unspecified: Secondary | ICD-10-CM | POA: Insufficient documentation

## 2015-05-13 DIAGNOSIS — T7840XA Allergy, unspecified, initial encounter: Secondary | ICD-10-CM | POA: Diagnosis not present

## 2015-05-13 DIAGNOSIS — R05 Cough: Secondary | ICD-10-CM | POA: Diagnosis present

## 2015-05-13 MED ORDER — CETIRIZINE HCL 10 MG PO TABS: 10.0000 mg | ORAL_TABLET | Freq: Every day | ORAL | Status: DC

## 2015-05-13 MED ORDER — AZITHROMYCIN 250 MG PO TABS: ORAL_TABLET | ORAL | Status: DC

## 2015-05-13 MED ORDER — BENZONATATE 100 MG PO CAPS: 100.0000 mg | ORAL_CAPSULE | Freq: Four times a day (QID) | ORAL | Status: DC | PRN

## 2015-05-13 MED ORDER — PREDNISONE 10 MG (21) PO TBPK: 10.0000 mg | ORAL_TABLET | Freq: Every day | ORAL | Status: DC

## 2015-05-13 NOTE — Discharge Instructions (Signed)
Allergies An allergy is an abnormal reaction to a substance by the body's defense system (immune system). Allergies can develop at any age. WHAT CAUSES ALLERGIES? An allergic reaction happens when the immune system mistakenly reacts to a normally harmless substance, called an allergen, as if it were harmful. The immune system releases antibodies to fight the substance. Antibodies eventually release a chemical called histamine into the bloodstream. The release of histamine is meant to protect the body from infection, but it also causes discomfort. An allergic reaction can be triggered by:  Eating an allergen.  Inhaling an allergen.  Touching an allergen. WHAT TYPES OF ALLERGIES ARE THERE? There are many types of allergies. Common types include:  Seasonal allergies. People with this type of allergy are usually allergic to substances that are only present during certain seasons, such as molds and pollens.  Food allergies.  Drug allergies.  Insect allergies.  Animal dander allergies. WHAT ARE SYMPTOMS OF ALLERGIES? Possible allergy symptoms include:  Swelling of the lips, face, tongue, mouth, or throat.  Sneezing, coughing, or wheezing.  Nasal congestion.  Tingling in the mouth.  Rash.  Itching.  Itchy, red, swollen areas of skin (hives).  Watery eyes.  Vomiting.  Diarrhea.  Dizziness.  Lightheadedness.  Fainting.  Trouble breathing or swallowing.  Chest tightness.  Rapid heartbeat. HOW ARE ALLERGIES DIAGNOSED? Allergies are diagnosed with a medical and family history and one or more of the following:  Skin tests.  Blood tests.  A food diary. A food diary is a record of all the foods and drinks you have in a day and of all the symptoms you experience.  The results of an elimination diet. An elimination diet involves eliminating foods from your diet and then adding them back in one by one to find out if a certain food causes an allergic reaction. HOW ARE  ALLERGIES TREATED? There is no cure for allergies, but allergic reactions can be treated with medicine. Severe reactions usually need to be treated at a hospital. HOW CAN REACTIONS BE PREVENTED? The best way to prevent an allergic reaction is by avoiding the substance you are allergic to. Allergy shots and medicines can also help prevent reactions in some cases. People with severe allergic reactions may be able to prevent a life-threatening reaction called anaphylaxis with a medicine given right after exposure to the allergen.   This information is not intended to replace advice given to you by your health care provider. Make sure you discuss any questions you have with your health care provider.   Document Released: 05/01/2002 Document Revised: 02/26/2014 Document Reviewed: 11/17/2013 Elsevier Interactive Patient Education 2016 [employer]  Cough, Adult Coughing is a reflex that clears your throat and your airways. Coughing helps to heal and protect your lungs. It is normal to cough occasionally, but a cough that happens with other symptoms or lasts a long time may be a sign of a condition that needs treatment. A cough may last only 2-3 weeks (acute), or it may last longer than 8 weeks (chronic). CAUSES Coughing is commonly caused by:  Breathing in substances that irritate your lungs.  A viral or bacterial respiratory infection.  Allergies.  Asthma.  Postnasal drip.  Smoking.  Acid backing up from the stomach into the esophagus (gastroesophageal reflux).  Certain medicines.  Chronic lung problems, including COPD (or rarely, lung cancer).  Other medical conditions such as heart failure. HOME CARE INSTRUCTIONS  Pay attention to any changes in your symptoms. Take these actions to  help with your discomfort:  Take medicines only as told by your health care provider.  If you were prescribed an antibiotic medicine, take it as told by your health care provider. Do not stop taking  the antibiotic even if you start to feel better.  Talk with your health care provider before you take a cough suppressant medicine.  Drink enough fluid to keep your urine clear or pale yellow.  If the air is dry, use a cold steam vaporizer or humidifier in your bedroom or your home to help loosen secretions.  Avoid anything that causes you to cough at work or at home.  If your cough is worse at night, try sleeping in a semi-upright position.  Avoid cigarette smoke. If you smoke, quit smoking. If you need help quitting, ask your health care provider.  Avoid caffeine.  Avoid alcohol.  Rest as needed. SEEK MEDICAL CARE IF:   You have new symptoms.  You cough up pus.  Your cough does not get better after 2-3 weeks, or your cough gets worse.  You cannot control your cough with suppressant medicines and you are losing sleep.  You develop pain that is getting worse or pain that is not controlled with pain medicines.  You have a fever.  You have unexplained weight loss.  You have night sweats. SEEK IMMEDIATE MEDICAL CARE IF:  You cough up blood.  You have difficulty breathing.  Your heartbeat is very fast.   This information is not intended to replace advice given to you by your health care provider. Make sure you discuss any questions you have with your health care provider.   Document Released: 08/04/2010 Document Revised: 10/27/2014 Document Reviewed: 04/14/2014 Elsevier Interactive Patient Education [employer].

## 2015-05-13 NOTE — ED Provider Notes (Signed)
[facility] Emergency Department Provider Note     Time seen: ----------------------------------------- 12:07 PM on 05/13/2015 -----------------------------------------    I have reviewed the triage vital signs and the nursing notes.   HISTORY  Chief Complaint Cough    HPI [name] is a 65 y.o. female who presents to ER for cough and shortness of breath. Patient states she has history of COPD, has had coughing and a sore chest with coughing, she's had itchy watery eyes and sneezing a lot. Patient can't remember if she takes an allergy medication or not, denies fevers chills or other complaints. She states most of her family is sick right now.   Past Medical History  Diagnosis Date  . Hypertension   . COPD (chronic obstructive pulmonary disease) ([place])   . Heart murmur     Long time ago-  not now  . Asthma   . Shortness of breath dyspnea     with exertion  . Anxiety     Panic attacks- in past.  . GERD (gastroesophageal reflux disease)   . Sleep apnea     wears CPAP  . Back pain   . Allergy   . Arthritis     osteoarthritis - entire body per pt  . Cataract     bil catracts removed  . Depression   . Hyperlipidemia   . Thyroid disease     hypothyroidism    Patient Active Problem List   Diagnosis Date Noted  . Myelopathy ([place]) 10/27/2014  . HYPOTHYROIDISM 09/24/2006  . HYPERTENSION 09/24/2006  . BACK PAIN, CHRONIC, HX OF 09/24/2006  . MIGRAINES, HX OF 09/24/2006  . CARPAL TUNNEL RELEASE, RIGHT, HX OF 09/24/2006  . OSTEOARTHRITIS, LUMBOSACRAL SPINE 08/02/2005    Past Surgical History  Procedure Laterality Date  . Knee arthrocentesis Left     x2  . Wrist arthroplasty Left     Ulna shorter   . Abdominal hysterectomy    . Carpal tunnel release Bilateral   . Cardiac catheterization  06/15/14  . Anterior cervical decomp/discectomy fusion N/A 10/27/2014    Procedure: ANTERIOR CERVICAL DECOMPRESSION/DISCECTOMY FUSION C4 - C6   2  LEVELS;  Surgeon: Melina Schools, MD;  Location: [place];  Service: Orthopedics;  Laterality: N/A;  . Colonoscopy    . Upper gastrointestinal endoscopy      Allergies Bee venom; Codeine; Hydrocodone-acetaminophen; Procaine hcl; Tramadol; and Latex  Social History Social History  Substance Use Topics  . Smoking status: Never Smoker   . Smokeless tobacco: Never Used  . Alcohol Use: 0.0 oz/week    0 Standard drinks or equivalent per week     Comment: occasionally    Review of Systems Constitutional: Negative for fever. Eyes: Negative for visual changes. ENT: Negative for sore throat. Positive congestion and sneezing Cardiovascular: Negative for chest pain. Respiratory: Positive for cough Gastrointestinal: Negative for abdominal pain, vomiting and diarrhea. Genitourinary: Negative for dysuria. Musculoskeletal: Negative for back pain. Skin: Negative for rash. Neurological: Negative for headaches, focal weakness or numbness.  10-point ROS otherwise negative.  ____________________________________________   PHYSICAL EXAM:  VITAL SIGNS: ED Triage Vitals  Enc Vitals Group     BP 05/13/15 1010 157/93 mmHg     Pulse Rate 05/13/15 1010 82     Resp 05/13/15 1010 18     Temp 05/13/15 1010 98.6 F (37 C)     Temp Source 05/13/15 1010 Oral     SpO2 05/13/15 1010 96 %     Weight 05/13/15 1010  204 lb (92.534 kg)     Height 05/13/15 1010 5' (1.524 m)     Head Cir --      Peak Flow --      Pain Score --      Pain Loc --      Pain Edu? --      Excl. in [place]? --     Constitutional: Alert and oriented. Well appearing and in no distress. Eyes: Conjunctivae are normal. PERRL. Normal extraocular movements. ENT   Head: Normocephalic and atraumatic.   Nose: No congestion/rhinnorhea.   Mouth/Throat: Mucous membranes are moist.   Neck: No stridor. Cardiovascular: Normal rate, regular rhythm. Normal and symmetric distal pulses are present in all extremities. No murmurs, rubs,  or gallops. Respiratory: Normal respiratory effort without tachypnea nor retractions. Breath sounds are clear and equal bilaterally. No wheezes/rales/rhonchi. Gastrointestinal: Soft and nontender. No distention. No abdominal bruits.  Musculoskeletal: Nontender with normal range of motion in all extremities. No joint effusions.  No lower extremity tenderness nor edema. Neurologic:  Normal speech and language. No gross focal neurologic deficits are appreciated.  Skin:  Skin is warm, dry and intact. No rash noted. Psychiatric: Mood and affect are normal. Speech and behavior are normal. Patient exhibits appropriate insight and judgment. ____________________________________________  ED COURSE:  Pertinent labs & imaging results that were available during my care of the patient were reviewed by me and considered in my medical decision making (see chart for details). Patient likely with an upper respiratory infection or seasonal allergies. We will obtain an x-ray and reevaluate. ____________________________________________   RADIOLOGY Images were viewed by me  Chest x-ray is unremarkable  ____________________________________________  FINAL ASSESSMENT AND PLAN  URI  Plan: Patient with imaging as dictated above. Patient is in no acute distress, no wheezing or abnormal breath sounds, chest x-ray is negative. She'll be placed on cough medication with prednisone. I do not feel like antibiotics are necessary at this time.   Earleen Newport, MD   Earleen Newport, MD 05/13/15 [phone]

## 2015-05-13 NOTE — ED Notes (Signed)
Reports cough and sob.  No resp distress.

## 2015-05-15 DIAGNOSIS — K635 Polyp of colon: Secondary | ICD-10-CM | POA: Insufficient documentation

## 2015-06-09 ENCOUNTER — Encounter: Payer: Self-pay | Admitting: *Deleted

## 2015-06-19 ENCOUNTER — Emergency Department (HOSPITAL_COMMUNITY): Payer: Medicare Other

## 2015-06-19 ENCOUNTER — Inpatient Hospital Stay (HOSPITAL_COMMUNITY): Payer: Medicare Other

## 2015-06-19 ENCOUNTER — Inpatient Hospital Stay (HOSPITAL_COMMUNITY)
Admission: EM | Admit: 2015-06-19 | Discharge: 2015-06-27 | DRG: 183 | Disposition: A | Discharge status: Home Health | Payer: Medicare Other | Attending: General Surgery | Admitting: General Surgery

## 2015-06-19 ENCOUNTER — Encounter (HOSPITAL_COMMUNITY): Payer: Self-pay | Admitting: Neurology

## 2015-06-19 DIAGNOSIS — I10 Essential (primary) hypertension: Secondary | ICD-10-CM | POA: Diagnosis present

## 2015-06-19 DIAGNOSIS — R0789 Other chest pain: Secondary | ICD-10-CM

## 2015-06-19 DIAGNOSIS — E669 Obesity, unspecified: Secondary | ICD-10-CM | POA: Diagnosis present

## 2015-06-19 DIAGNOSIS — Z884 Allergy status to anesthetic agent status: Secondary | ICD-10-CM

## 2015-06-19 DIAGNOSIS — Z800 Family history of malignant neoplasm of digestive organs: Secondary | ICD-10-CM | POA: Diagnosis not present

## 2015-06-19 DIAGNOSIS — Z91040 Latex allergy status: Secondary | ICD-10-CM

## 2015-06-19 DIAGNOSIS — S272XXA Traumatic hemopneumothorax, initial encounter: Secondary | ICD-10-CM | POA: Diagnosis present

## 2015-06-19 DIAGNOSIS — E039 Hypothyroidism, unspecified: Secondary | ICD-10-CM | POA: Diagnosis present

## 2015-06-19 DIAGNOSIS — S2242XA Multiple fractures of ribs, left side, initial encounter for closed fracture: Secondary | ICD-10-CM | POA: Diagnosis present

## 2015-06-19 DIAGNOSIS — I158 Other secondary hypertension: Secondary | ICD-10-CM | POA: Diagnosis not present

## 2015-06-19 DIAGNOSIS — R339 Retention of urine, unspecified: Secondary | ICD-10-CM | POA: Diagnosis not present

## 2015-06-19 DIAGNOSIS — E785 Hyperlipidemia, unspecified: Secondary | ICD-10-CM | POA: Diagnosis present

## 2015-06-19 DIAGNOSIS — D62 Acute posthemorrhagic anemia: Secondary | ICD-10-CM | POA: Diagnosis not present

## 2015-06-19 DIAGNOSIS — R400 Somnolence: Secondary | ICD-10-CM | POA: Diagnosis not present

## 2015-06-19 DIAGNOSIS — M542 Cervicalgia: Secondary | ICD-10-CM | POA: Diagnosis present

## 2015-06-19 DIAGNOSIS — Z8249 Family history of ischemic heart disease and other diseases of the circulatory system: Secondary | ICD-10-CM

## 2015-06-19 DIAGNOSIS — Z6839 Body mass index (BMI) 39.0-39.9, adult: Secondary | ICD-10-CM

## 2015-06-19 DIAGNOSIS — K219 Gastro-esophageal reflux disease without esophagitis: Secondary | ICD-10-CM | POA: Diagnosis present

## 2015-06-19 DIAGNOSIS — J449 Chronic obstructive pulmonary disease, unspecified: Secondary | ICD-10-CM | POA: Diagnosis present

## 2015-06-19 DIAGNOSIS — Z885 Allergy status to narcotic agent status: Secondary | ICD-10-CM

## 2015-06-19 DIAGNOSIS — R000 Tachycardia, unspecified: Secondary | ICD-10-CM | POA: Diagnosis not present

## 2015-06-19 DIAGNOSIS — Z91030 Bee allergy status: Secondary | ICD-10-CM | POA: Diagnosis not present

## 2015-06-19 DIAGNOSIS — G4730 Sleep apnea, unspecified: Secondary | ICD-10-CM | POA: Diagnosis present

## 2015-06-19 DIAGNOSIS — J939 Pneumothorax, unspecified: Secondary | ICD-10-CM

## 2015-06-19 DIAGNOSIS — R509 Fever, unspecified: Secondary | ICD-10-CM | POA: Diagnosis not present

## 2015-06-19 DIAGNOSIS — J9811 Atelectasis: Secondary | ICD-10-CM

## 2015-06-19 DIAGNOSIS — Z87892 Personal history of anaphylaxis: Secondary | ICD-10-CM | POA: Diagnosis not present

## 2015-06-19 DIAGNOSIS — S2232XA Fracture of one rib, left side, initial encounter for closed fracture: Secondary | ICD-10-CM

## 2015-06-19 DIAGNOSIS — R9431 Abnormal electrocardiogram [ECG] [EKG]: Secondary | ICD-10-CM | POA: Diagnosis not present

## 2015-06-19 HISTORY — DX: Hemothorax: J94.2

## 2015-06-19 LAB — CBC
HCT: 40.2 % (ref 36.0–46.0)
Hemoglobin: 13.6 g/dL (ref 12.0–15.0)
MCH: 30.2 pg (ref 26.0–34.0)
MCHC: 33.8 g/dL (ref 30.0–36.0)
MCV: 89.1 fL (ref 78.0–100.0)
PLATELETS: 225 10*3/uL (ref 150–400)
RBC: 4.51 MIL/uL (ref 3.87–5.11)
RDW: 14.9 % (ref 11.5–15.5)
WBC: 8.1 10*3/uL (ref 4.0–10.5)

## 2015-06-19 LAB — COMPREHENSIVE METABOLIC PANEL
ALK PHOS: 91 U/L (ref 38–126)
ALT: 26 U/L (ref 14–54)
ANION GAP: 12 (ref 5–15)
AST: 26 U/L (ref 15–41)
Albumin: 3.8 g/dL (ref 3.5–5.0)
BILIRUBIN TOTAL: 0.9 mg/dL (ref 0.3–1.2)
BUN: 15 mg/dL (ref 6–20)
CALCIUM: 9.3 mg/dL (ref 8.9–10.3)
CO2: 26 mmol/L (ref 22–32)
CREATININE: 0.84 mg/dL (ref 0.44–1.00)
Chloride: 103 mmol/L (ref 101–111)
Glucose, Bld: 137 mg/dL — ABNORMAL HIGH (ref 65–99)
Potassium: 3.6 mmol/L (ref 3.5–5.1)
SODIUM: 141 mmol/L (ref 135–145)
TOTAL PROTEIN: 6.6 g/dL (ref 6.5–8.1)

## 2015-06-19 LAB — ETHANOL

## 2015-06-19 LAB — SAMPLE TO BLOOD BANK

## 2015-06-19 LAB — CDS SEROLOGY

## 2015-06-19 MED ORDER — IPRATROPIUM-ALBUTEROL 0.5-2.5 (3) MG/3ML IN SOLN
3.0000 mL | Freq: Four times a day (QID) | RESPIRATORY_TRACT | Status: DC
  Administered 2015-06-20 – 2015-06-23 (×16): 3 mL via RESPIRATORY_TRACT
  Filled 2015-06-19 (×16): qty 3

## 2015-06-19 MED ORDER — IOPAMIDOL (ISOVUE-300) INJECTION 61%
INTRAVENOUS | Status: AC
  Administered 2015-06-19: 100 mL
  Filled 2015-06-19: qty 100

## 2015-06-19 MED ORDER — ENOXAPARIN SODIUM 40 MG/0.4ML ~~LOC~~ SOLN
40.0000 mg | Freq: Every day | SUBCUTANEOUS | Status: DC
  Filled 2015-06-19: qty 0.4

## 2015-06-19 MED ORDER — SODIUM CHLORIDE 0.9 % IV BOLUS (SEPSIS)
500.0000 mL | Freq: Once | INTRAVENOUS | Status: AC
  Administered 2015-06-19: 500 mL via INTRAVENOUS

## 2015-06-19 MED ORDER — MORPHINE SULFATE (PF) 2 MG/ML IV SOLN
2.0000 mg | INTRAVENOUS | Status: DC | PRN
  Administered 2015-06-19: 2 mg via INTRAVENOUS
  Administered 2015-06-19: 4 mg via INTRAVENOUS
  Administered 2015-06-20: 2 mg via INTRAVENOUS
  Filled 2015-06-19 (×2): qty 1
  Filled 2015-06-19 (×2): qty 2

## 2015-06-19 MED ORDER — MOMETASONE FURO-FORMOTEROL FUM 200-5 MCG/ACT IN AERO
2.0000 | INHALATION_SPRAY | Freq: Two times a day (BID) | RESPIRATORY_TRACT | Status: DC
  Administered 2015-06-20 – 2015-06-27 (×15): 2 via RESPIRATORY_TRACT
  Filled 2015-06-19: qty 8.8

## 2015-06-19 MED ORDER — DOCUSATE SODIUM 100 MG PO CAPS
100.0000 mg | ORAL_CAPSULE | Freq: Two times a day (BID) | ORAL | Status: DC
  Administered 2015-06-20 – 2015-06-27 (×15): 100 mg via ORAL
  Filled 2015-06-19 (×16): qty 1

## 2015-06-19 MED ORDER — ONDANSETRON HCL 4 MG/2ML IJ SOLN
4.0000 mg | Freq: Four times a day (QID) | INTRAMUSCULAR | Status: DC | PRN
  Administered 2015-06-19: 4 mg via INTRAVENOUS
  Filled 2015-06-19 (×2): qty 2

## 2015-06-19 MED ORDER — OXYCODONE HCL 5 MG PO TABS
5.0000 mg | ORAL_TABLET | ORAL | Status: DC | PRN
  Administered 2015-06-19 – 2015-06-20 (×3): 5 mg via ORAL
  Filled 2015-06-19 (×3): qty 1

## 2015-06-19 MED ORDER — MORPHINE SULFATE (PF) 2 MG/ML IV SOLN
2.0000 mg | INTRAVENOUS | Status: DC | PRN
  Administered 2015-06-19: 2 mg via INTRAVENOUS
  Administered 2015-06-20: 4 mg via INTRAVENOUS
  Filled 2015-06-19: qty 2

## 2015-06-19 MED ORDER — ACETAMINOPHEN 325 MG PO TABS
650.0000 mg | ORAL_TABLET | ORAL | Status: DC | PRN
  Administered 2015-06-20 – 2015-06-22 (×3): 650 mg via ORAL
  Filled 2015-06-19 (×3): qty 2

## 2015-06-19 MED ORDER — ONDANSETRON HCL 4 MG/2ML IJ SOLN: 4.0000 mg | Freq: Four times a day (QID) | INTRAMUSCULAR | Status: DC | PRN

## 2015-06-19 MED ORDER — FENTANYL CITRATE (PF) 100 MCG/2ML IJ SOLN
100.0000 ug | Freq: Once | INTRAMUSCULAR | Status: AC
  Administered 2015-06-19: 100 ug via INTRAVENOUS
  Filled 2015-06-19: qty 2

## 2015-06-19 MED ORDER — LEVOTHYROXINE SODIUM 25 MCG PO TABS
125.0000 ug | ORAL_TABLET | Freq: Every day | ORAL | Status: DC
  Administered 2015-06-20 – 2015-06-27 (×8): 125 ug via ORAL
  Filled 2015-06-19 (×9): qty 1

## 2015-06-19 MED ORDER — HYDROMORPHONE HCL 1 MG/ML IJ SOLN
1.0000 mg | Freq: Once | INTRAMUSCULAR | Status: AC
  Administered 2015-06-19: 1 mg via INTRAVENOUS
  Filled 2015-06-19: qty 1

## 2015-06-19 MED ORDER — ONDANSETRON HCL 4 MG PO TABS: 4.0000 mg | ORAL_TABLET | Freq: Four times a day (QID) | ORAL | Status: DC | PRN
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
## 2015-06-19 NOTE — ED Notes (Signed)
Attempt to have patient do incentive spirometry, pt unable due to pain. Is 100% 2L [place]. MD at bedside to talk with patient.
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
## 2015-06-19 NOTE — ED Notes (Signed)
Patient transported to CT 

## 2015-06-19 NOTE — ED Notes (Signed)
Called CT, they report she is next to come to scanner.

## 2015-06-19 NOTE — ED Notes (Signed)
Patient placed on 2L O2 nasal cannula d/t SpO2 dropping to upper 70s/low 80s on RA.
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
## 2015-06-20 ENCOUNTER — Encounter (HOSPITAL_COMMUNITY): Payer: Self-pay | Admitting: General Practice

## 2015-06-20 ENCOUNTER — Inpatient Hospital Stay (HOSPITAL_COMMUNITY): Payer: Medicare Other

## 2015-06-20 DIAGNOSIS — J942 Hemothorax: Secondary | ICD-10-CM

## 2015-06-20 DIAGNOSIS — S2242XA Multiple fractures of ribs, left side, initial encounter for closed fracture: Secondary | ICD-10-CM | POA: Diagnosis present

## 2015-06-20 HISTORY — DX: Hemothorax: J94.2

## 2015-06-20 LAB — BASIC METABOLIC PANEL
Anion gap: 10 (ref 5–15)
BUN: 13 mg/dL (ref 6–20)
CHLORIDE: 101 mmol/L (ref 101–111)
CO2: 29 mmol/L (ref 22–32)
CREATININE: 0.66 mg/dL (ref 0.44–1.00)
Calcium: 8.8 mg/dL — ABNORMAL LOW (ref 8.9–10.3)
Glucose, Bld: 140 mg/dL — ABNORMAL HIGH (ref 65–99)
POTASSIUM: 3.2 mmol/L — AB (ref 3.5–5.1)
SODIUM: 140 mmol/L (ref 135–145)

## 2015-06-20 LAB — CBC
HCT: 39.9 % (ref 36.0–46.0)
HEMOGLOBIN: 13.5 g/dL (ref 12.0–15.0)
MCH: 30.8 pg (ref 26.0–34.0)
MCHC: 33.8 g/dL (ref 30.0–36.0)
MCV: 90.9 fL (ref 78.0–100.0)
Platelets: 235 10*3/uL (ref 150–400)
RBC: 4.39 MIL/uL (ref 3.87–5.11)
RDW: 15.1 % (ref 11.5–15.5)
WBC: 10.6 10*3/uL — AB (ref 4.0–10.5)

## 2015-06-20 LAB — URINALYSIS, ROUTINE W REFLEX MICROSCOPIC
BILIRUBIN URINE: NEGATIVE
Glucose, UA: NEGATIVE mg/dL
HGB URINE DIPSTICK: NEGATIVE
KETONES UR: NEGATIVE mg/dL
Leukocytes, UA: NEGATIVE
Nitrite: NEGATIVE
PROTEIN: NEGATIVE mg/dL
SPECIFIC GRAVITY, URINE: 1.02 (ref 1.005–1.030)
pH: 5 (ref 5.0–8.0)

## 2015-06-20 LAB — SURGICAL PCR SCREEN
MRSA, PCR: POSITIVE — AB
Staphylococcus aureus: POSITIVE — AB

## 2015-06-20 MED ORDER — CHLORHEXIDINE GLUCONATE CLOTH 2 % EX PADS
6.0000 | MEDICATED_PAD | Freq: Every day | CUTANEOUS | Status: AC
  Administered 2015-06-21 – 2015-06-25 (×5): 6 via TOPICAL

## 2015-06-20 MED ORDER — ATORVASTATIN CALCIUM 80 MG PO TABS
80.0000 mg | ORAL_TABLET | Freq: Every day | ORAL | Status: DC
  Administered 2015-06-20 – 2015-06-26 (×7): 80 mg via ORAL
  Filled 2015-06-20 (×7): qty 1

## 2015-06-20 MED ORDER — ENOXAPARIN SODIUM 30 MG/0.3ML ~~LOC~~ SOLN
30.0000 mg | Freq: Two times a day (BID) | SUBCUTANEOUS | Status: DC
  Administered 2015-06-20 – 2015-06-22 (×5): 30 mg via SUBCUTANEOUS
  Filled 2015-06-20 (×6): qty 0.3

## 2015-06-20 MED ORDER — GABAPENTIN 300 MG PO CAPS
300.0000 mg | ORAL_CAPSULE | Freq: Three times a day (TID) | ORAL | Status: DC
  Administered 2015-06-20 – 2015-06-27 (×22): 300 mg via ORAL
  Filled 2015-06-20 (×22): qty 1

## 2015-06-20 MED ORDER — MUPIROCIN 2 % EX OINT: TOPICAL_OINTMENT | Freq: Two times a day (BID) | CUTANEOUS | Status: DC

## 2015-06-20 MED ORDER — ALBUTEROL SULFATE (2.5 MG/3ML) 0.083% IN NEBU: 2.5000 mg | INHALATION_SOLUTION | Freq: Four times a day (QID) | RESPIRATORY_TRACT | Status: DC | PRN

## 2015-06-20 MED ORDER — FLUTICASONE PROPIONATE 50 MCG/ACT NA SUSP: 1.0000 | Freq: Every day | NASAL | Status: DC | PRN

## 2015-06-20 MED ORDER — SODIUM CHLORIDE 0.9% FLUSH: 9.0000 mL | INTRAVENOUS | Status: DC | PRN

## 2015-06-20 MED ORDER — LORATADINE 10 MG PO TABS
10.0000 mg | ORAL_TABLET | Freq: Every day | ORAL | Status: DC
  Administered 2015-06-20 – 2015-06-27 (×8): 10 mg via ORAL
  Filled 2015-06-20 (×8): qty 1

## 2015-06-20 MED ORDER — DULOXETINE HCL 30 MG PO CPEP
30.0000 mg | ORAL_CAPSULE | Freq: Two times a day (BID) | ORAL | Status: DC
  Administered 2015-06-20 – 2015-06-27 (×14): 30 mg via ORAL
  Filled 2015-06-20 (×17): qty 1

## 2015-06-20 MED ORDER — MUPIROCIN 2 % EX OINT
1.0000 | TOPICAL_OINTMENT | Freq: Two times a day (BID) | CUTANEOUS | Status: AC
Start: 2015-06-20 — End: 2015-06-25
  Administered 2015-06-20 – 2015-06-24 (×9): 1 via NASAL
  Filled 2015-06-20 (×4): qty 22

## 2015-06-20 MED ORDER — NITROGLYCERIN 0.4 MG SL SUBL
0.4000 mg | SUBLINGUAL_TABLET | SUBLINGUAL | Status: DC | PRN
Start: 2015-06-20 — End: 2015-06-27

## 2015-06-20 MED ORDER — KETOROLAC TROMETHAMINE 15 MG/ML IJ SOLN
30.0000 mg | Freq: Four times a day (QID) | INTRAMUSCULAR | Status: DC
  Administered 2015-06-20 – 2015-06-21 (×5): 30 mg via INTRAVENOUS
  Filled 2015-06-20 (×5): qty 2

## 2015-06-20 MED ORDER — HYDROMORPHONE 1 MG/ML IV SOLN
INTRAVENOUS | Status: DC
  Administered 2015-06-20: 0.6 mg via INTRAVENOUS
  Administered 2015-06-20: 1.5 mg via INTRAVENOUS
  Administered 2015-06-20: 2.4 mg via INTRAVENOUS
  Filled 2015-06-20: qty 25

## 2015-06-20 MED ORDER — HYDROCHLOROTHIAZIDE 25 MG PO TABS
25.0000 mg | ORAL_TABLET | Freq: Every day | ORAL | Status: DC
  Administered 2015-06-20: 25 mg via ORAL
  Filled 2015-06-20 (×2): qty 1

## 2015-06-20 MED ORDER — NALOXONE HCL 0.4 MG/ML IJ SOLN: 0.4000 mg | INTRAMUSCULAR | Status: DC | PRN

## 2015-06-20 MED ORDER — DIPHENHYDRAMINE HCL 12.5 MG/5ML PO ELIX
12.5000 mg | ORAL_SOLUTION | Freq: Four times a day (QID) | ORAL | Status: DC | PRN
  Administered 2015-06-20: 12.5 mg via ORAL
  Filled 2015-06-20: qty 10

## 2015-06-20 MED ORDER — ALBUTEROL SULFATE HFA 108 (90 BASE) MCG/ACT IN AERS: 2.0000 | INHALATION_SPRAY | Freq: Four times a day (QID) | RESPIRATORY_TRACT | Status: DC | PRN

## 2015-06-20 MED ORDER — DIAZEPAM 5 MG PO TABS
5.0000 mg | ORAL_TABLET | Freq: Three times a day (TID) | ORAL | Status: DC
  Administered 2015-06-20 – 2015-06-27 (×19): 5 mg via ORAL
  Filled 2015-06-20 (×20): qty 1

## 2015-06-20 MED ORDER — METOPROLOL TARTRATE 50 MG PO TABS
50.0000 mg | ORAL_TABLET | Freq: Two times a day (BID) | ORAL | Status: DC
  Administered 2015-06-20 – 2015-06-27 (×14): 50 mg via ORAL
  Filled 2015-06-20 (×15): qty 1

## 2015-06-20 MED ORDER — LISINOPRIL 10 MG PO TABS
10.0000 mg | ORAL_TABLET | Freq: Two times a day (BID) | ORAL | Status: DC
  Administered 2015-06-20 (×2): 10 mg via ORAL
  Filled 2015-06-20 (×3): qty 1

## 2015-06-20 MED ORDER — LINACLOTIDE 145 MCG PO CAPS
145.0000 ug | ORAL_CAPSULE | Freq: Every day | ORAL | Status: DC
  Administered 2015-06-22: 145 ug via ORAL
  Filled 2015-06-20 (×8): qty 1

## 2015-06-20 MED ORDER — POLYETHYLENE GLYCOL 3350 17 G PO PACK
17.0000 g | PACK | Freq: Every day | ORAL | Status: DC
  Administered 2015-06-20 – 2015-06-25 (×4): 17 g via ORAL
  Filled 2015-06-20 (×7): qty 1

## 2015-06-20 MED ORDER — DIPHENHYDRAMINE HCL 50 MG/ML IJ SOLN: 12.5000 mg | Freq: Four times a day (QID) | INTRAMUSCULAR | Status: DC | PRN

## 2015-06-20 NOTE — ED Notes (Signed)
Attempted report 
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
## 2015-06-20 NOTE — ED Notes (Signed)
Trauma MD at bedside.  Pt using incentive spirometer.  Pain remains uncontrolled.

## 2015-06-20 NOTE — ED Notes (Signed)
Pt back from X-ray.  

## 2015-06-20 NOTE — ED Notes (Signed)
PT states minimal relief with toradol, but is continuing to use inhaler.
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
## 2015-06-21 ENCOUNTER — Inpatient Hospital Stay (HOSPITAL_COMMUNITY): Payer: Medicare Other

## 2015-06-21 DIAGNOSIS — I10 Essential (primary) hypertension: Secondary | ICD-10-CM

## 2015-06-21 DIAGNOSIS — R339 Retention of urine, unspecified: Secondary | ICD-10-CM | POA: Diagnosis not present

## 2015-06-21 DIAGNOSIS — D62 Acute posthemorrhagic anemia: Secondary | ICD-10-CM | POA: Diagnosis not present

## 2015-06-21 DIAGNOSIS — I158 Other secondary hypertension: Secondary | ICD-10-CM

## 2015-06-21 LAB — BASIC METABOLIC PANEL
Anion gap: 11 (ref 5–15)
Anion gap: 11 (ref 5–15)
BUN: 22 mg/dL — AB (ref 6–20)
BUN: 23 mg/dL — AB (ref 6–20)
CALCIUM: 8.8 mg/dL — AB (ref 8.9–10.3)
CALCIUM: 9 mg/dL (ref 8.9–10.3)
CHLORIDE: 99 mmol/L — AB (ref 101–111)
CO2: 29 mmol/L (ref 22–32)
CO2: 29 mmol/L (ref 22–32)
CREATININE: 1.53 mg/dL — AB (ref 0.44–1.00)
CREATININE: 1.07 mg/dL — AB (ref 0.44–1.00)
Chloride: 100 mmol/L — ABNORMAL LOW (ref 101–111)
GFR calc Af Amer: >60 mL/min (ref >60)
GFR calc non Af Amer: 35 mL/min — ABNORMAL LOW (ref >60)
GFR, EST AFRICAN AMERICAN: 40 mL/min — AB (ref >60)
GFR, EST NON AFRICAN AMERICAN: 54 mL/min — AB (ref >60)
Glucose, Bld: 115 mg/dL — ABNORMAL HIGH (ref 65–99)
Glucose, Bld: 156 mg/dL — ABNORMAL HIGH (ref 65–99)
Potassium: 3.5 mmol/L (ref 3.5–5.1)
Potassium: 3.7 mmol/L (ref 3.5–5.1)
SODIUM: 139 mmol/L (ref 135–145)
SODIUM: 140 mmol/L (ref 135–145)

## 2015-06-21 LAB — TROPONIN I: TROPONIN I: 0.04 ng/mL — AB (ref <0.031)

## 2015-06-21 LAB — CBC
HCT: 35.3 % — ABNORMAL LOW (ref 36.0–46.0)
HCT: 35.6 % — ABNORMAL LOW (ref 36.0–46.0)
Hemoglobin: 11.6 g/dL — ABNORMAL LOW (ref 12.0–15.0)
Hemoglobin: 11.9 g/dL — ABNORMAL LOW (ref 12.0–15.0)
MCH: 29.7 pg (ref 26.0–34.0)
MCH: 29.8 pg (ref 26.0–34.0)
MCHC: 32.9 g/dL (ref 30.0–36.0)
MCHC: 33.4 g/dL (ref 30.0–36.0)
MCV: 90.5 fL (ref 78.0–100.0)
MCV: 89.2 fL (ref 78.0–100.0)
PLATELETS: 228 10*3/uL (ref 150–400)
Platelets: 204 10*3/uL (ref 150–400)
RBC: 3.9 MIL/uL (ref 3.87–5.11)
RBC: 3.99 MIL/uL (ref 3.87–5.11)
RDW: 15.7 % — AB (ref 11.5–15.5)
RDW: 15.1 % (ref 11.5–15.5)
WBC: 9.3 10*3/uL (ref 4.0–10.5)
WBC: 9.3 10*3/uL (ref 4.0–10.5)

## 2015-06-21 LAB — CK TOTAL AND CKMB (NOT AT ARMC)
CK, MB: 2.3 ng/mL (ref 0.5–5.0)
RELATIVE INDEX: 1.7 (ref 0.0–2.5)
Total CK: 136 U/L (ref 38–234)

## 2015-06-21 LAB — LACTIC ACID, PLASMA
LACTIC ACID, VENOUS: 1.6 mmol/L (ref 0.5–2.0)
Lactic Acid, Venous: 1.5 mmol/L (ref 0.5–2.0)

## 2015-06-21 MED ORDER — PIPERACILLIN-TAZOBACTAM 3.375 G IVPB
3.3750 g | Freq: Three times a day (TID) | INTRAVENOUS | Status: DC
  Filled 2015-06-21 (×2): qty 50

## 2015-06-21 MED ORDER — ACETAMINOPHEN 650 MG RE SUPP
650.0000 mg | RECTAL | Status: DC | PRN
  Administered 2015-06-21: 650 mg via RECTAL

## 2015-06-21 MED ORDER — PIPERACILLIN-TAZOBACTAM 3.375 G IVPB
3.3750 g | Freq: Three times a day (TID) | INTRAVENOUS | Status: DC
  Administered 2015-06-21 – 2015-06-26 (×14): 3.375 g via INTRAVENOUS
  Filled 2015-06-21 (×19): qty 50

## 2015-06-21 MED ORDER — POTASSIUM CHLORIDE 2 MEQ/ML IV SOLN
INTRAVENOUS | Status: DC
  Administered 2015-06-21 – 2015-06-27 (×5): via INTRAVENOUS
  Filled 2015-06-21 (×13): qty 1000

## 2015-06-21 MED ORDER — HYDROMORPHONE HCL 1 MG/ML IJ SOLN
1.0000 mg | INTRAMUSCULAR | Status: DC | PRN
  Administered 2015-06-21: 1 mg via INTRAVENOUS
  Filled 2015-06-21: qty 1

## 2015-06-21 MED ORDER — VANCOMYCIN HCL 10 G IV SOLR
2000.0000 mg | Freq: Once | INTRAVENOUS | Status: AC
  Administered 2015-06-21: 2000 mg via INTRAVENOUS
  Filled 2015-06-21: qty 2000

## 2015-06-21 MED ORDER — BETHANECHOL CHLORIDE 25 MG PO TABS
25.0000 mg | ORAL_TABLET | Freq: Three times a day (TID) | ORAL | Status: DC
  Administered 2015-06-21 – 2015-06-23 (×10): 25 mg via ORAL
  Filled 2015-06-21 (×13): qty 1

## 2015-06-21 MED ORDER — OXYCODONE HCL 5 MG PO TABS
10.0000 mg | ORAL_TABLET | ORAL | Status: DC | PRN
  Administered 2015-06-21 (×2): 20 mg via ORAL
  Filled 2015-06-21 (×2): qty 4

## 2015-06-21 MED ORDER — VANCOMYCIN HCL 10 G IV SOLR: 1500.0000 mg | INTRAVENOUS | Status: DC

## 2015-06-21 MED ORDER — HYDROMORPHONE HCL 1 MG/ML IJ SOLN
0.5000 mg | INTRAMUSCULAR | Status: DC | PRN
  Administered 2015-06-21 – 2015-06-25 (×3): 0.5 mg via INTRAVENOUS
  Filled 2015-06-21 (×3): qty 1

## 2015-06-21 MED ORDER — VANCOMYCIN HCL 500 MG IV SOLR: 500.0000 mg | Freq: Two times a day (BID) | INTRAVENOUS | Status: DC

## 2015-06-21 NOTE — Progress Notes (Signed)
Called by nurse due to sudden increase of HR into the 140's with saturations of 96%  CXR shows cardiomegaly without obvious PTX per  Radiology and congestion  Some mild EKG changes  Sleeping but having pain from left rib fractures   Will transfer to SDU and ask cardiology to see  Check cardiac enzymes  On B blocker for HTN
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
## 2015-06-21 NOTE — Progress Notes (Addendum)
Pt HR sustaining into 140`s. EKG results- sinus tach, T wave abnormality, consider inferior ischemia. Dr Brantley Stage made aware with new orders. Pt to transfer to [place], gave report to Esmeralda

## 2015-06-21 NOTE — Progress Notes (Signed)
Patient's pulse was within normal range until earlier this afternoon- pulse has stayed in the 130's. MD Cornett paged. Verbal orders given for an ekg and a portable chest x-ray.

## 2015-06-21 NOTE — Progress Notes (Signed)
Call received per Patient Placement at 1945 to assess Pt for SDU admission. Pt found resting in. RT at bedside completing NEB treatment. Pt lethargic, able to state name and follow simple commands. Lung sounds diminished. Cardiologist at bedside completing assessment. Rectal temp done, 104. HR 140s ST, BP 153/84, RR 20, Po2 98% on 3 LNC. Lactic Acid and blood cultures drawn. ABG drawn after multiple attempts. Tylenol PR given and Ice bags placed on Patient. Dr.Cornett paged and updated per floor RN regarding new VS. IV ABX, CBC, Bmet ordered for tonight.  Pt transferred to 2 H26 at 2140
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
## 2015-06-21 NOTE — Progress Notes (Signed)
EKG results- sinus tach, T wave abnormality, consider inferior ischemia. MD Cornett paged concerning reading. Awaiting call back- will pass on to night RN
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
## 2015-06-21 NOTE — Progress Notes (Signed)
RT note: Patient extremely hard to obtain ABG from. Results ran on istat do to small sample. Results will not down load until 06/22/2015 day shift. Results were as follows ph 7.38 PCO2 52.7 PO2 72 HCO3 30.5 BE 5.

## 2015-06-22 ENCOUNTER — Ambulatory Visit: Payer: Medicare Other | Admitting: Internal Medicine

## 2015-06-22 DIAGNOSIS — R000 Tachycardia, unspecified: Secondary | ICD-10-CM

## 2015-06-22 LAB — CBC
HCT: 36 % (ref 36.0–46.0)
Hemoglobin: 11.9 g/dL — ABNORMAL LOW (ref 12.0–15.0)
MCH: 29.8 pg (ref 26.0–34.0)
MCHC: 33.1 g/dL (ref 30.0–36.0)
MCV: 90.2 fL (ref 78.0–100.0)
PLATELETS: 230 10*3/uL (ref 150–400)
RBC: 3.99 MIL/uL (ref 3.87–5.11)
RDW: 15.3 % (ref 11.5–15.5)
WBC: 9.7 10*3/uL (ref 4.0–10.5)

## 2015-06-22 LAB — BASIC METABOLIC PANEL
ANION GAP: 6 (ref 5–15)
BUN: 23 mg/dL — ABNORMAL HIGH (ref 6–20)
CALCIUM: 8.9 mg/dL (ref 8.9–10.3)
CO2: 33 mmol/L — ABNORMAL HIGH (ref 22–32)
Chloride: 104 mmol/L (ref 101–111)
Creatinine, Ser: 1.05 mg/dL — ABNORMAL HIGH (ref 0.44–1.00)
GFR, EST NON AFRICAN AMERICAN: 55 mL/min — AB (ref >60)
GLUCOSE: 147 mg/dL — AB (ref 65–99)
POTASSIUM: 3.9 mmol/L (ref 3.5–5.1)
SODIUM: 143 mmol/L (ref 135–145)

## 2015-06-22 LAB — URINALYSIS, ROUTINE W REFLEX MICROSCOPIC
Bilirubin Urine: NEGATIVE
GLUCOSE, UA: 100 mg/dL — AB
HGB URINE DIPSTICK: NEGATIVE
Ketones, ur: NEGATIVE mg/dL
Leukocytes, UA: NEGATIVE
Nitrite: NEGATIVE
Protein, ur: NEGATIVE mg/dL
Specific Gravity, Urine: 1.025 (ref 1.005–1.030)
pH: 5.5 (ref 5.0–8.0)

## 2015-06-22 LAB — POCT I-STAT 3, ART BLOOD GAS (G3+)
Acid-Base Excess: 5 mmol/L — ABNORMAL HIGH (ref 0.0–2.0)
Acid-Base Excess: 6 mmol/L — ABNORMAL HIGH (ref 0.0–2.0)
Bicarbonate: 30.5 meq/L — ABNORMAL HIGH (ref 20.0–24.0)
Bicarbonate: 32.4 meq/L — ABNORMAL HIGH (ref 20.0–24.0)
O2 SAT: 90 %
O2 SAT: 94 %
PCO2 ART: 52.7 mmHg — AB (ref 35.0–45.0)
PCO2 ART: 56.3 mmHg — AB (ref 35.0–45.0)
PH ART: 7.383 (ref 7.350–7.450)
PO2 ART: 72 mmHg — AB (ref 80.0–100.0)
PO2 ART: 74 mmHg — AB (ref 80.0–100.0)
Patient temperature: 104
Patient temperature: 98.6
TCO2: 32 mmol/L (ref 0–100)
TCO2: 34 mmol/L (ref 0–100)
pH, Arterial: 7.368 (ref 7.350–7.450)

## 2015-06-22 LAB — TROPONIN I
TROPONIN I: 0.06 ng/mL — AB (ref <0.031)
Troponin I: 0.06 ng/mL — ABNORMAL HIGH (ref <0.031)

## 2015-06-22 MED ORDER — OXYCODONE HCL 5 MG PO TABS
5.0000 mg | ORAL_TABLET | ORAL | Status: DC | PRN
  Administered 2015-06-22 – 2015-06-23 (×2): 10 mg via ORAL
  Filled 2015-06-22 (×2): qty 2

## 2015-06-22 MED ORDER — VANCOMYCIN HCL IN DEXTROSE 750-5 MG/150ML-% IV SOLN
750.0000 mg | Freq: Two times a day (BID) | INTRAVENOUS | Status: DC
  Administered 2015-06-22 (×2): 750 mg via INTRAVENOUS
  Filled 2015-06-22 (×3): qty 150
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
## 2015-06-22 NOTE — Care Management Important Message (Signed)
Important Message  Patient Details  Name: [name] MRN: [id number] Date of Birth: 03/06/1950   Medicare Important Message Given:  Yes    Lacretia Leigh, RN 06/22/2015, 10:21 AM

## 2015-06-22 NOTE — Progress Notes (Signed)
RT placed patient on CPAP HS. Patient placed on CPAP of 8 and 40% O2. Patient is tolerating well. RT will continue to monitor as needed.

## 2015-06-22 NOTE — Progress Notes (Signed)
Patient troponin increase from 0.04 to 0.06. Patient has no chest pain, VS stable. NSR at this time. Cardiology has been notified. Will continue to monitor patient. Gildardo Cranker, RN

## 2015-06-23 ENCOUNTER — Inpatient Hospital Stay (HOSPITAL_COMMUNITY): Payer: Medicare Other

## 2015-06-23 DIAGNOSIS — R9431 Abnormal electrocardiogram [ECG] [EKG]: Secondary | ICD-10-CM

## 2015-06-23 LAB — CBC WITH DIFFERENTIAL/PLATELET
BASOS ABS: 0 10*3/uL (ref 0.0–0.1)
BASOS PCT: 0 %
EOS ABS: 0.5 10*3/uL (ref 0.0–0.7)
Eosinophils Relative: 7 %
HCT: 33.7 % — ABNORMAL LOW (ref 36.0–46.0)
HEMOGLOBIN: 11.1 g/dL — AB (ref 12.0–15.0)
Lymphocytes Relative: 14 %
Lymphs Abs: 0.9 10*3/uL (ref 0.7–4.0)
MCH: 30.6 pg (ref 26.0–34.0)
MCHC: 32.9 g/dL (ref 30.0–36.0)
MCV: 92.8 fL (ref 78.0–100.0)
MONOS PCT: 9 %
Monocytes Absolute: 0.6 10*3/uL (ref 0.1–1.0)
NEUTROS ABS: 4.8 10*3/uL (ref 1.7–7.7)
NEUTROS PCT: 70 %
Platelets: 174 10*3/uL (ref 150–400)
RBC: 3.63 MIL/uL — AB (ref 3.87–5.11)
RDW: 15.2 % (ref 11.5–15.5)
WBC: 6.8 10*3/uL (ref 4.0–10.5)

## 2015-06-23 LAB — BASIC METABOLIC PANEL
ANION GAP: 10 (ref 5–15)
BUN: 14 mg/dL (ref 6–20)
CHLORIDE: 102 mmol/L (ref 101–111)
CO2: 30 mmol/L (ref 22–32)
CREATININE: 0.66 mg/dL (ref 0.44–1.00)
Calcium: 8.7 mg/dL — ABNORMAL LOW (ref 8.9–10.3)
GFR calc non Af Amer: >60 mL/min (ref >60)
Glucose, Bld: 160 mg/dL — ABNORMAL HIGH (ref 65–99)
Potassium: 4 mmol/L (ref 3.5–5.1)
SODIUM: 142 mmol/L (ref 135–145)

## 2015-06-23 LAB — ECHOCARDIOGRAM COMPLETE: Weight: 3262.81 oz

## 2015-06-23 MED ORDER — METHOCARBAMOL 750 MG PO TABS
750.0000 mg | ORAL_TABLET | Freq: Three times a day (TID) | ORAL | Status: DC
  Administered 2015-06-23 – 2015-06-27 (×13): 750 mg via ORAL
  Filled 2015-06-23 (×8): qty 1
  Filled 2015-06-23: qty 2
  Filled 2015-06-23 (×4): qty 1

## 2015-06-23 MED ORDER — VANCOMYCIN HCL 10 G IV SOLR
1250.0000 mg | Freq: Two times a day (BID) | INTRAVENOUS | Status: DC
  Administered 2015-06-23 – 2015-06-25 (×6): 1250 mg via INTRAVENOUS
  Filled 2015-06-23 (×10): qty 1250

## 2015-06-23 MED ORDER — OXYCODONE-ACETAMINOPHEN 7.5-325 MG PO TABS
1.0000 | ORAL_TABLET | Freq: Four times a day (QID) | ORAL | Status: DC | PRN
  Administered 2015-06-23 – 2015-06-27 (×10): 2 via ORAL
  Filled 2015-06-23 (×9): qty 2
  Filled 2015-06-23: qty 1

## 2015-06-23 NOTE — Progress Notes (Signed)
Placed patient on CPAP for the night set at 10cmH20.

## 2015-06-23 NOTE — Progress Notes (Signed)
  Echocardiogram 2D Echocardiogram has been performed.  [name] 06/23/2015, 1:21 PM

## 2015-06-23 NOTE — Progress Notes (Signed)
PT Cancellation Note  Patient Details Name: Amaani Haroldsen MRN: [id number] DOB: 1950/12/29   Cancelled Treatment:    Reason Eval/Treat Not Completed: Patient at procedure or test/unavailable. Pt just beginning breakfast and RN reports then moving to 6N. Will reattempt later today as schedule permits   Nancey Kreitz 06/23/2015, 9:09 AM  Pager [phone]
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
## 2015-06-24 DIAGNOSIS — R509 Fever, unspecified: Secondary | ICD-10-CM | POA: Diagnosis not present

## 2015-06-24 MED ORDER — BETHANECHOL CHLORIDE 10 MG PO TABS
10.0000 mg | ORAL_TABLET | Freq: Four times a day (QID) | ORAL | Status: DC
  Administered 2015-06-24 – 2015-06-26 (×8): 10 mg via ORAL
  Filled 2015-06-24 (×9): qty 1

## 2015-06-24 MED ORDER — LISINOPRIL 10 MG PO TABS
10.0000 mg | ORAL_TABLET | Freq: Two times a day (BID) | ORAL | Status: DC
  Administered 2015-06-24 – 2015-06-27 (×7): 10 mg via ORAL
  Filled 2015-06-24 (×7): qty 1

## 2015-06-24 MED ORDER — IPRATROPIUM-ALBUTEROL 0.5-2.5 (3) MG/3ML IN SOLN
3.0000 mL | Freq: Three times a day (TID) | RESPIRATORY_TRACT | Status: DC
  Administered 2015-06-24 – 2015-06-27 (×10): 3 mL via RESPIRATORY_TRACT
  Filled 2015-06-24 (×10): qty 3

## 2015-06-24 NOTE — Progress Notes (Signed)
Placed patient on CPAP for the night set at 10cm
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
## 2015-06-25 NOTE — Progress Notes (Signed)
Placed patient on CPAP set at 10cm for the night  
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
## 2015-06-26 LAB — CULTURE, BLOOD (ROUTINE X 2)
Culture: NO GROWTH
Culture: NO GROWTH

## 2015-06-26 MED ORDER — BETHANECHOL CHLORIDE 10 MG PO TABS
10.0000 mg | ORAL_TABLET | Freq: Two times a day (BID) | ORAL | Status: DC
  Administered 2015-06-26 – 2015-06-27 (×2): 10 mg via ORAL
  Filled 2015-06-26 (×3): qty 1
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
## 2015-06-26 NOTE — Progress Notes (Signed)
Placed patient on CPAP for the night set at 10cm
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
## 2015-06-27 MED ORDER — IPRATROPIUM-ALBUTEROL 0.5-2.5 (3) MG/3ML IN SOLN: 3.0000 mL | RESPIRATORY_TRACT | Status: DC | PRN

## 2015-06-27 MED ORDER — METHOCARBAMOL 500 MG PO TABS: 1000.0000 mg | ORAL_TABLET | Freq: Four times a day (QID) | ORAL | Status: DC | PRN

## 2015-06-27 MED ORDER — OXYCODONE-ACETAMINOPHEN 7.5-325 MG PO TABS: 1.0000 | ORAL_TABLET | ORAL | Status: DC | PRN

## 2015-06-27 NOTE — Care Management Note (Signed)
Case Management Note  Patient Details  Name: [name] MRN: [id number] Date of Birth: 1950-08-24  Subjective/Objective:    Pt medically stable for dc home today with boyfriend.  Pt states boyfriend works, but will be at home in the evenings.                  Action/Plan: Met with pt to discuss dc arrangements.  PT/OT recommending HH follow up and pt agreeable to homecare.   Pt given list of providers for her county; she chooses [place] for [facility] needs.  Referral to [facility] for [facility] follow up.  Start of care 24-48h post dc date.    Expected Discharge Date:   06/27/2015               Expected Discharge Plan:  [place]  In-House Referral:     Discharge planning Services  CM Consult  Post Acute Care Choice:  Home Health Choice offered to:  Patient  DME Arranged:  3-N-1 DME Agency:  [place]:  PT, OT [facility] Agency:  [place]  Status of Service:  Completed, signed off  Medicare Important Message Given:  Yes Date Medicare IM Given:    Medicare IM give by:    Date Additional Medicare IM Given:    Additional Medicare Important Message give by:     If discussed at [place] of Stay Meetings, dates discussed:    Additional Comments:  Reinaldo Raddle, RN, BSN  Trauma/Neuro ICU Case Manager [phone]
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
## 2015-06-27 NOTE — Care Management Important Message (Signed)
Important Message  Patient Details  Name: Branigan Haneline MRN: [id number] Date of Birth: 07-Dec-1950   Medicare Important Message Given:  Yes    Ella Bodo, RN 06/27/2015, 1:28 PM

## 2015-06-27 NOTE — Discharge Instructions (Signed)
Increase activity as pain allows. ° °No driving while taking oxycodone. °

## 2015-09-05 ENCOUNTER — Ambulatory Visit: Payer: Medicare Other | Admitting: Internal Medicine

## 2015-09-13 DIAGNOSIS — S41159A Open bite of unspecified upper arm, initial encounter: Secondary | ICD-10-CM | POA: Insufficient documentation

## 2015-09-13 DIAGNOSIS — T148XXA Other injury of unspecified body region, initial encounter: Secondary | ICD-10-CM | POA: Insufficient documentation

## 2015-09-13 DIAGNOSIS — W540XXA Bitten by dog, initial encounter: Secondary | ICD-10-CM | POA: Insufficient documentation

## 2015-09-17 ENCOUNTER — Other Ambulatory Visit: Payer: Self-pay | Admitting: Internal Medicine

## 2015-09-17 DIAGNOSIS — K5900 Constipation, unspecified: Secondary | ICD-10-CM

## 2015-11-07 ENCOUNTER — Ambulatory Visit: Payer: Medicare Other | Admitting: Internal Medicine

## 2015-12-10 ENCOUNTER — Other Ambulatory Visit: Payer: Self-pay | Admitting: Internal Medicine

## 2015-12-10 DIAGNOSIS — K5900 Constipation, unspecified: Secondary | ICD-10-CM

## 2016-01-20 DIAGNOSIS — Z79899 Other long term (current) drug therapy: Secondary | ICD-10-CM | POA: Insufficient documentation

## 2016-01-31 ENCOUNTER — Ambulatory Visit: Payer: Medicare Other | Admitting: Internal Medicine

## 2016-02-07 ENCOUNTER — Other Ambulatory Visit: Payer: Self-pay | Admitting: *Deleted

## 2016-02-07 DIAGNOSIS — K5900 Constipation, unspecified: Secondary | ICD-10-CM

## 2016-02-07 MED ORDER — LINACLOTIDE 145 MCG PO CAPS
ORAL_CAPSULE | ORAL | 1 refills | Status: DC
Start: 2016-02-07 — End: 2016-03-29

## 2016-03-19 ENCOUNTER — Other Ambulatory Visit: Payer: Self-pay | Admitting: *Deleted

## 2016-03-19 MED ORDER — PANTOPRAZOLE SODIUM 40 MG PO TBEC: 40.0000 mg | DELAYED_RELEASE_TABLET | Freq: Every day | ORAL | 11 refills | Status: DC

## 2016-03-29 ENCOUNTER — Ambulatory Visit (INDEPENDENT_AMBULATORY_CARE_PROVIDER_SITE_OTHER): Payer: Medicare Other | Admitting: Internal Medicine

## 2016-03-29 ENCOUNTER — Encounter: Payer: Self-pay | Admitting: Internal Medicine

## 2016-03-29 VITALS — BP 142/78 | HR 52 | Ht 59.5 in | Wt 205.5 lb

## 2016-03-29 DIAGNOSIS — Z86010 Personal history of colonic polyps: Secondary | ICD-10-CM

## 2016-03-29 DIAGNOSIS — K5909 Other constipation: Secondary | ICD-10-CM | POA: Diagnosis not present

## 2016-03-29 DIAGNOSIS — K219 Gastro-esophageal reflux disease without esophagitis: Secondary | ICD-10-CM

## 2016-03-29 MED ORDER — LINACLOTIDE 290 MCG PO CAPS: 290.0000 ug | ORAL_CAPSULE | Freq: Every day | ORAL | 5 refills | Status: DC

## 2016-03-29 MED ORDER — RANITIDINE HCL 150 MG PO TABS: 150.0000 mg | ORAL_TABLET | Freq: Every evening | ORAL | 5 refills | Status: DC

## 2016-03-29 NOTE — Progress Notes (Signed)
   Subjective:    Patient ID: [name], female    DOB: 1950-06-03, 66 y.o.   MRN: [id number]  HPI Lovelee Gniadek is a 66 year old female with a past medical history of GERD, chronic constipation and colon polyps is here for follow-up. She was initially seen one year ago by Nicoletta Ba, PA-C and then came for upper endoscopy on colonoscopy.  EGD and colonoscopy performed on 04/25/2015 EGD revealed a 2 cm hiatal hernia but was otherwise unremarkable. Colonoscopy revealed mild melanosis in the right colon. An 4 polyps removed all less than a centimeter. There was mild sigmoid diverticulosis. These polyps were found to be tubular adenoma 1, sessile serrated polyp 1 and hyperplastic polyp 2  She has been taking pantoprazole 40 mg daily and Linzess 145 g daily.  She states the Linzess initially seem to work very well but seems to have lost its efficacy. She is now feeling more bloated and more constipated. She's having a bowel movement nearly every day but they can be small and feel incomplete. She is no longer using oxycodone.  Heartburn symptoms also occur despite pantoprazole 40 mg once a day. When this occurs it usually in the evening. It can be related to overeating. She denies dysphagia and odynophagia. She does report feeling full and frequent belching.  No blood in her stool or melena.  She was attacked by dog in July 2017 requiring surgical repair. This has led to some chronic right arm pain.   Review of Systems As per history of present illness, otherwise negative  Current Medications, Allergies, Past Medical History, Past Surgical History, Family History and Social History were reviewed in [employer] record.     Objective:   Physical Exam BP (!) 142/78 (BP Location: Left Arm, Patient Position: Sitting, Cuff Size: Normal)   Pulse (!) 52   Ht 4' 11.5" (1.511 m) Comment: height measured without shoes  Wt 205 lb 8 oz (93.2 kg)   BMI 40.81 kg/m    Constitutional: Well-developed and well-nourished. No distress. HEENT: Normocephalic and atraumatic.  Conjunctivae are normal.  No scleral icterus. Neck: Neck supple. Trachea midline. Cardiovascular: Normal rate, regular rhythm and intact distal pulses.  Pulmonary/chest: Effort normal and breath sounds normal. No wheezing, rales or rhonchi. Abdominal: Soft, obese,  nontender, nondistended. Bowel sounds active throughout.  Extremities: no clubbing, cyanosis, or edema Neurological: Alert and oriented to person place and time. Skin: Skin is warm and dry. No rashes noted. Psychiatric: Normal mood and affect. Behavior is normal.     Assessment & Plan:   66 year old female with a past medical history of GERD, chronic constipation and colon polyps is here for follow-up.  1. GERD -- Incompletely controlled despite once daily PPI. We reviewed GERD diet and reflux precautions which are again recommended. We'll continue pantoprazole 40 mg 30 missed before breakfast and add ranitidine 150 mg every afternoon.  2. Chronic constipation -- still some residual constipation and feeling of incomplete evacuation despite Linzess 145 g daily. Increase Linzess to 290 g daily. Should she develop diarrhea or symptoms of constipation remain she is asked to notify me. She voices understanding  3. History of colon polyps -- 5 year surveillance interval recommended which would be around March 2022  6-12 month follow-up, sooner if necessary 25 minutes spent with the patient today. Greater than 50% was spent in counseling and coordination of care with the patient

## 2016-03-29 NOTE — Patient Instructions (Signed)
We have sent the following medications to your pharmacy for you to pick up at your convenience: Linzess 290 mcg (increase from 145 mcg daily dose) raniditine 150 mg every evening  Continue pantoprazole 40 mg every morning.  Please follow up with Dr [name] in 6-12 months.  If you are age 66 or older, your body mass index should be between 23-30. Your Body mass index is 40.81 kg/m. If this is out of the aforementioned range listed, please consider follow up with your Primary Care Provider.  If you are age 53 or younger, your body mass index should be between 19-25. Your Body mass index is 40.81 kg/m. If this is out of the aformentioned range listed, please consider follow up with your Primary Care Provider.

## 2016-05-17 ENCOUNTER — Emergency Department: Payer: Medicare Other

## 2016-05-17 ENCOUNTER — Emergency Department
Admission: EM | Admit: 2016-05-17 | Discharge: 2016-05-17 | Disposition: A | Discharge status: Home / Self Care | Payer: Medicare Other | Attending: Emergency Medicine | Admitting: Emergency Medicine

## 2016-05-17 ENCOUNTER — Encounter: Payer: Self-pay | Admitting: Emergency Medicine

## 2016-05-17 DIAGNOSIS — Z79899 Other long term (current) drug therapy: Secondary | ICD-10-CM | POA: Insufficient documentation

## 2016-05-17 DIAGNOSIS — Y999 Unspecified external cause status: Secondary | ICD-10-CM | POA: Diagnosis not present

## 2016-05-17 DIAGNOSIS — Y92411 Interstate highway as the place of occurrence of the external cause: Secondary | ICD-10-CM | POA: Diagnosis not present

## 2016-05-17 DIAGNOSIS — I10 Essential (primary) hypertension: Secondary | ICD-10-CM | POA: Insufficient documentation

## 2016-05-17 DIAGNOSIS — S0003XA Contusion of scalp, initial encounter: Secondary | ICD-10-CM | POA: Insufficient documentation

## 2016-05-17 DIAGNOSIS — J45909 Unspecified asthma, uncomplicated: Secondary | ICD-10-CM | POA: Insufficient documentation

## 2016-05-17 DIAGNOSIS — J449 Chronic obstructive pulmonary disease, unspecified: Secondary | ICD-10-CM | POA: Diagnosis not present

## 2016-05-17 DIAGNOSIS — Y939 Activity, unspecified: Secondary | ICD-10-CM | POA: Insufficient documentation

## 2016-05-17 DIAGNOSIS — E039 Hypothyroidism, unspecified: Secondary | ICD-10-CM | POA: Diagnosis not present

## 2016-05-17 DIAGNOSIS — S161XXA Strain of muscle, fascia and tendon at neck level, initial encounter: Secondary | ICD-10-CM

## 2016-05-17 DIAGNOSIS — S0990XA Unspecified injury of head, initial encounter: Secondary | ICD-10-CM | POA: Diagnosis present

## 2016-05-17 MED ORDER — OXYCODONE-ACETAMINOPHEN 5-325 MG PO TABS
1.0000 | ORAL_TABLET | Freq: Once | ORAL | Status: AC
  Administered 2016-05-17: 1 via ORAL
  Filled 2016-05-17: qty 1

## 2016-05-17 MED ORDER — OXYCODONE-ACETAMINOPHEN 5-325 MG PO TABS: 1.0000 | ORAL_TABLET | Freq: Three times a day (TID) | ORAL | 0 refills | Status: DC | PRN

## 2016-05-17 MED ORDER — ORPHENADRINE CITRATE 30 MG/ML IJ SOLN
60.0000 mg | INTRAMUSCULAR | Status: AC
  Administered 2016-05-17: 60 mg via INTRAMUSCULAR
  Filled 2016-05-17: qty 2

## 2016-05-17 MED ORDER — CYCLOBENZAPRINE HCL 5 MG PO TABS: 5.0000 mg | ORAL_TABLET | Freq: Three times a day (TID) | ORAL | 0 refills | Status: DC | PRN

## 2016-05-17 MED ORDER — ONDANSETRON 4 MG PO TBDP
4.0000 mg | ORAL_TABLET | Freq: Once | ORAL | Status: AC
  Administered 2016-05-17: 4 mg via ORAL
  Filled 2016-05-17 (×2): qty 1

## 2016-05-17 MED ORDER — KETOROLAC TROMETHAMINE 60 MG/2ML IM SOLN
30.0000 mg | Freq: Once | INTRAMUSCULAR | Status: AC
  Administered 2016-05-17: 30 mg via INTRAMUSCULAR
  Filled 2016-05-17: qty 2

## 2016-05-17 NOTE — ED Notes (Signed)
Pt discharged home after verbalizing understanding of discharge instructions; nad noted. 
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
## 2016-08-20 DIAGNOSIS — R4589 Other symptoms and signs involving emotional state: Secondary | ICD-10-CM | POA: Insufficient documentation

## 2016-09-03 DIAGNOSIS — M722 Plantar fascial fibromatosis: Secondary | ICD-10-CM | POA: Insufficient documentation

## 2016-09-03 DIAGNOSIS — G2581 Restless legs syndrome: Secondary | ICD-10-CM | POA: Insufficient documentation

## 2016-09-03 DIAGNOSIS — G629 Polyneuropathy, unspecified: Secondary | ICD-10-CM | POA: Insufficient documentation

## 2016-10-02 DIAGNOSIS — M25511 Pain in right shoulder: Secondary | ICD-10-CM | POA: Insufficient documentation

## 2016-10-02 DIAGNOSIS — R519 Headache, unspecified: Secondary | ICD-10-CM | POA: Insufficient documentation

## 2016-10-02 DIAGNOSIS — G8929 Other chronic pain: Secondary | ICD-10-CM | POA: Insufficient documentation

## 2016-11-27 ENCOUNTER — Other Ambulatory Visit: Payer: Self-pay

## 2016-11-27 ENCOUNTER — Telehealth: Payer: Self-pay | Admitting: Internal Medicine

## 2016-11-27 MED ORDER — LINACLOTIDE 290 MCG PO CAPS: 290.0000 ug | ORAL_CAPSULE | Freq: Every day | ORAL | 5 refills | Status: DC

## 2016-11-27 NOTE — Telephone Encounter (Signed)
No prior auth needed. Request for refills only. RX given for 30 days with 5 refills.

## 2016-12-17 ENCOUNTER — Encounter: Payer: Self-pay | Admitting: *Deleted

## 2017-01-07 ENCOUNTER — Ambulatory Visit (INDEPENDENT_AMBULATORY_CARE_PROVIDER_SITE_OTHER): Payer: Medicare Other | Admitting: Internal Medicine

## 2017-01-07 ENCOUNTER — Encounter: Payer: Self-pay | Admitting: Internal Medicine

## 2017-01-07 ENCOUNTER — Other Ambulatory Visit (INDEPENDENT_AMBULATORY_CARE_PROVIDER_SITE_OTHER): Payer: Medicare Other

## 2017-01-07 VITALS — BP 112/68 | HR 66 | Ht 60.0 in | Wt 205.0 lb

## 2017-01-07 DIAGNOSIS — R140 Abdominal distension (gaseous): Secondary | ICD-10-CM | POA: Diagnosis not present

## 2017-01-07 DIAGNOSIS — K760 Fatty (change of) liver, not elsewhere classified: Secondary | ICD-10-CM | POA: Diagnosis not present

## 2017-01-07 DIAGNOSIS — K219 Gastro-esophageal reflux disease without esophagitis: Secondary | ICD-10-CM

## 2017-01-07 DIAGNOSIS — K5909 Other constipation: Secondary | ICD-10-CM

## 2017-01-07 LAB — CBC WITH DIFFERENTIAL/PLATELET
BASOS PCT: 0.8 % (ref 0.0–3.0)
Basophils Absolute: 0.1 10*3/uL (ref 0.0–0.1)
EOS ABS: 0.3 10*3/uL (ref 0.0–0.7)
EOS PCT: 4.2 % (ref 0.0–5.0)
HCT: 37.4 % (ref 36.0–46.0)
HEMOGLOBIN: 12.8 g/dL (ref 12.0–15.0)
LYMPHS PCT: 15 % (ref 12.0–46.0)
Lymphs Abs: 1.1 10*3/uL (ref 0.7–4.0)
MCHC: 34.3 g/dL (ref 30.0–36.0)
MCV: 86.7 fl (ref 78.0–100.0)
MONOS PCT: 5.5 % (ref 3.0–12.0)
Monocytes Absolute: 0.4 10*3/uL (ref 0.1–1.0)
Neutro Abs: 5.7 10*3/uL (ref 1.4–7.7)
Neutrophils Relative %: 74.5 % (ref 43.0–77.0)
Platelets: 211 10*3/uL (ref 150.0–400.0)
RBC: 4.31 Mil/uL (ref 3.87–5.11)
RDW: 14.4 % (ref 11.5–15.5)
WBC: 7.6 10*3/uL (ref 4.0–10.5)

## 2017-01-07 LAB — PROTIME-INR
INR: 1.1 ratio — AB (ref 0.8–1.0)
PROTHROMBIN TIME: 12.1 s (ref 9.6–13.1)

## 2017-01-07 LAB — FERRITIN: Ferritin: 13.4 ng/mL (ref 10.0–291.0)

## 2017-01-07 LAB — TSH: TSH: 0.84 u[IU]/mL (ref 0.35–4.50)

## 2017-01-07 MED ORDER — RANITIDINE HCL 150 MG PO TABS: 150.0000 mg | ORAL_TABLET | Freq: Every evening | ORAL | 2 refills | Status: DC

## 2017-01-07 NOTE — Progress Notes (Signed)
Subjective:    Patient ID: [name], female    DOB: 1951/02/01, 66 y.o.   MRN: [id number]  HPI [name] is a 66 year old female with a history of GERD, chronic constipation, colon polyps who is seen in follow-up to discuss fatty liver seen recently by ultrasound.  She is here alone today.  I saw her in February 2018 to discuss her chronic constipation and GERD.  At that time Linzess was increased to 290 mcg daily and ranitidine was added to once daily pantoprazole.  From a GI perspective she states her constipation is considerably better.  Linzess is a "miracle" drug for her.  She uses this daily and has a bowel movement daily.  She is not skipping days without bowel movement.  She has had no abdominal pain.  She has noticed 1 or 2 days/week feeling excessive abdominal bloating and belching.  Can be triggered by eating but can happen on an empty stomach.  Feels that she can literally see her stomach expanding.  No nausea or vomiting.  No blood in her stool or melena.  No lower extremity swelling.  He does not drink carbonated beverages.  Used to drink beer maybe 1-2 beers every couple of days but has stopped this since being told she had fatty liver.  From a liver perspective she denies a history of viral liver disease.  No jaundice, itching.  No history of ascites or significant lower extremity edema.  No bleeding that she is aware of or easy bruising.  No family history of liver disease.  She does state that her fianc is an alcoholic and she continues to work with him to try to reduce his alcohol intake.  She takes Lipitor daily, Synthroid, lisinopril, metoprolol, magnesium, venlafaxine in addition to the previously mentioned Linzess, pantoprazole, and ranitidine.  She also uses Symbicort and albuterol inhalers.  Her liver ultrasound was reviewed and performed on 10/16/2016.  This showed increased hepatic echogenicity consistent with fatty infiltration.  There is no focal hepatic  abnormality.  The portal vein was patent.  IVC was normal.  Spleen was normal in size.  Gallbladder was normal without stones there was mild stable prominence of the cystic duct.  This was compared to a CT from 06/19/2015  Review of Systems As per HPI, otherwise negative  Current Medications, Allergies, Past Medical History, Past Surgical History, Family History and Social History were reviewed in [employer] record.     Objective:   Physical Exam BP 112/68   Pulse 66   Ht 5' (1.524 m)   Wt 205 lb (93 kg)   BMI 40.04 kg/m  Constitutional: Well-developed and well-nourished. No distress. HEENT: Normocephalic and atraumatic. Oropharynx is clear and moist. Conjunctivae are normal.  No scleral icterus. Neck: Neck supple. Trachea midline. Cardiovascular: Normal rate, regular rhythm and intact distal pulses. No M/R/G Pulmonary/chest: Effort normal and breath sounds normal. No wheezing, rales or rhonchi. Abdominal: Soft, obese, nontender, nondistended. Bowel sounds active throughout. There are no masses palpable. No hepatosplenomegaly. Extremities: no clubbing, cyanosis, or edema Neurological: Alert and oriented to person place and time. Skin: Skin is warm and dry. Psychiatric: Normal mood and affect. Behavior is normal.  Labs pending today, see below    Assessment & Plan:   66 year old female with a history of GERD, chronic constipation, colon polyps who is seen in follow-up to discuss fatty liver seen recently by ultrasound.  1. Fatty liver --I do not have recent liver enzymes to determine if  she has inflammation associated with her fatty liver.  We spent time today discussing fatty liver disease and how it can in some cases lead to liver scarring and even cirrhosis.  She has already stopped alcohol which I think is very wise in her case.  She was never a heavy drinker.  I recommended that we check additional lab work today to rule out liver inflammation and other  causes of such.  There is no objective evidence of cirrhosis or advanced liver disease.  Her spleen was normal in size on the ultrasound.  The liver did not have a nodular contour.  Platelet count has been normal. --CBC, CMP, TSH, ferritin, INR, ANA, IgG, all viral hepatitis serologies, fibrosis liver panel --We discussed risk factor modification which for her includes diet, weight loss, exercise and controlling her lipids.  Lipitor has worked well for her to this point.  She does not have hypertension or diabetes  2.  Abdominal bloating --could be an irritable bowel type response.  We discussed dietary factors which can lead to bloating.  I am going to check her for SIBO by breath testing.  3.  Chronic constipation --Linzess is working well.  Continue Linzess to 90 mcg daily  4.  GERD --doing well since the addition of ranitidine.  Continue pantoprazole 40 mg each morning and ranitidine 150 mg each evening.  29-month follow-up, sooner as needed 25 minutes spent with the patient today. Greater than 50% was spent in counseling and coordination of care with the patient

## 2017-01-07 NOTE — Patient Instructions (Addendum)
We have sent the following medications to your pharmacy for you to pick up at your convenience: Zantac  Please continue Linzess, zantac and pantoprazole  Your physician has requested that you go to the basement for lab work before leaving today.  You have been referred for a small intestine bacterial overgrowth test (SIBO) which is completed by a company named [facility]. Your demographic and insurance information has been sent to this company and they should be in contact with you over the next week regarding this test. Please keep in mind that you will be getting this call from phone number [phone] or a similar number. If you do not hear from them within this time frame, please call our office at [phone].   Please follow up with Dr [name] in 6 months.  If you are age 63 or older, your body mass index should be between 23-30. Your Body mass index is 40.04 kg/m. If this is out of the aforementioned range listed, please consider follow up with your Primary Care Provider.  If you are age 22 or younger, your body mass index should be between 19-25. Your Body mass index is 40.04 kg/m. If this is out of the aformentioned range listed, please consider follow up with your Primary Care Provider.

## 2017-01-12 LAB — LIVER FIBROSIS, FIBROTEST-ACTITEST
ALT: 20 U/L (ref 6–29)
Alpha-2-Macroglobulin: 141 mg/dL (ref 106–279)
Apolipoprotein A1: 123 mg/dL (ref 101–198)
BILIRUBIN: 0.3 mg/dL (ref 0.2–1.2)
Fibrosis Score: 0.12
GGT: 25 U/L (ref 3–65)
Haptoglobin: 140 mg/dL (ref 43–212)
NECROINFLAMMAT ACT SCORE: 0.06
REFERENCE ID: 2217739

## 2017-01-12 LAB — HEPATITIS C ANTIBODY
HEP C AB: NONREACTIVE
SIGNAL TO CUT-OFF: 0.01 (ref <1.00)

## 2017-01-12 LAB — ANA: ANA: NEGATIVE

## 2017-01-12 LAB — HEPATITIS A ANTIBODY, TOTAL: Hepatitis A AB,Total: REACTIVE — AB

## 2017-01-12 LAB — HEPATITIS B SURFACE ANTIBODY,QUALITATIVE: Hep B S Ab: REACTIVE — AB

## 2017-01-12 LAB — HEPATITIS B CORE ANTIBODY, TOTAL: Hep B Core Total Ab: NONREACTIVE

## 2017-01-12 LAB — HEPATITIS B SURFACE ANTIGEN: Hepatitis B Surface Ag: NONREACTIVE

## 2017-01-12 LAB — IGG: IGG (IMMUNOGLOBIN G), SERUM: 874 mg/dL (ref 694–1618)

## 2017-01-14 ENCOUNTER — Other Ambulatory Visit: Payer: Self-pay

## 2017-01-14 DIAGNOSIS — R7989 Other specified abnormal findings of blood chemistry: Secondary | ICD-10-CM

## 2017-01-14 DIAGNOSIS — R945 Abnormal results of liver function studies: Principal | ICD-10-CM

## 2017-01-17 ENCOUNTER — Other Ambulatory Visit (INDEPENDENT_AMBULATORY_CARE_PROVIDER_SITE_OTHER): Payer: Medicare Other

## 2017-01-17 DIAGNOSIS — R945 Abnormal results of liver function studies: Secondary | ICD-10-CM

## 2017-01-17 DIAGNOSIS — R7989 Other specified abnormal findings of blood chemistry: Secondary | ICD-10-CM

## 2017-01-17 LAB — HEPATIC FUNCTION PANEL
ALK PHOS: 103 U/L (ref 39–117)
ALT: 33 U/L (ref 0–35)
AST: 21 U/L (ref 0–37)
Albumin: 3.9 g/dL (ref 3.5–5.2)
BILIRUBIN TOTAL: 0.7 mg/dL (ref 0.2–1.2)
Bilirubin, Direct: 0.2 mg/dL (ref 0.0–0.3)
Total Protein: 6.8 g/dL (ref 6.0–8.3)
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
## 2017-02-04 ENCOUNTER — Telehealth: Payer: Self-pay | Admitting: *Deleted

## 2017-02-04 NOTE — Telephone Encounter (Signed)
I have contacted [facility] regarding patient's SIBO test. They sent patient's test out on 01/08/17 and attempted to contact patient on 01/22/17 with no response. Voicemail was left for patient but no callback was received. I have contacted patient who states that she has "just been so busy" and she was actually thinking of doing this today. States she will do this test in the near future.

## 2017-05-14 ENCOUNTER — Other Ambulatory Visit: Payer: Self-pay | Admitting: *Deleted

## 2017-05-14 MED ORDER — PANTOPRAZOLE SODIUM 40 MG PO TBEC: 40.0000 mg | DELAYED_RELEASE_TABLET | Freq: Every day | ORAL | 2 refills | Status: DC

## 2017-06-13 ENCOUNTER — Telehealth: Payer: Self-pay | Admitting: Internal Medicine

## 2017-06-13 MED ORDER — LINACLOTIDE 290 MCG PO CAPS: 290.0000 ug | ORAL_CAPSULE | Freq: Every day | ORAL | 0 refills | Status: DC

## 2017-06-13 NOTE — Telephone Encounter (Signed)
Rx sent. Patient needs office visit for further refills. 

## 2017-06-19 DIAGNOSIS — M533 Sacrococcygeal disorders, not elsewhere classified: Secondary | ICD-10-CM | POA: Insufficient documentation

## 2017-07-18 ENCOUNTER — Telehealth: Payer: Self-pay | Admitting: Internal Medicine

## 2017-07-18 MED ORDER — LINACLOTIDE 290 MCG PO CAPS: 290.0000 ug | ORAL_CAPSULE | Freq: Every day | ORAL | 0 refills | Status: DC

## 2017-07-18 NOTE — Telephone Encounter (Signed)
Patient has scheduled a follow up appointment with Dr [name] on 09/30/17 (next available). I have sent enough Linzess to get her through until that time.

## 2017-07-26 DIAGNOSIS — J410 Simple chronic bronchitis: Secondary | ICD-10-CM | POA: Insufficient documentation

## 2017-08-16 ENCOUNTER — Other Ambulatory Visit: Payer: Self-pay | Admitting: *Deleted

## 2017-08-16 MED ORDER — PANTOPRAZOLE SODIUM 40 MG PO TBEC: DELAYED_RELEASE_TABLET | ORAL | 1 refills | Status: DC

## 2017-09-12 DIAGNOSIS — M25511 Pain in right shoulder: Secondary | ICD-10-CM | POA: Insufficient documentation

## 2017-09-30 ENCOUNTER — Ambulatory Visit (INDEPENDENT_AMBULATORY_CARE_PROVIDER_SITE_OTHER): Payer: Medicare Other | Admitting: Internal Medicine

## 2017-09-30 ENCOUNTER — Encounter: Payer: Self-pay | Admitting: Internal Medicine

## 2017-09-30 VITALS — BP 112/68 | HR 62 | Ht 61.0 in | Wt 223.4 lb

## 2017-09-30 DIAGNOSIS — K219 Gastro-esophageal reflux disease without esophagitis: Secondary | ICD-10-CM

## 2017-09-30 DIAGNOSIS — K760 Fatty (change of) liver, not elsewhere classified: Secondary | ICD-10-CM | POA: Diagnosis not present

## 2017-09-30 DIAGNOSIS — K581 Irritable bowel syndrome with constipation: Secondary | ICD-10-CM

## 2017-09-30 MED ORDER — LUBIPROSTONE 24 MCG PO CAPS: 24.0000 ug | ORAL_CAPSULE | Freq: Two times a day (BID) | ORAL | 0 refills | Status: DC

## 2017-09-30 MED ORDER — RANITIDINE HCL 150 MG PO TABS: 150.0000 mg | ORAL_TABLET | Freq: Every evening | ORAL | 1 refills | Status: DC

## 2017-09-30 MED ORDER — PANTOPRAZOLE SODIUM 40 MG PO TBEC: DELAYED_RELEASE_TABLET | ORAL | 1 refills | Status: DC

## 2017-09-30 MED ORDER — VITAMIN E 180 MG (400 UNIT) PO CAPS: 800.0000 [IU] | ORAL_CAPSULE | Freq: Every day | ORAL | 0 refills | Status: DC

## 2017-09-30 NOTE — Patient Instructions (Signed)
Discontinue Linzess.  We have sent the following medications to your pharmacy for you to pick up at your convenience: Amitiza 24 mcg twice daily with food Zantac  Pantoprazole  Vitamin E 800 IU  Please follow up with Dr [name] in 6 months.  If you are age 67 or older, your body mass index should be between 23-30. Your Body mass index is 42.21 kg/m. If this is out of the aforementioned range listed, please consider follow up with your Primary Care Provider.  If you are age 17 or younger, your body mass index should be between 19-25. Your Body mass index is 42.21 kg/m. If this is out of the aformentioned range listed, please consider follow up with your Primary Care Provider.

## 2017-09-30 NOTE — Progress Notes (Signed)
Subjective:    Patient ID: [name], female    DOB: 1950/11/09, 67 y.o.   MRN: [id number]  HPI [name] is a 67 year old female with history of GERD, chronic constipation with irritable bowel, colon polyps, fatty liver who is here for follow-up.  Last seen in November 2018.  She is here today with her daughter.  She reports that from a GI perspective she has been doing fairly well.  She does not feel that Linzess is as effective as it was for her initially.  Initially this felt to be like a "miracle" drug for her.  Now she has had less complete bowel movements then the first few months of using this therapy.  She still has a bowel movement most days but they can be incomplete.  She can miss days without having a bowel movement and on these days she feels abdominal bloating and somewhat obstipated.  No blood in her stool or melena.  No new abdominal pain.  No jaundice, itching.  No new upper GI complaint.  She is using pantoprazole 40 mg in the morning and Zantac 150 mg in the evening.  This is controlling her reflux well.  No dysphagia or odynophagia.  she has totally avoided all alcohol.  Prior liver ultrasound showed fatty infiltration of the liver.  Gallbladder was normal without stones.  She had a fibro-test which showed no evidence of liver fibrosis, F0  Review of Systems As per HPI, otherwise negative  Current Medications, Allergies, Past Medical History, Past Surgical History, Family History and Social History were reviewed in [employer] record.     Objective:   Physical Exam BP 112/68   Pulse 62   Ht 5\' 1"  (1.549 m)   Wt 223 lb 6 oz (101.3 kg)   BMI 42.21 kg/m  Constitutional: Well-developed and well-nourished. No distress. HEENT: Normocephalic and atraumatic.  Conjunctivae are normal.  No scleral icterus. Neck: Neck supple. Cardiovascular: Normal rate, regular rhythm and intact distal pulses.  Pulmonary/chest: Effort normal and breath  sounds normal. No wheezing, rales or rhonchi. Abdominal: Soft, nontender, nondistended. Bowel sounds active throughout.  Extremities: no clubbing, cyanosis, or edema Neurological: Alert and oriented to person place and time. Skin: Skin is warm and dry. Psychiatric: Normal mood and affect. Behavior is normal.  CBC    Component Value Date/Time   WBC 7.6 01/07/2017 1043   RBC 4.31 01/07/2017 1043   HGB 12.8 01/07/2017 1043   HGB 14.2 06/10/2013 0844   HCT 37.4 01/07/2017 1043   HCT 41.2 06/10/2013 0844   PLT 211.0 01/07/2017 1043   PLT 152 06/10/2013 0844   MCV 86.7 01/07/2017 1043   MCV 85 06/10/2013 0844   MCH 30.6 06/23/2015 0308   MCHC 34.3 01/07/2017 1043   RDW 14.4 01/07/2017 1043   RDW 13.9 06/10/2013 0844   LYMPHSABS 1.1 01/07/2017 1043   MONOABS 0.4 01/07/2017 1043   EOSABS 0.3 01/07/2017 1043   BASOSABS 0.1 01/07/2017 1043   CMP     Component Value Date/Time   NA 142 06/23/2015 0308   NA 143 06/10/2013 0844   K 4.0 06/23/2015 0308   K 3.5 06/10/2013 0844   CL 102 06/23/2015 0308   CL 103 06/10/2013 0844   CO2 30 06/23/2015 0308   CO2 33 (H) 06/10/2013 0844   GLUCOSE 160 (H) 06/23/2015 0308   GLUCOSE 104 (H) 06/10/2013 0844   BUN 14 06/23/2015 0308   BUN 9 06/10/2013 0844   CREATININE 0.66  06/23/2015 0308   CREATININE 0.70 06/10/2013 0844   CALCIUM 8.7 (L) 06/23/2015 0308   CALCIUM 8.7 06/10/2013 0844   PROT 6.8 01/17/2017 0838   PROT 7.5 06/10/2013 0844   ALBUMIN 3.9 01/17/2017 0838   ALBUMIN 3.8 06/10/2013 0844   AST 21 01/17/2017 0838   AST 15 06/10/2013 0844   ALT 33 01/17/2017 0838   ALT 20 01/07/2017 1043   ALKPHOS 103 01/17/2017 0838   ALKPHOS 86 06/10/2013 0844   BILITOT 0.7 01/17/2017 0838   BILITOT 0.4 06/10/2013 0844   GFRNONAA >60 06/23/2015 0308   GFRNONAA >60 06/10/2013 0844   GFRAA >60 06/23/2015 0308   GFRAA >60 06/10/2013 0844   FibroTest -- F0, Necroinflammatory Grade A0  Hep C negative, hep B immune ANA negative, IgG  normal Iron studies normal    Assessment & Plan:  67 year old female with history of GERD, chronic constipation with irritable bowel, colon polyps, fatty liver who is here for follow-up.   1.  Fatty liver --we discussed fatty liver disease today.  Risk factors for her include obesity and hypertension.  She does not have diabetes.  I recommended we put her on vitamin E 800 IU daily.  Fortunately she does not have evidence of transaminitis or liver inflammation and this very likely means a very low risk of overall progression to cirrhosis.  Her fibro-test had F0 fibrosis which is also very encouraging.  I still recommend that she continue to modify risk factors, exercise and attempt to lose weight when possible.  2.  Chronic constipation/IBS --Linzess has lost some efficacy for her.  Will try Amitiza 24 mcg twice daily with food.  I asked that she notify me if this does not help and improve her overall constipation.  Other options would include Trulance or Motegrity.  3.  GERD --well-controlled with pantoprazole 40 mg in the morning and Zantac 150 mg in the evening.  Continue this regimen  4.  History of colon polyps --surveillance colonoscopy recommended in March 2020  38-month follow-up, sooner if needed 25 minutes spent with the patient today. Greater than 50% was spent in counseling and coordination of care with the patient

## 2017-12-21 ENCOUNTER — Other Ambulatory Visit: Payer: Self-pay | Admitting: Internal Medicine

## 2017-12-23 ENCOUNTER — Telehealth: Payer: Self-pay | Admitting: Internal Medicine

## 2017-12-23 MED ORDER — LINACLOTIDE 290 MCG PO CAPS: 290.0000 ug | ORAL_CAPSULE | Freq: Every day | ORAL | 0 refills | Status: DC

## 2017-12-23 NOTE — Telephone Encounter (Signed)
Yes, ok to switch back to Linzess

## 2017-12-23 NOTE — Telephone Encounter (Signed)
Rx for Linzess sent, Amitiza discontinued. Patient advised.

## 2017-12-23 NOTE — Telephone Encounter (Signed)
Patient states that the Amitiza that we switched her to was not working well for her. She decided to switch back to the Linzess that she originally had that she previously thought had lost efficacy. She states it is "now working perfectly" for her. Are you okay with me d/c'ing Amitiza script and restarting Linzess script again?

## 2018-03-08 ENCOUNTER — Other Ambulatory Visit: Payer: Self-pay | Admitting: Internal Medicine

## 2018-03-18 ENCOUNTER — Ambulatory Visit: Payer: Medicare Other | Admitting: Physician Assistant

## 2018-03-19 ENCOUNTER — Encounter: Payer: Self-pay | Admitting: Physician Assistant

## 2018-03-19 ENCOUNTER — Ambulatory Visit (INDEPENDENT_AMBULATORY_CARE_PROVIDER_SITE_OTHER): Payer: Medicare Other | Admitting: Physician Assistant

## 2018-03-19 VITALS — BP 154/76 | HR 83 | Ht 59.5 in | Wt 219.1 lb

## 2018-03-19 DIAGNOSIS — K5909 Other constipation: Secondary | ICD-10-CM

## 2018-03-19 DIAGNOSIS — Z1211 Encounter for screening for malignant neoplasm of colon: Secondary | ICD-10-CM | POA: Diagnosis not present

## 2018-03-19 DIAGNOSIS — K760 Fatty (change of) liver, not elsewhere classified: Secondary | ICD-10-CM | POA: Diagnosis not present

## 2018-03-19 DIAGNOSIS — K219 Gastro-esophageal reflux disease without esophagitis: Secondary | ICD-10-CM

## 2018-03-19 DIAGNOSIS — Z86010 Personal history of colonic polyps: Secondary | ICD-10-CM

## 2018-03-19 MED ORDER — LINACLOTIDE 290 MCG PO CAPS: ORAL_CAPSULE | ORAL | 3 refills | Status: DC

## 2018-03-19 MED ORDER — NA SULFATE-K SULFATE-MG SULF 17.5-3.13-1.6 GM/177ML PO SOLN: ORAL | 0 refills | Status: DC

## 2018-03-19 MED ORDER — PANTOPRAZOLE SODIUM 40 MG PO TBEC: DELAYED_RELEASE_TABLET | ORAL | 3 refills | Status: DC

## 2018-03-19 NOTE — Patient Instructions (Addendum)
We sent prescriptions to [facility], [place], [place].  1. Linzess 290 mcg 2. Pantoprazole sodium ( Protonix) 40 mg 3. Suprep for colonoscopy   Take Vitamin E 800 IU daily. (Over the counter. )  You have been scheduled for a colonoscopy. Please follow written instructions given to you at your visit today.  Please pick up your prep supplies at the pharmacy within the next 1-3 days. If you use inhalers (even only as needed), please bring them with you on the day of your procedure. Your physician has requested that you go to www.startemmi.com and enter the access code given to you at your visit today. This web site gives a general overview about your procedure. However, you should still follow specific instructions given to you by our office regarding your preparation for the procedure.  Normal BMI (Body Mass Index- based on height and weight) is between 23 and 30. Your BMI today is Body mass index is 43.51 kg/m. Marland Kitchen Please consider follow up  regarding your BMI with your Primary Care Provider.
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
## 2018-04-17 ENCOUNTER — Ambulatory Visit (AMBULATORY_SURGERY_CENTER): Payer: Medicare Other | Admitting: Internal Medicine

## 2018-04-17 ENCOUNTER — Encounter: Payer: Self-pay | Admitting: Internal Medicine

## 2018-04-17 VITALS — BP 117/45 | HR 57 | Temp 96.0°F | Resp 12 | Ht 59.0 in | Wt 219.0 lb

## 2018-04-17 DIAGNOSIS — Z86010 Personal history of colonic polyps: Secondary | ICD-10-CM

## 2018-04-17 DIAGNOSIS — D123 Benign neoplasm of transverse colon: Secondary | ICD-10-CM

## 2018-04-17 DIAGNOSIS — Z1211 Encounter for screening for malignant neoplasm of colon: Secondary | ICD-10-CM

## 2018-04-17 DIAGNOSIS — D127 Benign neoplasm of rectosigmoid junction: Secondary | ICD-10-CM

## 2018-04-17 DIAGNOSIS — D122 Benign neoplasm of ascending colon: Secondary | ICD-10-CM

## 2018-04-17 MED ORDER — SODIUM CHLORIDE 0.9 % IV SOLN: 500.0000 mL | Freq: Once | INTRAVENOUS | Status: DC

## 2018-04-17 NOTE — Progress Notes (Signed)
PT taken to PACU. Monitors in place. VSS. Report given to RN. 

## 2018-04-17 NOTE — Patient Instructions (Signed)
YOU HAD AN ENDOSCOPIC PROCEDURE TODAY AT THE [facility]:   Refer to the procedure report that was given to you for any specific questions about what was found during the examination.  If the procedure report does not answer your questions, please call your gastroenterologist to clarify.  If you requested that your care partner not be given the details of your procedure findings, then the procedure report has been included in a sealed envelope for you to review at your convenience later.  YOU SHOULD EXPECT: Some feelings of bloating in the abdomen. Passage of more gas than usual.  Walking can help get rid of the air that was put into your GI tract during the procedure and reduce the bloating. If you had a lower endoscopy (such as a colonoscopy or flexible sigmoidoscopy) you may notice spotting of blood in your stool or on the toilet paper. If you underwent a bowel prep for your procedure, you may not have a normal bowel movement for a few days.  Please Note:  You might notice some irritation and congestion in your nose or some drainage.  This is from the oxygen used during your procedure.  There is no need for concern and it should clear up in a day or so.  SYMPTOMS TO REPORT IMMEDIATELY:   Following lower endoscopy (colonoscopy or flexible sigmoidoscopy):  Excessive amounts of blood in the stool  Significant tenderness or worsening of abdominal pains  Swelling of the abdomen that is new, acute  Fever of 100F or higher  For urgent or emergent issues, a gastroenterologist can be reached at any hour by calling [phone].   DIET:  We do recommend a small meal at first, but then you may proceed to your regular diet.  Drink plenty of fluids but you should avoid alcoholic beverages for 24 hours.  ACTIVITY:  You should plan to take it easy for the rest of today and you should NOT DRIVE or use heavy machinery until tomorrow (because of the sedation medicines used during the test).     FOLLOW UP: Our staff will call the number listed on your records the next business day following your procedure to check on you and address any questions or concerns that you may have regarding the information given to you following your procedure. If we do not reach you, we will leave a message.  However, if you are feeling well and you are not experiencing any problems, there is no need to return our call.  We will assume that you have returned to your regular daily activities without incident.  If any biopsies were taken you will be contacted by phone or by letter within the next 1-3 weeks.  Please call us at [phone] if you have not heard about the biopsies in 3 weeks.   Await for biopsy results Polyps (handout given) Diverticulosis (handout given)  SIGNATURES/CONFIDENTIALITY: You and/or your care partner have signed paperwork which will be entered into your electronic medical record.  These signatures attest to the fact that that the information above on your After Visit Summary has been reviewed and is understood.  Full responsibility of the confidentiality of this discharge information lies with you and/or your care-partner. 

## 2018-04-17 NOTE — Op Note (Signed)
[place] Patient Name: [name] Procedure Date: 04/17/2018 10:26 AM MRN: [id number] Endoscopist: Jerene Bears , MD Age: 68 Referring MD:  Date of Birth: [birth year] Gender: Female Account #: [id number] Procedure:                Colonoscopy Indications:              High risk colon cancer surveillance: Personal                            history of non-advanced adenoma, Personal history                            of sessile serrated colon polyp (less than 10 mm in                            size) with no dysplasia, Last colonoscopy 3 years                            ago Medicines:                Monitored Anesthesia Care Procedure:                Pre-Anesthesia Assessment:                           - Prior to the procedure, a History and Physical                            was performed, and patient medications and                            allergies were reviewed. The patient's tolerance of                            previous anesthesia was also reviewed. The risks                            and benefits of the procedure and the sedation                            options and risks were discussed with the patient.                            All questions were answered, and informed consent                            was obtained. Prior Anticoagulants: The patient has                            taken no previous anticoagulant or antiplatelet                            agents. ASA Grade Assessment: III - A patient with  severe systemic disease. After reviewing the risks                            and benefits, the patient was deemed in                            satisfactory condition to undergo the procedure.                           After obtaining informed consent, the colonoscope                            was passed under direct vision. Throughout the                            procedure, the patient's blood pressure, pulse, and                       oxygen saturations were monitored continuously. The                            Colonoscope was introduced through the anus and                            advanced to the cecum, identified by appendiceal                            orifice and ileocecal valve. The colonoscopy was                            performed without difficulty. The patient tolerated                            the procedure well. The quality of the bowel                            preparation was good. The ileocecal valve,                            appendiceal orifice, and rectum were photographed. Scope In: 10:56:58 AM Scope Out: 11:12:10 AM Scope Withdrawal Time: 0 hours 13 minutes 14 seconds  Total Procedure Duration: 0 hours 15 minutes 12 seconds  Findings:                 The digital rectal exam was normal.                           Two sessile polyps were found in the ascending                            colon. The polyps were 3 to 4 mm in size. These                            polyps were removed with a cold snare. Resection  and retrieval were complete.                           Three sessile polyps were found in the transverse                            colon. The polyps were 3 to 5 mm in size. These                            polyps were removed with a cold snare. Resection                            and retrieval were complete.                           A 3 mm polyp was found in the recto-sigmoid colon.                            The polyp was sessile. The polyp was removed with a                            cold snare. Resection and retrieval were complete.                           Multiple small and large-mouthed diverticula were                            found in the sigmoid colon and distal descending                            colon.                           The retroflexed view of the distal rectum and anal                            verge was normal  and showed no anal or rectal                            abnormalities. Complications:            No immediate complications. Estimated Blood Loss:     Estimated blood loss was minimal. Impression:               - Two 3 to 4 mm polyps in the ascending colon,                            removed with a cold snare. Resected and retrieved.                           - Three 3 to 5 mm polyps in the transverse colon,                            removed with a cold  snare. Resected and retrieved.                           - One 3 mm polyp at the recto-sigmoid colon,                            removed with a cold snare. Resected and retrieved.                           - Diverticulosis in the sigmoid colon and in the                            distal descending colon.                           - The distal rectum and anal verge are normal on                            retroflexion view. Recommendation:           - Patient has a contact number available for                            emergencies. The signs and symptoms of potential                            delayed complications were discussed with the                            patient. Return to normal activities tomorrow.                            Written discharge instructions were provided to the                            patient.                           - Resume previous diet.                           - Continue present medications.                           - Await pathology results.                           - Repeat colonoscopy is recommended for                            surveillance. The colonoscopy date will be                            determined after pathology results from today's  exam become available for review. Jerene Bears, MD 04/17/2018 11:15:51 AM This report has been signed electronically.

## 2018-04-17 NOTE — Progress Notes (Signed)
Called to room to assist during endoscopic procedure.  Patient ID and intended procedure confirmed with present staff. Received instructions for my participation in the procedure from the performing physician.  

## 2018-04-18 ENCOUNTER — Telehealth: Payer: Self-pay | Admitting: *Deleted

## 2018-04-18 NOTE — Telephone Encounter (Signed)
  Follow up Call-  Call back number 04/17/2018  Post procedure Call Back phone  # [phone]  Permission to leave phone message Yes  Some recent data might be hidden     Patient questions:  Do you have a fever, pain , or abdominal swelling? Yes.   Pain Score  3 *  States a sore throat.  States she feels like she's "getting sick." Going to see her PCP this AM.  Have you tolerated food without any problems? No.  Have you been able to return to your normal activities? No.  Do you have any questions about your discharge instructions: Diet   No. Medications  No. Follow up visit  No.  Do you have questions or concerns about your Care? No.  Actions: * If pain score is 4 or above: No action needed, pain <4.

## 2018-04-24 ENCOUNTER — Encounter: Payer: Self-pay | Admitting: Internal Medicine

## 2018-05-01 DIAGNOSIS — M5136 Other intervertebral disc degeneration, lumbar region: Secondary | ICD-10-CM | POA: Insufficient documentation

## 2018-09-03 DIAGNOSIS — M8580 Other specified disorders of bone density and structure, unspecified site: Secondary | ICD-10-CM | POA: Insufficient documentation

## 2018-10-16 DIAGNOSIS — Z9884 Bariatric surgery status: Secondary | ICD-10-CM | POA: Insufficient documentation

## 2018-10-16 HISTORY — PX: LAPAROSCOPIC GASTRIC SLEEVE RESECTION: SHX5895

## 2018-11-07 ENCOUNTER — Telehealth: Payer: Self-pay | Admitting: *Deleted

## 2018-11-07 NOTE — Telephone Encounter (Signed)
Dr [name] received lft's by fax from Dr [name] drawn on 10/22/2018. AST 96, ALT 120, Alk phos 100, total bili 0.7, direct bili 0.20. He has reviewed and states " LFT's are up. Office follow up with me/APP recommended for further evaluation. JMP"   I have looked in [facility] care-everywhere to see that patient has also had repeat LFT's completed on 10/29/18 showing AST of 32 and ALT of 120 with alk phos of 98 and total bili of 0.7, direct bili 0.10.  I have contacted patient to schedule an appointment with an APP as Dr [name] currently has no office availabilities. Unfortunately, she indicates that she would to call our office back at a later time to schedule as this is not currently a good time for her to do so. We will await a return call.

## 2018-11-26 DIAGNOSIS — M961 Postlaminectomy syndrome, not elsewhere classified: Secondary | ICD-10-CM | POA: Insufficient documentation

## 2018-12-02 DIAGNOSIS — I10 Essential (primary) hypertension: Secondary | ICD-10-CM | POA: Insufficient documentation

## 2018-12-02 DIAGNOSIS — E7800 Pure hypercholesterolemia, unspecified: Secondary | ICD-10-CM | POA: Insufficient documentation

## 2018-12-15 DIAGNOSIS — M25552 Pain in left hip: Secondary | ICD-10-CM | POA: Insufficient documentation

## 2019-01-17 ENCOUNTER — Encounter: Payer: Self-pay | Admitting: Intensive Care

## 2019-01-17 ENCOUNTER — Emergency Department: Payer: Medicare Other

## 2019-01-17 ENCOUNTER — Emergency Department
Admission: EM | Admit: 2019-01-17 | Discharge: 2019-01-17 | Disposition: A | Discharge status: Home / Self Care | Payer: Medicare Other | Attending: Emergency Medicine | Admitting: Emergency Medicine

## 2019-01-17 ENCOUNTER — Other Ambulatory Visit: Payer: Self-pay

## 2019-01-17 DIAGNOSIS — I10 Essential (primary) hypertension: Secondary | ICD-10-CM | POA: Insufficient documentation

## 2019-01-17 DIAGNOSIS — Z20828 Contact with and (suspected) exposure to other viral communicable diseases: Secondary | ICD-10-CM | POA: Diagnosis not present

## 2019-01-17 DIAGNOSIS — E039 Hypothyroidism, unspecified: Secondary | ICD-10-CM | POA: Diagnosis not present

## 2019-01-17 DIAGNOSIS — Z79899 Other long term (current) drug therapy: Secondary | ICD-10-CM | POA: Insufficient documentation

## 2019-01-17 DIAGNOSIS — R519 Headache, unspecified: Secondary | ICD-10-CM | POA: Diagnosis present

## 2019-01-17 DIAGNOSIS — Z91040 Latex allergy status: Secondary | ICD-10-CM | POA: Diagnosis not present

## 2019-01-17 DIAGNOSIS — J45909 Unspecified asthma, uncomplicated: Secondary | ICD-10-CM | POA: Diagnosis not present

## 2019-01-17 DIAGNOSIS — R05 Cough: Secondary | ICD-10-CM | POA: Insufficient documentation

## 2019-01-17 DIAGNOSIS — R0789 Other chest pain: Secondary | ICD-10-CM | POA: Diagnosis not present

## 2019-01-17 DIAGNOSIS — J449 Chronic obstructive pulmonary disease, unspecified: Secondary | ICD-10-CM | POA: Diagnosis not present

## 2019-01-17 DIAGNOSIS — Z20822 Contact with and (suspected) exposure to covid-19: Secondary | ICD-10-CM

## 2019-01-17 LAB — BASIC METABOLIC PANEL
Anion gap: 15 (ref 5–15)
BUN: 25 mg/dL — ABNORMAL HIGH (ref 8–23)
CO2: 29 mmol/L (ref 22–32)
Calcium: 9 mg/dL (ref 8.9–10.3)
Chloride: 96 mmol/L — ABNORMAL LOW (ref 98–111)
Creatinine, Ser: 1.1 mg/dL — ABNORMAL HIGH (ref 0.44–1.00)
GFR calc Af Amer: 60 mL/min — ABNORMAL LOW (ref >60)
GFR calc non Af Amer: 52 mL/min — ABNORMAL LOW (ref >60)
Glucose, Bld: 108 mg/dL — ABNORMAL HIGH (ref 70–99)
Potassium: 3 mmol/L — ABNORMAL LOW (ref 3.5–5.1)
Sodium: 140 mmol/L (ref 135–145)

## 2019-01-17 LAB — CBC
HCT: 41.6 % (ref 36.0–46.0)
Hemoglobin: 15.2 g/dL — ABNORMAL HIGH (ref 12.0–15.0)
MCH: 29.9 pg (ref 26.0–34.0)
MCHC: 36.5 g/dL — ABNORMAL HIGH (ref 30.0–36.0)
MCV: 81.9 fL (ref 80.0–100.0)
Platelets: 214 10*3/uL (ref 150–400)
RBC: 5.08 MIL/uL (ref 3.87–5.11)
RDW: 13.3 % (ref 11.5–15.5)
WBC: 9 10*3/uL (ref 4.0–10.5)
nRBC: 0 % (ref 0.0–0.2)

## 2019-01-17 LAB — TROPONIN I (HIGH SENSITIVITY): Troponin I (High Sensitivity): 7 ng/L (ref <18)

## 2019-01-17 LAB — SARS CORONAVIRUS 2 (TAT 6-24 HRS): SARS Coronavirus 2: NEGATIVE

## 2019-01-17 NOTE — ED Notes (Signed)
Pt verbalized understanding of discharge instructions. NAD at this time. 

## 2019-01-17 NOTE — ED Notes (Signed)
MD Kinner at bedside  
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
## 2019-01-17 NOTE — ED Triage Notes (Signed)
Patient has symptoms of headache, cp, and sob for a few days. Reports her brother who she has been around just tested positive for covid.

## 2019-01-29 ENCOUNTER — Emergency Department
Admission: EM | Admit: 2019-01-29 | Discharge: 2019-01-29 | Disposition: A | Discharge status: Home / Self Care | Payer: Medicare Other | Attending: Emergency Medicine | Admitting: Emergency Medicine

## 2019-01-29 ENCOUNTER — Other Ambulatory Visit: Payer: Self-pay

## 2019-01-29 ENCOUNTER — Emergency Department: Payer: Medicare Other

## 2019-01-29 DIAGNOSIS — Z91040 Latex allergy status: Secondary | ICD-10-CM | POA: Diagnosis not present

## 2019-01-29 DIAGNOSIS — Z79899 Other long term (current) drug therapy: Secondary | ICD-10-CM | POA: Diagnosis not present

## 2019-01-29 DIAGNOSIS — E039 Hypothyroidism, unspecified: Secondary | ICD-10-CM | POA: Insufficient documentation

## 2019-01-29 DIAGNOSIS — U071 COVID-19: Secondary | ICD-10-CM | POA: Insufficient documentation

## 2019-01-29 DIAGNOSIS — I10 Essential (primary) hypertension: Secondary | ICD-10-CM | POA: Diagnosis not present

## 2019-01-29 DIAGNOSIS — J449 Chronic obstructive pulmonary disease, unspecified: Secondary | ICD-10-CM | POA: Insufficient documentation

## 2019-01-29 DIAGNOSIS — R05 Cough: Secondary | ICD-10-CM | POA: Diagnosis present

## 2019-01-29 LAB — POC SARS CORONAVIRUS 2 AG: SARS Coronavirus 2 Ag: POSITIVE — AB

## 2019-01-29 MED ORDER — PREDNISONE 10 MG (21) PO TBPK: ORAL_TABLET | ORAL | 0 refills | Status: DC

## 2019-01-29 MED ORDER — AZITHROMYCIN 250 MG PO TABS: ORAL_TABLET | ORAL | 0 refills | Status: DC

## 2019-01-29 NOTE — ED Triage Notes (Signed)
Pt has had 2 known positive COVID contacts in same household as her.
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
## 2019-01-29 NOTE — Discharge Instructions (Addendum)
Follow-up with your regular doctor if not improving in 2 to 3 days.  Return emergency department for worsening.  Take medications as prescribed.  If you become short of breath or have chest pain please return to the emergency department.

## 2019-01-30 DIAGNOSIS — U071 COVID-19: Secondary | ICD-10-CM | POA: Diagnosis present

## 2019-02-10 ENCOUNTER — Other Ambulatory Visit: Payer: Self-pay

## 2019-02-10 ENCOUNTER — Emergency Department: Payer: Medicare Other

## 2019-02-10 ENCOUNTER — Encounter: Payer: Self-pay | Admitting: Radiology

## 2019-02-10 ENCOUNTER — Other Ambulatory Visit: Payer: Self-pay | Admitting: Physician Assistant

## 2019-02-10 ENCOUNTER — Observation Stay
Admission: EM | Admit: 2019-02-10 | Discharge: 2019-02-12 | Disposition: A | Discharge status: Home / Self Care | Payer: Medicare Other | Attending: Hospitalist | Admitting: Hospitalist

## 2019-02-10 DIAGNOSIS — E039 Hypothyroidism, unspecified: Secondary | ICD-10-CM | POA: Diagnosis not present

## 2019-02-10 DIAGNOSIS — J440 Chronic obstructive pulmonary disease with acute lower respiratory infection: Secondary | ICD-10-CM | POA: Insufficient documentation

## 2019-02-10 DIAGNOSIS — Z79890 Hormone replacement therapy (postmenopausal): Secondary | ICD-10-CM | POA: Diagnosis not present

## 2019-02-10 DIAGNOSIS — F329 Major depressive disorder, single episode, unspecified: Secondary | ICD-10-CM | POA: Insufficient documentation

## 2019-02-10 DIAGNOSIS — F419 Anxiety disorder, unspecified: Secondary | ICD-10-CM | POA: Diagnosis not present

## 2019-02-10 DIAGNOSIS — Z9884 Bariatric surgery status: Secondary | ICD-10-CM | POA: Diagnosis not present

## 2019-02-10 DIAGNOSIS — J1289 Other viral pneumonia: Secondary | ICD-10-CM | POA: Insufficient documentation

## 2019-02-10 DIAGNOSIS — M1990 Unspecified osteoarthritis, unspecified site: Secondary | ICD-10-CM | POA: Diagnosis not present

## 2019-02-10 DIAGNOSIS — Z7951 Long term (current) use of inhaled steroids: Secondary | ICD-10-CM | POA: Diagnosis not present

## 2019-02-10 DIAGNOSIS — E669 Obesity, unspecified: Secondary | ICD-10-CM | POA: Diagnosis present

## 2019-02-10 DIAGNOSIS — G4730 Sleep apnea, unspecified: Secondary | ICD-10-CM | POA: Diagnosis not present

## 2019-02-10 DIAGNOSIS — Z86010 Personal history of colonic polyps: Secondary | ICD-10-CM | POA: Diagnosis not present

## 2019-02-10 DIAGNOSIS — Z885 Allergy status to narcotic agent status: Secondary | ICD-10-CM | POA: Diagnosis not present

## 2019-02-10 DIAGNOSIS — Z79899 Other long term (current) drug therapy: Secondary | ICD-10-CM | POA: Insufficient documentation

## 2019-02-10 DIAGNOSIS — I10 Essential (primary) hypertension: Secondary | ICD-10-CM | POA: Insufficient documentation

## 2019-02-10 DIAGNOSIS — U071 COVID-19: Secondary | ICD-10-CM | POA: Diagnosis not present

## 2019-02-10 DIAGNOSIS — Z6833 Body mass index (BMI) 33.0-33.9, adult: Secondary | ICD-10-CM | POA: Insufficient documentation

## 2019-02-10 DIAGNOSIS — F32A Depression, unspecified: Secondary | ICD-10-CM | POA: Diagnosis present

## 2019-02-10 DIAGNOSIS — J45909 Unspecified asthma, uncomplicated: Secondary | ICD-10-CM | POA: Diagnosis present

## 2019-02-10 DIAGNOSIS — K219 Gastro-esophageal reflux disease without esophagitis: Secondary | ICD-10-CM | POA: Insufficient documentation

## 2019-02-10 DIAGNOSIS — M797 Fibromyalgia: Secondary | ICD-10-CM | POA: Diagnosis not present

## 2019-02-10 DIAGNOSIS — J9601 Acute respiratory failure with hypoxia: Secondary | ICD-10-CM | POA: Diagnosis not present

## 2019-02-10 DIAGNOSIS — K8020 Calculus of gallbladder without cholecystitis without obstruction: Secondary | ICD-10-CM | POA: Diagnosis not present

## 2019-02-10 DIAGNOSIS — R0602 Shortness of breath: Secondary | ICD-10-CM | POA: Diagnosis present

## 2019-02-10 DIAGNOSIS — K589 Irritable bowel syndrome without diarrhea: Secondary | ICD-10-CM | POA: Insufficient documentation

## 2019-02-10 DIAGNOSIS — K760 Fatty (change of) liver, not elsewhere classified: Secondary | ICD-10-CM | POA: Insufficient documentation

## 2019-02-10 DIAGNOSIS — Z9889 Other specified postprocedural states: Secondary | ICD-10-CM

## 2019-02-10 DIAGNOSIS — E785 Hyperlipidemia, unspecified: Secondary | ICD-10-CM | POA: Insufficient documentation

## 2019-02-10 DIAGNOSIS — E876 Hypokalemia: Secondary | ICD-10-CM

## 2019-02-10 LAB — C-REACTIVE PROTEIN: CRP: 5.4 mg/dL — ABNORMAL HIGH (ref <1.0)

## 2019-02-10 LAB — CBC
HCT: 36.8 % (ref 36.0–46.0)
Hemoglobin: 12.6 g/dL (ref 12.0–15.0)
MCH: 29.9 pg (ref 26.0–34.0)
MCHC: 34.2 g/dL (ref 30.0–36.0)
MCV: 87.4 fL (ref 80.0–100.0)
Platelets: 153 10*3/uL (ref 150–400)
RBC: 4.21 MIL/uL (ref 3.87–5.11)
RDW: 13.9 % (ref 11.5–15.5)
WBC: 5.8 10*3/uL (ref 4.0–10.5)
nRBC: 0 % (ref 0.0–0.2)

## 2019-02-10 LAB — COMPREHENSIVE METABOLIC PANEL
ALT: 30 U/L (ref 0–44)
AST: 25 U/L (ref 15–41)
Albumin: 3 g/dL — ABNORMAL LOW (ref 3.5–5.0)
Alkaline Phosphatase: 60 U/L (ref 38–126)
Anion gap: 9 (ref 5–15)
BUN: 13 mg/dL (ref 8–23)
CO2: 22 mmol/L (ref 22–32)
Calcium: 8.3 mg/dL — ABNORMAL LOW (ref 8.9–10.3)
Chloride: 109 mmol/L (ref 98–111)
Creatinine, Ser: 0.56 mg/dL (ref 0.44–1.00)
GFR calc Af Amer: >60 mL/min (ref >60)
GFR calc non Af Amer: >60 mL/min (ref >60)
Glucose, Bld: 89 mg/dL (ref 70–99)
Potassium: 3.4 mmol/L — ABNORMAL LOW (ref 3.5–5.1)
Sodium: 140 mmol/L (ref 135–145)
Total Bilirubin: 0.8 mg/dL (ref 0.3–1.2)
Total Protein: 6.6 g/dL (ref 6.5–8.1)

## 2019-02-10 LAB — TROPONIN I (HIGH SENSITIVITY): Troponin I (High Sensitivity): 5 ng/L (ref <18)

## 2019-02-10 LAB — FIBRINOGEN: Fibrinogen: 527 mg/dL — ABNORMAL HIGH (ref 210–475)

## 2019-02-10 LAB — FERRITIN: Ferritin: 156 ng/mL (ref 11–307)

## 2019-02-10 LAB — TRIGLYCERIDES: Triglycerides: 98 mg/dL (ref <150)

## 2019-02-10 LAB — PROCALCITONIN: Procalcitonin: <0.1 ng/mL

## 2019-02-10 LAB — FIBRIN DERIVATIVES D-DIMER (ARMC ONLY): Fibrin derivatives D-dimer (ARMC): 925.17 ng/mL (FEU) — ABNORMAL HIGH (ref 0.00–499.00)

## 2019-02-10 LAB — LACTATE DEHYDROGENASE: LDH: 174 U/L (ref 98–192)

## 2019-02-10 MED ORDER — ZINC SULFATE 220 (50 ZN) MG PO CAPS
220.0000 mg | ORAL_CAPSULE | Freq: Every day | ORAL | Status: DC
  Administered 2019-02-10 – 2019-02-12 (×3): 220 mg via ORAL
  Filled 2019-02-10 (×3): qty 1

## 2019-02-10 MED ORDER — ONDANSETRON HCL 4 MG/2ML IJ SOLN: 4.0000 mg | Freq: Four times a day (QID) | INTRAMUSCULAR | Status: DC | PRN

## 2019-02-10 MED ORDER — LINACLOTIDE 290 MCG PO CAPS
290.0000 ug | ORAL_CAPSULE | Freq: Every day | ORAL | Status: DC
  Filled 2019-02-10 (×2): qty 1

## 2019-02-10 MED ORDER — SODIUM CHLORIDE 0.9 % IV SOLN
1000.0000 mL | INTRAVENOUS | Status: DC
  Administered 2019-02-10: 1000 mL via INTRAVENOUS

## 2019-02-10 MED ORDER — GUAIFENESIN-DM 100-10 MG/5ML PO SYRP
10.0000 mL | ORAL_SOLUTION | ORAL | Status: DC | PRN
  Administered 2019-02-11 (×2): 10 mL via ORAL
  Filled 2019-02-10 (×2): qty 10

## 2019-02-10 MED ORDER — LEVOTHYROXINE SODIUM 100 MCG PO TABS
100.0000 ug | ORAL_TABLET | Freq: Every day | ORAL | Status: DC
  Administered 2019-02-11 – 2019-02-12 (×2): 100 ug via ORAL
  Filled 2019-02-10 (×3): qty 1

## 2019-02-10 MED ORDER — ALBUTEROL SULFATE HFA 108 (90 BASE) MCG/ACT IN AERS
2.0000 | INHALATION_SPRAY | Freq: Once | RESPIRATORY_TRACT | Status: AC
  Administered 2019-02-10: 2 via RESPIRATORY_TRACT
  Filled 2019-02-10: qty 6.7

## 2019-02-10 MED ORDER — PANTOPRAZOLE SODIUM 40 MG PO TBEC
40.0000 mg | DELAYED_RELEASE_TABLET | Freq: Every day | ORAL | Status: DC
  Administered 2019-02-10 – 2019-02-12 (×3): 40 mg via ORAL
  Filled 2019-02-10 (×3): qty 1

## 2019-02-10 MED ORDER — ONDANSETRON HCL 4 MG PO TABS: 4.0000 mg | ORAL_TABLET | Freq: Four times a day (QID) | ORAL | Status: DC | PRN

## 2019-02-10 MED ORDER — DEXAMETHASONE SODIUM PHOSPHATE 10 MG/ML IJ SOLN
6.0000 mg | Freq: Once | INTRAMUSCULAR | Status: AC
  Administered 2019-02-10: 6 mg via INTRAVENOUS
  Filled 2019-02-10: qty 1

## 2019-02-10 MED ORDER — ASCORBIC ACID 500 MG PO TABS
500.0000 mg | ORAL_TABLET | Freq: Every day | ORAL | Status: DC
  Administered 2019-02-10 – 2019-02-12 (×3): 500 mg via ORAL
  Filled 2019-02-10 (×4): qty 1

## 2019-02-10 MED ORDER — URSODIOL 300 MG PO CAPS
300.0000 mg | ORAL_CAPSULE | Freq: Two times a day (BID) | ORAL | Status: DC
  Administered 2019-02-11 – 2019-02-12 (×3): 300 mg via ORAL
  Filled 2019-02-10 (×6): qty 1

## 2019-02-10 MED ORDER — DEXAMETHASONE 4 MG PO TABS
6.0000 mg | ORAL_TABLET | ORAL | Status: DC
  Administered 2019-02-11 – 2019-02-12 (×2): 6 mg via ORAL
  Filled 2019-02-10 (×3): qty 1.5

## 2019-02-10 MED ORDER — SODIUM CHLORIDE 0.9 % IV BOLUS
500.0000 mL | Freq: Once | INTRAVENOUS | Status: AC
  Administered 2019-02-10: 500 mL via INTRAVENOUS

## 2019-02-10 MED ORDER — FLUTICASONE FUROATE-VILANTEROL 200-25 MCG/INH IN AEPB
1.0000 | INHALATION_SPRAY | Freq: Every day | RESPIRATORY_TRACT | Status: DC
  Administered 2019-02-11 – 2019-02-12 (×2): 1 via RESPIRATORY_TRACT
  Filled 2019-02-10 (×2): qty 28

## 2019-02-10 MED ORDER — POTASSIUM CHLORIDE CRYS ER 20 MEQ PO TBCR
40.0000 meq | EXTENDED_RELEASE_TABLET | Freq: Once | ORAL | Status: AC
  Administered 2019-02-10: 40 meq via ORAL
  Filled 2019-02-10: qty 2

## 2019-02-10 MED ORDER — IOHEXOL 350 MG/ML SOLN
75.0000 mL | Freq: Once | INTRAVENOUS | Status: AC | PRN
  Administered 2019-02-10: 75 mL via INTRAVENOUS
  Filled 2019-02-10: qty 75

## 2019-02-10 MED ORDER — ENOXAPARIN SODIUM 40 MG/0.4ML ~~LOC~~ SOLN
40.0000 mg | SUBCUTANEOUS | Status: DC
  Administered 2019-02-10 – 2019-02-11 (×2): 40 mg via SUBCUTANEOUS
  Filled 2019-02-10 (×3): qty 0.4

## 2019-02-10 MED ORDER — PREGABALIN 50 MG PO CAPS
50.0000 mg | ORAL_CAPSULE | Freq: Every day | ORAL | Status: DC
  Administered 2019-02-11 (×2): 50 mg via ORAL
  Filled 2019-02-10 (×2): qty 1

## 2019-02-10 MED ORDER — ACETAMINOPHEN 325 MG PO TABS
650.0000 mg | ORAL_TABLET | Freq: Four times a day (QID) | ORAL | Status: DC | PRN
  Administered 2019-02-10 – 2019-02-11 (×2): 650 mg via ORAL
  Filled 2019-02-10 (×2): qty 2

## 2019-02-10 MED ORDER — VENLAFAXINE HCL ER 150 MG PO CP24
150.0000 mg | ORAL_CAPSULE | Freq: Every day | ORAL | Status: DC
  Filled 2019-02-10: qty 2
  Filled 2019-02-10: qty 1

## 2019-02-10 MED ORDER — ALBUTEROL SULFATE HFA 108 (90 BASE) MCG/ACT IN AERS
2.0000 | INHALATION_SPRAY | Freq: Four times a day (QID) | RESPIRATORY_TRACT | Status: DC | PRN
  Filled 2019-02-10: qty 6.7

## 2019-02-10 MED ORDER — SODIUM CHLORIDE 0.9 % IV SOLN
200.0000 mg | Freq: Once | INTRAVENOUS | Status: AC
  Administered 2019-02-10: 200 mg via INTRAVENOUS
  Filled 2019-02-10: qty 200

## 2019-02-10 MED ORDER — SODIUM CHLORIDE 0.9 % IV SOLN
100.0000 mg | Freq: Every day | INTRAVENOUS | Status: DC
  Administered 2019-02-11 – 2019-02-12 (×2): 100 mg via INTRAVENOUS
  Filled 2019-02-10 (×2): qty 100
  Filled 2019-02-10: qty 20

## 2019-02-10 MED ORDER — METOCLOPRAMIDE HCL 5 MG/ML IJ SOLN
10.0000 mg | Freq: Once | INTRAMUSCULAR | Status: AC
  Administered 2019-02-10: 10 mg via INTRAVENOUS
  Filled 2019-02-10: qty 2

## 2019-02-10 MED ORDER — FENTANYL CITRATE (PF) 100 MCG/2ML IJ SOLN
50.0000 ug | INTRAMUSCULAR | Status: DC | PRN
  Administered 2019-02-10: 50 ug via INTRAVENOUS
  Filled 2019-02-10: qty 2

## 2019-02-10 NOTE — ED Triage Notes (Signed)
Pt is covid positive from 12/10, states she is not feeling any better , having pain with breathing, SOB, N/V/D/fever. States her fever broke around 6am this morning.

## 2019-02-10 NOTE — ED Notes (Signed)
Pt ambulated for a short distance with no assistance. Pt O2 dropped from 99% to 85% while ambulating. Per Dr. Quentin Cornwall pt placed on 2L o2.

## 2019-02-10 NOTE — Consult Note (Addendum)
Remdesivir - Pharmacy Brief Note   O:  ALT: 30 CXR: Patchy bibasilar atelectasis or early infiltrates. SpO2: 99% on room air, but dropped to 85% while ambulating and started on 2L O2.   A/P:  Patient tested positive for COVID on 01/29/19 and presented to the ED today with worsening SOB and chest discomfort. Has fever and chills as well along with exertional dyspnea. Was placed on steroids previously with no antibiotics. ED physician would like to start therapy for patient.  Remdesivir 200 mg IVPB once followed by 100 mg IVPB daily x 4 days.   Pearla Dubonnet, PharmD Clinical Pharmacist 02/10/2019 4:54 PM
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
## 2019-02-10 NOTE — ED Notes (Signed)
Pt ambulatory with no distress to the bathroom. Pt refusing to wear a mask. Pt states "I gotta go. Nobody answer the call bell." Call bell has not rung. Mask taken to pt at the bathroom and hung on the door knob.

## 2019-02-11 DIAGNOSIS — U071 COVID-19: Secondary | ICD-10-CM | POA: Diagnosis not present

## 2019-02-11 LAB — BRAIN NATRIURETIC PEPTIDE: B Natriuretic Peptide: 89 pg/mL (ref 0.0–100.0)

## 2019-02-11 LAB — COMPREHENSIVE METABOLIC PANEL
ALT: 34 U/L (ref 0–44)
AST: 21 U/L (ref 15–41)
Albumin: 2.9 g/dL — ABNORMAL LOW (ref 3.5–5.0)
Alkaline Phosphatase: 58 U/L (ref 38–126)
Anion gap: 8 (ref 5–15)
BUN: 14 mg/dL (ref 8–23)
CO2: 22 mmol/L (ref 22–32)
Calcium: 8.5 mg/dL — ABNORMAL LOW (ref 8.9–10.3)
Chloride: 110 mmol/L (ref 98–111)
Creatinine, Ser: 0.43 mg/dL — ABNORMAL LOW (ref 0.44–1.00)
GFR calc Af Amer: >60 mL/min (ref >60)
GFR calc non Af Amer: >60 mL/min (ref >60)
Glucose, Bld: 140 mg/dL — ABNORMAL HIGH (ref 70–99)
Potassium: 4.5 mmol/L (ref 3.5–5.1)
Sodium: 140 mmol/L (ref 135–145)
Total Bilirubin: 0.8 mg/dL (ref 0.3–1.2)
Total Protein: 6.2 g/dL — ABNORMAL LOW (ref 6.5–8.1)

## 2019-02-11 LAB — CBC WITH DIFFERENTIAL/PLATELET
Abs Immature Granulocytes: 0.02 10*3/uL (ref 0.00–0.07)
Basophils Absolute: 0 10*3/uL (ref 0.0–0.1)
Basophils Relative: 0 %
Eosinophils Absolute: 0 10*3/uL (ref 0.0–0.5)
Eosinophils Relative: 0 %
HCT: 34 % — ABNORMAL LOW (ref 36.0–46.0)
Hemoglobin: 11.9 g/dL — ABNORMAL LOW (ref 12.0–15.0)
Immature Granulocytes: 1 %
Lymphocytes Relative: 9 %
Lymphs Abs: 0.4 10*3/uL — ABNORMAL LOW (ref 0.7–4.0)
MCH: 29.2 pg (ref 26.0–34.0)
MCHC: 35 g/dL (ref 30.0–36.0)
MCV: 83.3 fL (ref 80.0–100.0)
Monocytes Absolute: 0.1 10*3/uL (ref 0.1–1.0)
Monocytes Relative: 2 %
Neutro Abs: 3.8 10*3/uL (ref 1.7–7.7)
Neutrophils Relative %: 88 %
Platelets: 122 10*3/uL — ABNORMAL LOW (ref 150–400)
RBC: 4.08 MIL/uL (ref 3.87–5.11)
RDW: 13.6 % (ref 11.5–15.5)
WBC: 4.4 10*3/uL (ref 4.0–10.5)
nRBC: 0 % (ref 0.0–0.2)

## 2019-02-11 LAB — C-REACTIVE PROTEIN: CRP: 4.7 mg/dL — ABNORMAL HIGH (ref <1.0)

## 2019-02-11 LAB — FERRITIN: Ferritin: 158 ng/mL (ref 11–307)

## 2019-02-11 LAB — FIBRIN DERIVATIVES D-DIMER (ARMC ONLY): Fibrin derivatives D-dimer (ARMC): 777.88 ng/mL (FEU) — ABNORMAL HIGH (ref 0.00–499.00)

## 2019-02-11 LAB — HIV ANTIBODY (ROUTINE TESTING W REFLEX): HIV Screen 4th Generation wRfx: NONREACTIVE

## 2019-02-11 LAB — PROCALCITONIN: Procalcitonin: <0.1 ng/mL

## 2019-02-11 MED ORDER — ACETAMINOPHEN 500 MG PO TABS
1000.0000 mg | ORAL_TABLET | Freq: Three times a day (TID) | ORAL | Status: DC | PRN
  Administered 2019-02-11 – 2019-02-12 (×2): 1000 mg via ORAL
  Filled 2019-02-11 (×2): qty 2

## 2019-02-11 MED ORDER — TAB-A-VITE/IRON PO TABS
1.0000 | ORAL_TABLET | Freq: Every day | ORAL | Status: DC
  Administered 2019-02-11: 1 via ORAL
  Filled 2019-02-11 (×3): qty 1

## 2019-02-11 NOTE — Progress Notes (Addendum)
PROGRESS NOTE    [name]  [id number] DOB: May 27, 1950 DOA: 02/10/2019 PCP: [name], MD    Assessment & Plan:   Active Problems:   COVID-19    [name] is a 68 y.o. Caucasian female with medical history significant for hx of asthma, obesity status post gastric sleeve last year, IBS, fibromyalgia, and hypothyroidism who presents with dyspnea, cough, and shortness of breath with low oxygen level.  In the ER, she was afebrile, heart rate and respirations normal, blood pressure normal, pulse ox 99% on room air, but desaturated to 85% with ambulation.   Coronavirus pneumonitis Patient presents with opacities on chest x-ray, dyspnea, and hypoxia with exertion in the setting of 2020 COVID-19 pandemic.  Based on the time course of symptoms starting on the 9th, she was 13 days out on presentation, and had had some hypoxia for several days at home reportedly.  Given this time frame and her failure to respond to Medrol, unsure the benefit of remdesivir and steroids and this time, however, per discussion by admitting hospitalist and ED provider, given her hypoxia and chest x-ray findings, she may certainly deteriorate and warrants in hospital observation. PLAN: --continue remdesivir day 2 --continue decadron --O2 sat test with ambulation tomorrow  Asthma Controlled at baseline.  No wheezing on exam to suggest bronchospasm. -Continue home Breo -Continue albuterol as needed  Fibromyalgia -Continue home Lyrica  Gallstones -Continue ursodiol  IBS -Continue Linzess  Depression --dc venlafaxine, as pt no longer takes  Hypothyroidism -Continue levothyroxine  Recent hx of gastric sleeve --Multi-vit plus iron supplement daily, per pt request   DVT prophylaxis: Lovenox SQ Code Status: Full code  Family Communication: not today Disposition Plan: home tomorrow   Subjective and Interval History:  Pt complained of nausea, though no vomiting.  Diarrhea  improved.  Also headache.  Chest wall pain from cough.  No fever, abdominal pain, dysuria, increased swelling.   Objective: Vitals:   02/11/19 0101 02/11/19 0614 02/11/19 1007 02/11/19 1412  BP: (!) 127/98 126/65  (!) 92/56  Pulse: (!) 50 (!) 58  64  Resp: 18 16  18   Temp: 97.6 F (36.4 C) 97.8 F (36.6 C)  98.8 F (37.1 C)  TempSrc: Oral Oral  Oral  SpO2: 100% 100% 98% 100%  Weight:      Height:        Intake/Output Summary (Last 24 hours) at 02/11/2019 1717 Last data filed at 02/11/2019 1347 Gross per 24 hour  Intake 390 ml  Output 0 ml  Net 390 ml   Filed Weights   02/10/19 1059  Weight: 78 kg    Examination:   Constitutional: NAD, AAOx3 HEENT: conjunctivae and lids normal, EOMI CV: RRR Distal pulses +2.  No cyanosis.   RESP: No obvious wheezes or crackles, poor effort, normal respiratory effort, on 2L (sating 100%) GI: +BS, NTND Extremities: No effusions, edema, or tenderness in BLE SKIN: warm, dry and intact Neuro: II - XII grossly intact.  Sensation intact   Data Reviewed: I have personally reviewed following labs and imaging studies  CBC: Recent Labs  Lab 02/10/19 1105 02/11/19 0745  WBC 5.8 4.4  NEUTROABS  --  3.8  HGB 12.6 11.9*  HCT 36.8 34.0*  MCV 87.4 83.3  PLT 153 [id number]*   Basic Metabolic Panel: Recent Labs  Lab 02/10/19 1105 02/11/19 0745  NA 140 140  K 3.4* 4.5  CL 109 110  CO2 22 22  GLUCOSE 89 140*  BUN 13 14  CREATININE  0.56 0.43*  CALCIUM 8.3* 8.5*   GFR: Estimated Creatinine Clearance: 62.2 mL/min (A) (by C-G formula based on SCr of 0.43 mg/dL (L)). Liver Function Tests: Recent Labs  Lab 02/10/19 1105 02/11/19 0745  AST 25 21  ALT 30 34  ALKPHOS 60 58  BILITOT 0.8 0.8  PROT 6.6 6.2*  ALBUMIN 3.0* 2.9*   No results for input(s): LIPASE, AMYLASE in the last 168 hours. No results for input(s): AMMONIA in the last 168 hours. Coagulation Profile: No results for input(s): INR, PROTIME in the last 168 hours. Cardiac  Enzymes: No results for input(s): CKTOTAL, CKMB, CKMBINDEX, TROPONINI in the last 168 hours. BNP (last 3 results) No results for input(s): PROBNP in the last 8760 hours. HbA1C: No results for input(s): HGBA1C in the last 72 hours. CBG: No results for input(s): GLUCAP in the last 168 hours. Lipid Profile: Recent Labs    02/10/19 1704  TRIG 98   Thyroid Function Tests: No results for input(s): TSH, T4TOTAL, FREET4, T3FREE, THYROIDAB in the last 72 hours. Anemia Panel: Recent Labs    02/10/19 1704 02/11/19 0745  FERRITIN 156 158   Sepsis Labs: Recent Labs  Lab 02/10/19 1704 02/11/19 0745  PROCALCITON <0.10 <0.10    No results found for this or any previous visit (from the past 240 hour(s)).    Radiology Studies: DG Chest 2 View  Result Date: 02/10/2019 CLINICAL DATA:  COVID positive on 12/10. Shortness of breath. Chest pain EXAM: CHEST - 2 VIEW COMPARISON:  01/29/2019 FINDINGS: Patchy bibasilar opacities are noted, new since prior study. This is slightly greater on the right. Heart is normal size. No effusions or acute bony abnormality. IMPRESSION: Patchy bibasilar atelectasis or early infiltrates. Electronically Signed   By: Rolm Baptise M.D.   On: 02/10/2019 11:38   CT Angio Chest PE W and/or Wo Contrast  Result Date: 02/10/2019 CLINICAL DATA:  Shortness of breath, nausea, vomiting and diarrhea. The patient tested positive for COVID-19 01/29/2019. EXAM: CT ANGIOGRAPHY CHEST WITH CONTRAST TECHNIQUE: Multidetector CT imaging of the chest was performed using the standard protocol during bolus administration of intravenous contrast. Multiplanar CT image reconstructions and MIPs were obtained to evaluate the vascular anatomy. CONTRAST:  75 mL OMNIPAQUE IOHEXOL 350 MG/ML SOLN COMPARISON:  PA and lateral chest earlier today and single-view of the chest 01/29/2019. CT chest 08/12/2017. FINDINGS: Cardiovascular: No pulmonary embolus is identified. There is cardiomegaly. No  pericardial effusion. Mediastinum/Nodes: No enlarged mediastinal, hilar, or axillary lymph nodes. Thyroid gland, trachea, and esophagus demonstrate no significant findings. Lungs/Pleura: There are trace bilateral pleural effusions. Multiple areas of ground-glass attenuating airspace disease are present bilaterally and somewhat greater on the right. No nodule or mass. Upper Abdomen: No acute abnormality. The patient is status post gastric bypass. Musculoskeletal: No acute or focal abnormality. Cervical and thoracic spondylosis noted. Review of the MIP images confirms the above findings. IMPRESSION: 1. Negative for pulmonary embolus. 2. Multiple areas of ground-glass attenuating airspace disease bilaterally and somewhat greater on the right are most consistent with multifocal pneumonia. The appearance is typical for COVID-19 pneumonia. 3. Trace bilateral pleural effusions. 4. Cardiomegaly. 5. Status post gastric bypass. Electronically Signed   By: Inge Rise M.D.   On: 02/10/2019 16:32     Scheduled Meds: . vitamin C  500 mg Oral Daily  . dexamethasone  6 mg Oral Q24H  . enoxaparin (LOVENOX) injection  40 mg Subcutaneous Q24H  . fluticasone furoate-vilanterol  1 puff Inhalation Daily  . levothyroxine  100  mcg Oral QAC breakfast  . linaclotide  290 mcg Oral QAC breakfast  . multivitamins with iron  1 tablet Oral Daily  . pantoprazole  40 mg Oral Daily  . pregabalin  50 mg Oral QHS  . ursodiol  300 mg Oral BID  . zinc sulfate  220 mg Oral Daily   Continuous Infusions: . remdesivir 100 mg in NS 100 mL Stopped (02/11/19 1210)     LOS: 0 days     Enzo Bi, MD [facility] If 7PM-7AM, please contact night-coverage 02/11/2019, 5:17 PM

## 2019-02-12 DIAGNOSIS — U071 COVID-19: Secondary | ICD-10-CM | POA: Diagnosis not present

## 2019-02-12 LAB — CBC WITH DIFFERENTIAL/PLATELET
Abs Immature Granulocytes: 0.04 10*3/uL (ref 0.00–0.07)
Basophils Absolute: 0 10*3/uL (ref 0.0–0.1)
Basophils Relative: 0 %
Eosinophils Absolute: 0 10*3/uL (ref 0.0–0.5)
Eosinophils Relative: 0 %
HCT: 35.1 % — ABNORMAL LOW (ref 36.0–46.0)
Hemoglobin: 11.8 g/dL — ABNORMAL LOW (ref 12.0–15.0)
Immature Granulocytes: 1 %
Lymphocytes Relative: 8 %
Lymphs Abs: 0.6 10*3/uL — ABNORMAL LOW (ref 0.7–4.0)
MCH: 29.4 pg (ref 26.0–34.0)
MCHC: 33.6 g/dL (ref 30.0–36.0)
MCV: 87.3 fL (ref 80.0–100.0)
Monocytes Absolute: 0.2 10*3/uL (ref 0.1–1.0)
Monocytes Relative: 2 %
Neutro Abs: 7 10*3/uL (ref 1.7–7.7)
Neutrophils Relative %: 89 %
Platelets: 143 10*3/uL — ABNORMAL LOW (ref 150–400)
RBC: 4.02 MIL/uL (ref 3.87–5.11)
RDW: 13.7 % (ref 11.5–15.5)
WBC: 7.9 10*3/uL (ref 4.0–10.5)
nRBC: 0 % (ref 0.0–0.2)

## 2019-02-12 LAB — COMPREHENSIVE METABOLIC PANEL
ALT: 30 U/L (ref 0–44)
AST: 16 U/L (ref 15–41)
Albumin: 2.9 g/dL — ABNORMAL LOW (ref 3.5–5.0)
Alkaline Phosphatase: 61 U/L (ref 38–126)
Anion gap: 9 (ref 5–15)
BUN: 13 mg/dL (ref 8–23)
CO2: 22 mmol/L (ref 22–32)
Calcium: 8.7 mg/dL — ABNORMAL LOW (ref 8.9–10.3)
Chloride: 111 mmol/L (ref 98–111)
Creatinine, Ser: 0.46 mg/dL (ref 0.44–1.00)
GFR calc Af Amer: >60 mL/min (ref >60)
GFR calc non Af Amer: >60 mL/min (ref >60)
Glucose, Bld: 135 mg/dL — ABNORMAL HIGH (ref 70–99)
Potassium: 5 mmol/L (ref 3.5–5.1)
Sodium: 142 mmol/L (ref 135–145)
Total Bilirubin: 0.5 mg/dL (ref 0.3–1.2)
Total Protein: 6.2 g/dL — ABNORMAL LOW (ref 6.5–8.1)

## 2019-02-12 LAB — FIBRIN DERIVATIVES D-DIMER (ARMC ONLY): Fibrin derivatives D-dimer (ARMC): 622.46 ng/mL (FEU) — ABNORMAL HIGH (ref 0.00–499.00)

## 2019-02-12 LAB — C-REACTIVE PROTEIN: CRP: 1.6 mg/dL — ABNORMAL HIGH (ref <1.0)

## 2019-02-12 LAB — FERRITIN: Ferritin: 164 ng/mL (ref 11–307)

## 2019-02-12 MED ORDER — SODIUM CHLORIDE 0.9 % IV SOLN
INTRAVENOUS | Status: DC | PRN
  Administered 2019-02-12: 10 mL via INTRAVENOUS

## 2019-02-12 MED ORDER — GUAIFENESIN-DM 100-10 MG/5ML PO SYRP: 10.0000 mL | ORAL_SOLUTION | ORAL | 0 refills | Status: DC | PRN

## 2019-02-12 MED ORDER — LINACLOTIDE 290 MCG PO CAPS: 290.0000 ug | ORAL_CAPSULE | Freq: Every day | ORAL | Status: DC

## 2019-02-12 MED ORDER — METOPROLOL TARTRATE 50 MG PO TABS: ORAL_TABLET | ORAL | Status: DC

## 2019-02-12 MED ORDER — DEXAMETHASONE 6 MG PO TABS: 6.0000 mg | ORAL_TABLET | ORAL | 0 refills | Status: AC

## 2019-02-12 NOTE — Progress Notes (Signed)
[name] to be D/C'd Home per MD order.  Discussed prescriptions and follow up appointments with the patient. Prescriptions given to patient, medication list explained in detail. Pt verbalized understanding.  Allergies as of 02/12/2019      Reactions   Bee Venom Anaphylaxis   Wasp Venom Anaphylaxis   Codeine Nausea Only   Pt reported nausea to nurse when asked about allergy on 10/27/14   Hydrocodone-acetaminophen Other (See Comments)   Halluninations   Mango Flavor Hives   Procaine Hcl    Pt not sure of reaction   Tramadol Nausea And Vomiting   Latex Rash      Medication List    STOP taking these medications   venlafaxine XR 75 MG 24 hr capsule Commonly known as: EFFEXOR-XR     TAKE these medications   albuterol 108 (90 Base) MCG/ACT inhaler Commonly known as: VENTOLIN HFA Inhale 2 puffs into the lungs every 6 (six) hours as needed for wheezing or shortness of breath.   atorvastatin 80 MG tablet Commonly known as: LIPITOR Take 80 mg by mouth at bedtime.   budesonide-formoterol 160-4.5 MCG/ACT inhaler Commonly known as: SYMBICORT Inhale 2 puffs into the lungs 2 (two) times daily.   cyclobenzaprine 10 MG tablet Commonly known as: FLEXERIL Take 10 mg by mouth 3 (three) times daily.   dexamethasone 6 MG tablet Commonly known as: DECADRON Take 1 tablet (6 mg total) by mouth daily for 5 days. Steroid to help with your COVID. Start taking on: February 13, 2019   guaiFENesin-dextromethorphan 100-10 MG/5ML syrup Commonly known as: ROBITUSSIN DM Take 10 mLs by mouth every 4 (four) hours as needed for cough.   IRON PO Take by mouth.   levothyroxine 100 MCG tablet Commonly known as: SYNTHROID Take 100 mcg by mouth daily before breakfast.   linaclotide 290 MCG Caps capsule Commonly known as: Linzess Take 1 capsule (290 mcg total) by mouth daily before breakfast. Take 1 capsule before breakfast.   magnesium 30 MG tablet Take 40 mg by mouth 2 (two) times daily.    metoprolol tartrate 50 MG tablet Commonly known as: LOPRESSOR Hold this medication until your PCP followup, because your heart rate was already low in 50's without this medication. What changed:   how much to take  how to take this  when to take this  additional instructions   nitroGLYCERIN 0.4 MG SL tablet Commonly known as: NITROSTAT Place 0.4 mg under the tongue every 5 (five) minutes as needed for chest pain.   pantoprazole 40 MG tablet Commonly known as: PROTONIX TAKE 1 TABLET BY MOUTH EVERY DAY What changed: additional instructions   pregabalin 50 MG capsule Commonly known as: LYRICA Take 50 mg by mouth at bedtime.   Restasis 0.05 % ophthalmic emulsion Generic drug: cycloSPORINE Place 1 drop into both eyes 2 (two) times daily.   ursodiol 300 MG capsule Commonly known as: ACTIGALL Take 300 mg by mouth 2 (two) times daily.   vitamin E 400 UNIT capsule Commonly known as: vitamin E Take 2 capsules (800 Units total) by mouth daily.       Vitals:   02/12/19 0826 02/12/19 0953  BP:    Pulse:    Resp:    Temp:    SpO2: 98% 100%    Skin clean, dry and intact without evidence of skin break down, no evidence of skin tears noted. IV catheter discontinued intact. Site without signs and symptoms of complications. Dressing and pressure applied. Pt denies pain at  this time. No complaints noted.  An After Visit Summary was printed and given to the patient. Patient escorted via [place], and D/C home via private auto.  [name] 02/12/2019 2:39 PM

## 2019-02-12 NOTE — Discharge Summary (Signed)
Physician Discharge Summary   [name]  female DOB: 1951/01/22  [id number]  PCP: [name], MD  Admit date: 02/10/2019 Discharge date: 02/12/2019  Admitted From: home Disposition:  home CODE STATUS: Full code  Discharge Instructions    Diet - low sodium heart healthy   Complete by: As directed    Increase activity slowly   Complete by: As directed        Hospital Course:  For full details, please see H&P, progress notes, consult notes and ancillary notes.  Briefly,  [name] is a 68 y.o. Caucasian female with medical history significant for hx of asthma, obesity status post gastric sleeve last year, IBS, fibromyalgia, and hypothyroidismwho presented withdyspnea, cough, and shortness of breath with low oxygen saturation level.  In the ER, she was afebrile, heart rate and respirations normal, blood pressure normal, pulse ox 99% on room air, but desaturated to 85% with ambulation.   Coronavirus pneumonia On presentation, pt was afebrile, HDS, pulse ox 99% on room air, but desat to 85% with ambulation.  CXR showed Patchy bibasilar opacities.  Based on the time course of symptoms starting on the9th,she was 13 days out on presentation, and had had some hypoxia for several days at home reportedly.Given this time frame and her failure to respond to Medrol prescribed outpatient, unsure the benefit of remdesivir and steroids and this time, however, per discussion by admitting hospitalist and ED provider, given her hypoxia and chest x-ray findings, she may certainly deteriorate and warranted in-hospital observation.  Pt received 3 days of Remdesivir and Decadron.  Prior to discharge, pt was feeling well, able to ambulate in room air without O2 desat.    Asthma, stable Controlled at baseline. No wheezing on exam to suggest bronchospasm.  Continued home Symbicort as Breo and albuterol as needed.  Fibromyalgia Continued home Lyrica  Gallstones Continued  ursodiol  IBS Continued Linzess  Depression, stable Not currently taking medication for depression.  Hypothyroidism Continued levothyroxine  Recent hx of gastric sleeve Received Multi-vit plus iron supplement daily, per pt request.   Discharge Diagnoses:  Active Problems:   Hypothyroidism   COVID-19   IBS (irritable bowel syndrome)   Gallstones   Depression   Asthma   Obesity (BMI 30-39.9)   History of gastric surgery   Discharge Instructions:  Allergies as of 02/12/2019      Reactions   Bee Venom Anaphylaxis   Wasp Venom Anaphylaxis   Codeine Nausea Only   Pt reported nausea to nurse when asked about allergy on 10/27/14   Hydrocodone-acetaminophen Other (See Comments)   Halluninations   Mango Flavor Hives   Procaine Hcl    Pt not sure of reaction   Tramadol Nausea And Vomiting   Latex Rash      Medication List    STOP taking these medications   venlafaxine XR 75 MG 24 hr capsule Commonly known as: EFFEXOR-XR     TAKE these medications   albuterol 108 (90 Base) MCG/ACT inhaler Commonly known as: VENTOLIN HFA Inhale 2 puffs into the lungs every 6 (six) hours as needed for wheezing or shortness of breath.   atorvastatin 80 MG tablet Commonly known as: LIPITOR Take 80 mg by mouth at bedtime.   budesonide-formoterol 160-4.5 MCG/ACT inhaler Commonly known as: SYMBICORT Inhale 2 puffs into the lungs 2 (two) times daily.   cyclobenzaprine 10 MG tablet Commonly known as: FLEXERIL Take 10 mg by mouth 3 (three) times daily.   dexamethasone 6 MG tablet Commonly  known as: DECADRON Take 1 tablet (6 mg total) by mouth daily for 5 days. Steroid to help with your COVID.   guaiFENesin-dextromethorphan 100-10 MG/5ML syrup Commonly known as: ROBITUSSIN DM Take 10 mLs by mouth every 4 (four) hours as needed for cough.   IRON PO Take by mouth.   levothyroxine 100 MCG tablet Commonly known as: SYNTHROID Take 100 mcg by mouth daily before breakfast.    linaclotide 290 MCG Caps capsule Commonly known as: Linzess Take 1 capsule (290 mcg total) by mouth daily before breakfast. Take 1 capsule before breakfast.   magnesium 30 MG tablet Take 40 mg by mouth 2 (two) times daily.   metoprolol tartrate 50 MG tablet Commonly known as: LOPRESSOR Hold this medication until your PCP followup, because your heart rate was already low in 50's without this medication. What changed:   how much to take  how to take this  when to take this  additional instructions   nitroGLYCERIN 0.4 MG SL tablet Commonly known as: NITROSTAT Place 0.4 mg under the tongue every 5 (five) minutes as needed for chest pain.   pantoprazole 40 MG tablet Commonly known as: PROTONIX TAKE 1 TABLET BY MOUTH EVERY DAY What changed: additional instructions   pregabalin 50 MG capsule Commonly known as: LYRICA Take 50 mg by mouth at bedtime.   Restasis 0.05 % ophthalmic emulsion Generic drug: cycloSPORINE Place 1 drop into both eyes 2 (two) times daily.   ursodiol 300 MG capsule Commonly known as: ACTIGALL Take 300 mg by mouth 2 (two) times daily.   vitamin E 400 UNIT capsule Commonly known as: vitamin E Take 2 capsules (800 Units total) by mouth daily.         Allergies  Allergen Reactions  . Bee Venom Anaphylaxis  . Wasp Venom Anaphylaxis  . Codeine Nausea Only    Pt reported nausea to nurse when asked about allergy on 10/27/14  . Hydrocodone-Acetaminophen Other (See Comments)    Halluninations  . Mango Flavor Hives  . Procaine Hcl     Pt not sure of reaction  . Tramadol Nausea And Vomiting  . Latex Rash     The results of significant diagnostics from this hospitalization (including imaging, microbiology, ancillary and laboratory) are listed below for reference.   Consultations:   Procedures/Studies: DG Chest 2 View  Result Date: 02/10/2019 CLINICAL DATA:  COVID positive on 12/10. Shortness of breath. Chest pain EXAM: CHEST - 2 VIEW  COMPARISON:  01/29/2019 FINDINGS: Patchy bibasilar opacities are noted, new since prior study. This is slightly greater on the right. Heart is normal size. No effusions or acute bony abnormality. IMPRESSION: Patchy bibasilar atelectasis or early infiltrates. Electronically Signed   By: Rolm Baptise M.D.   On: 02/10/2019 11:38   DG Chest 2 View  Result Date: 01/17/2019 CLINICAL DATA:  Patient with headache and chest pain EXAM: CHEST - 2 VIEW COMPARISON:  Chest radiograph 07/11/2017. FINDINGS: The heart size and mediastinal contours are within normal limits. Both lungs are clear. The visualized skeletal structures are unremarkable. IMPRESSION: No active cardiopulmonary disease. Electronically Signed   By: Lovey Newcomer M.D.   On: 01/17/2019 13:52   CT Angio Chest PE W and/or Wo Contrast  Result Date: 02/10/2019 CLINICAL DATA:  Shortness of breath, nausea, vomiting and diarrhea. The patient tested positive for COVID-19 01/29/2019. EXAM: CT ANGIOGRAPHY CHEST WITH CONTRAST TECHNIQUE: Multidetector CT imaging of the chest was performed using the standard protocol during bolus administration of intravenous contrast. Multiplanar CT  image reconstructions and MIPs were obtained to evaluate the vascular anatomy. CONTRAST:  75 mL OMNIPAQUE IOHEXOL 350 MG/ML SOLN COMPARISON:  PA and lateral chest earlier today and single-view of the chest 01/29/2019. CT chest 08/12/2017. FINDINGS: Cardiovascular: No pulmonary embolus is identified. There is cardiomegaly. No pericardial effusion. Mediastinum/Nodes: No enlarged mediastinal, hilar, or axillary lymph nodes. Thyroid gland, trachea, and esophagus demonstrate no significant findings. Lungs/Pleura: There are trace bilateral pleural effusions. Multiple areas of ground-glass attenuating airspace disease are present bilaterally and somewhat greater on the right. No nodule or mass. Upper Abdomen: No acute abnormality. The patient is status post gastric bypass. Musculoskeletal: No  acute or focal abnormality. Cervical and thoracic spondylosis noted. Review of the MIP images confirms the above findings. IMPRESSION: 1. Negative for pulmonary embolus. 2. Multiple areas of ground-glass attenuating airspace disease bilaterally and somewhat greater on the right are most consistent with multifocal pneumonia. The appearance is typical for COVID-19 pneumonia. 3. Trace bilateral pleural effusions. 4. Cardiomegaly. 5. Status post gastric bypass. Electronically Signed   By: Inge Rise M.D.   On: 02/10/2019 16:32   DG Chest Portable 1 View  Result Date: 01/29/2019 CLINICAL DATA:  Fever and cough for 2 days, recent COVID-19 exposure EXAM: PORTABLE CHEST 1 VIEW COMPARISON:  01/17/2019 FINDINGS: The heart size and mediastinal contours are within normal limits. Both lungs are clear. The visualized skeletal structures show degenerative change of the thoracic spine. Postsurgical changes are noted in cervical spine. IMPRESSION: No active disease. Electronically Signed   By: Inez Catalina M.D.   On: 01/29/2019 17:58      Labs: BNP (last 3 results) Recent Labs    02/11/19 0745  BNP 99991111   Basic Metabolic Panel: Recent Labs  Lab 02/10/19 1105 02/11/19 0745 02/12/19 0619  NA 140 140 142  K 3.4* 4.5 5.0  CL 109 110 111  CO2 22 22 22   GLUCOSE 89 140* 135*  BUN 13 14 13   CREATININE 0.56 0.43* 0.46  CALCIUM 8.3* 8.5* 8.7*   Liver Function Tests: Recent Labs  Lab 02/10/19 1105 02/11/19 0745 02/12/19 0619  AST 25 21 16   ALT 30 34 30  ALKPHOS 60 58 61  BILITOT 0.8 0.8 0.5  PROT 6.6 6.2* 6.2*  ALBUMIN 3.0* 2.9* 2.9*   No results for input(s): LIPASE, AMYLASE in the last 168 hours. No results for input(s): AMMONIA in the last 168 hours. CBC: Recent Labs  Lab 02/10/19 1105 02/11/19 0745 02/12/19 0619  WBC 5.8 4.4 7.9  NEUTROABS  --  3.8 7.0  HGB 12.6 11.9* 11.8*  HCT 36.8 34.0* 35.1*  MCV 87.4 83.3 87.3  PLT 153 122* 143*   Cardiac Enzymes: No results for  input(s): CKTOTAL, CKMB, CKMBINDEX, TROPONINI in the last 168 hours. BNP: Invalid input(s): POCBNP CBG: No results for input(s): GLUCAP in the last 168 hours. D-Dimer No results for input(s): DDIMER in the last 72 hours. Hgb A1c No results for input(s): HGBA1C in the last 72 hours. Lipid Profile No results for input(s): CHOL, HDL, LDLCALC, TRIG, CHOLHDL, LDLDIRECT in the last 72 hours. Thyroid function studies No results for input(s): TSH, T4TOTAL, T3FREE, THYROIDAB in the last 72 hours.  Invalid input(s): FREET3 Anemia work up Recent Labs    02/12/19 0619  FERRITIN 164   Urinalysis    Component Value Date/Time   COLORURINE YELLOW 06/22/2015 1008   APPEARANCEUR CLOUDY (A) 06/22/2015 1008   LABSPEC 1.025 06/22/2015 1008   PHURINE 5.5 06/22/2015 1008   GLUCOSEU 100 (  A) 06/22/2015 1008   HGBUR NEGATIVE 06/22/2015 1008   [place] 06/22/2015 1008   KETONESUR NEGATIVE 06/22/2015 1008   PROTEINUR NEGATIVE 06/22/2015 1008   NITRITE NEGATIVE 06/22/2015 1008   LEUKOCYTESUR NEGATIVE 06/22/2015 1008   Sepsis Labs Invalid input(s): PROCALCITONIN,  WBC,  LACTICIDVEN Microbiology No results found for this or any previous visit (from the past 240 hour(s)).   Total time spend on discharging this patient, including the last patient exam, discussing the hospital stay, instructions for ongoing care as it relates to all pertinent caregivers, as well as preparing the medical discharge records, prescriptions, and/or referrals as applicable, is 35 minutes.    Enzo Bi, MD  [facility] 02/14/2019, 7:49 PM  If 7PM-7AM, please contact night-coverage

## 2019-02-14 DIAGNOSIS — K589 Irritable bowel syndrome without diarrhea: Secondary | ICD-10-CM | POA: Diagnosis present

## 2019-02-14 DIAGNOSIS — F329 Major depressive disorder, single episode, unspecified: Secondary | ICD-10-CM | POA: Diagnosis present

## 2019-02-14 DIAGNOSIS — E669 Obesity, unspecified: Secondary | ICD-10-CM | POA: Diagnosis present

## 2019-02-14 DIAGNOSIS — Z9889 Other specified postprocedural states: Secondary | ICD-10-CM

## 2019-02-14 DIAGNOSIS — J45909 Unspecified asthma, uncomplicated: Secondary | ICD-10-CM | POA: Diagnosis present

## 2019-02-14 DIAGNOSIS — F32A Depression, unspecified: Secondary | ICD-10-CM | POA: Diagnosis present

## 2019-02-14 DIAGNOSIS — K8020 Calculus of gallbladder without cholecystitis without obstruction: Secondary | ICD-10-CM | POA: Diagnosis present

## 2019-03-16 DIAGNOSIS — Z8616 Personal history of COVID-19: Secondary | ICD-10-CM

## 2019-03-16 HISTORY — DX: Personal history of COVID-19: Z86.16

## 2019-03-19 DIAGNOSIS — E876 Hypokalemia: Secondary | ICD-10-CM | POA: Insufficient documentation

## 2019-05-07 ENCOUNTER — Other Ambulatory Visit: Payer: Self-pay | Admitting: Physician Assistant

## 2019-05-21 ENCOUNTER — Telehealth: Payer: Self-pay | Admitting: Internal Medicine

## 2019-05-21 ENCOUNTER — Other Ambulatory Visit: Payer: Self-pay | Admitting: Gastroenterology

## 2019-05-21 NOTE — Telephone Encounter (Signed)
Pt called again stating that she called her pharmacy to confirm who refilled med last time and they told her that it was Dr. [name]. Pt states that she has been experiencing stomach pain for 2 weeks so she would like to get rf asap.

## 2019-05-21 NOTE — Telephone Encounter (Signed)
Dr [name]- Please see 11/07/18 telephone note... Do you still want patient to be seen for her elevated lft's? Looks like they have normalized at this point...  At this point she is calling c/o abdominal pain related to not taking Linzess for a couple weeks since she ran out of refills, but I told her we may need to see her in office still. 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 Says she would need to do a virtual visit.   Please advise.... refill Linzess as is or office visit for lft's and constipation follow up and send refills until the visit?

## 2019-05-21 NOTE — Telephone Encounter (Signed)
Pt requested a refill for Linzess.  System shows that rx was prescribed by Dr. Billie Ruddy but pt insisted that Dr. [name] had prescribed Linzess.

## 2019-05-25 MED ORDER — LINACLOTIDE 290 MCG PO CAPS: 290.0000 ug | ORAL_CAPSULE | Freq: Every day | ORAL | 1 refills | Status: DC

## 2019-05-25 MED ORDER — PANTOPRAZOLE SODIUM 40 MG PO TBEC: DELAYED_RELEASE_TABLET | ORAL | 1 refills | Status: DC

## 2019-05-25 NOTE — Telephone Encounter (Signed)
I have spoken to patient and have refilled both linzess and pantoprazole as she states she is out of that medication too until her phone visit on 07/22/19.

## 2019-05-25 NOTE — Telephone Encounter (Signed)
okay to refill Linzess until virtual visit

## 2019-05-26 ENCOUNTER — Other Ambulatory Visit: Payer: Self-pay | Admitting: Physician Assistant

## 2019-05-26 ENCOUNTER — Other Ambulatory Visit: Payer: Self-pay | Admitting: Internal Medicine

## 2019-05-26 NOTE — Telephone Encounter (Signed)
Rx resent to pharmacy

## 2019-05-26 NOTE — Telephone Encounter (Signed)
Patient called said [facility] told her they do not have Linzess please resend

## 2019-05-29 ENCOUNTER — Ambulatory Visit: Payer: Medicare Other | Admitting: Gastroenterology

## 2019-06-17 ENCOUNTER — Other Ambulatory Visit: Payer: Self-pay | Admitting: Internal Medicine

## 2019-07-08 ENCOUNTER — Encounter: Payer: Self-pay | Admitting: *Deleted

## 2019-07-14 ENCOUNTER — Other Ambulatory Visit: Payer: Self-pay | Admitting: Internal Medicine

## 2019-07-16 ENCOUNTER — Other Ambulatory Visit: Payer: Self-pay

## 2019-07-16 ENCOUNTER — Emergency Department
Admission: EM | Admit: 2019-07-16 | Discharge: 2019-07-16 | Disposition: A | Discharge status: Home / Self Care | Payer: Medicare Other | Attending: Emergency Medicine | Admitting: Emergency Medicine

## 2019-07-16 DIAGNOSIS — Y999 Unspecified external cause status: Secondary | ICD-10-CM | POA: Diagnosis not present

## 2019-07-16 DIAGNOSIS — E039 Hypothyroidism, unspecified: Secondary | ICD-10-CM | POA: Diagnosis not present

## 2019-07-16 DIAGNOSIS — Z23 Encounter for immunization: Secondary | ICD-10-CM | POA: Insufficient documentation

## 2019-07-16 DIAGNOSIS — I10 Essential (primary) hypertension: Secondary | ICD-10-CM | POA: Diagnosis not present

## 2019-07-16 DIAGNOSIS — Y929 Unspecified place or not applicable: Secondary | ICD-10-CM | POA: Insufficient documentation

## 2019-07-16 DIAGNOSIS — S41152A Open bite of left upper arm, initial encounter: Secondary | ICD-10-CM | POA: Insufficient documentation

## 2019-07-16 DIAGNOSIS — W540XXA Bitten by dog, initial encounter: Secondary | ICD-10-CM | POA: Insufficient documentation

## 2019-07-16 DIAGNOSIS — Y939 Activity, unspecified: Secondary | ICD-10-CM | POA: Insufficient documentation

## 2019-07-16 DIAGNOSIS — S4992XA Unspecified injury of left shoulder and upper arm, initial encounter: Secondary | ICD-10-CM | POA: Diagnosis present

## 2019-07-16 MED ORDER — OXYCODONE HCL 5 MG PO TABS
5.0000 mg | ORAL_TABLET | Freq: Once | ORAL | Status: AC
  Administered 2019-07-16: 5 mg via ORAL
  Filled 2019-07-16: qty 1

## 2019-07-16 MED ORDER — LIDOCAINE HCL (PF) 1 % IJ SOLN
5.0000 mL | Freq: Once | INTRAMUSCULAR | Status: AC
  Administered 2019-07-16: 5 mL
  Filled 2019-07-16: qty 5

## 2019-07-16 MED ORDER — BACITRACIN-NEOMYCIN-POLYMYXIN 400-5-5000 EX OINT
TOPICAL_OINTMENT | CUTANEOUS | Status: AC
  Filled 2019-07-16: qty 1

## 2019-07-16 MED ORDER — AMOXICILLIN-POT CLAVULANATE 875-125 MG PO TABS: 1.0000 | ORAL_TABLET | Freq: Two times a day (BID) | ORAL | 0 refills | Status: DC

## 2019-07-16 MED ORDER — BACITRACIN-NEOMYCIN-POLYMYXIN OINTMENT TUBE
TOPICAL_OINTMENT | Freq: Once | CUTANEOUS | Status: AC
  Filled 2019-07-16 (×2): qty 14.17

## 2019-07-16 MED ORDER — TETANUS-DIPHTH-ACELL PERTUSSIS 5-2.5-18.5 LF-MCG/0.5 IM SUSP
0.5000 mL | Freq: Once | INTRAMUSCULAR | Status: AC
  Administered 2019-07-16: 0.5 mL via INTRAMUSCULAR
  Filled 2019-07-16: qty 0.5

## 2019-07-16 MED ORDER — OXYCODONE HCL 5 MG PO TABS: 5.0000 mg | ORAL_TABLET | Freq: Four times a day (QID) | ORAL | 0 refills | Status: DC | PRN

## 2019-07-16 MED ORDER — ACETAMINOPHEN 325 MG PO TABS
ORAL_TABLET | ORAL | Status: AC
  Administered 2019-07-16: 650 mg via ORAL
  Filled 2019-07-16: qty 2

## 2019-07-16 MED ORDER — ACETAMINOPHEN 325 MG PO TABS: 650.0000 mg | ORAL_TABLET | Freq: Once | ORAL | Status: AC

## 2019-07-16 NOTE — ED Triage Notes (Signed)
Pt comes into the ED via EMS from home with c/o dog bite to the LUE today by her husbands dog, animal control was on the scene per EMS report. This is the second time the dog has bit her. Bleeding is controlled, pt is in NAD.

## 2019-07-16 NOTE — Discharge Instructions (Signed)
Follow-up with your primary care provider or return to the emergency department for suture removal in 10 to 12 days.  Also begin taking the Augmentin 875 twice daily until finished for infection prevention.  If you see any pus, increased fever or worsening return to the emergency department.  You were given a tetanus booster today.  Also if animal control contacted you to let you know that you need to get rabies vaccine you will have to return to the emergency department.  Prescriptions for oxycodone was sent to your pharmacy if needed for pain.  You need to clean the dog bites daily with mild soap and water and allowed to dry before putting a bandage on it.

## 2019-07-22 ENCOUNTER — Encounter: Payer: Self-pay | Admitting: Internal Medicine

## 2019-07-22 ENCOUNTER — Ambulatory Visit (INDEPENDENT_AMBULATORY_CARE_PROVIDER_SITE_OTHER): Payer: Medicare Other | Admitting: Internal Medicine

## 2019-07-22 VITALS — BP 122/72 | HR 85 | Ht 59.5 in | Wt 161.4 lb

## 2019-07-22 DIAGNOSIS — W540XXD Bitten by dog, subsequent encounter: Secondary | ICD-10-CM

## 2019-07-22 DIAGNOSIS — K5909 Other constipation: Secondary | ICD-10-CM

## 2019-07-22 DIAGNOSIS — K760 Fatty (change of) liver, not elsewhere classified: Secondary | ICD-10-CM

## 2019-07-22 DIAGNOSIS — K219 Gastro-esophageal reflux disease without esophagitis: Secondary | ICD-10-CM | POA: Diagnosis not present

## 2019-07-22 MED ORDER — LINACLOTIDE 290 MCG PO CAPS: ORAL_CAPSULE | ORAL | 10 refills | Status: DC

## 2019-07-22 MED ORDER — PANTOPRAZOLE SODIUM 40 MG PO TBEC: DELAYED_RELEASE_TABLET | ORAL | 10 refills | Status: DC

## 2019-07-22 NOTE — Patient Instructions (Addendum)
You have been scheduled for an appointment with [name], [place] at [facility]. Your appointment is on 07/23/19 at [id number] am. Make certain to bring a list of current medications, including any over the counter medications or vitamins. Also bring your co-pay if you have one as well as your insurance cards. [facility] is located at [address]. Should you need to reschedule your appointment, please contact them at [phone].  Continue pantoprazole 40 mg daily.  Continue Linzess 290 mcg.  Please follow up with Dr [name] in 1 year.  If you are age 69 or older, your body mass index should be between 23-30. Your There is no height or weight on file to calculate BMI. If this is out of the aforementioned range listed, please consider follow up with your Primary Care Provider.  If you are age 65 or younger, your body mass index should be between 19-25. Your There is no height or weight on file to calculate BMI. If this is out of the aformentioned range listed, please consider follow up with your Primary Care Provider.   It was a pleasure to see you today!  Dr. Ulice Dash Pyrtle

## 2019-07-22 NOTE — Progress Notes (Signed)
Subjective:    Patient ID: [name], female    DOB: 1950-03-16, 69 y.o.   MRN: [id number]  HPI Shantee Kampman is a 69 yo female with PMH of GERD, chronic constipation with IBS, hx of colon polyps, fatty liver, gastric sleeve placement in August 2020 who is here for followup.  She is here alone today and was last seen in Jan 2020 by Nicoletta Ba, PA-C.   I saw her for surveillance colonoscopy in Feb 2020.  At colonoscopy on 04/17/2018 she had 6 subcentimeter polyps, 3 of which were tubular adenoma.  The others hyperplastic.  Diverticulosis and hemorrhoids were found.  She reports from a GI perspective she is doing well as long as she remains on Linzess 290 mcg a day.  With this she has normally a loose stool daily with very rare skip days.  No abdominal pain.  Bowel movements feel complete.  If she does not take Linzess she feels constipated.  Pantoprazole is working well to control her reflux.  She had successful gastric sleeve procedure in August 2020 and has lost 60 pounds.  No dysphagia or odynophagia.  She was attacked by her dog recently and seen in the ER on 07/16/2019.  She was started on Augmentin which continues but she has continued swelling and pain at the site.  She tried pain medicines for short period which she has remained scared of due to chronic constipation.  She had COVID-19 pneumonia and was hospitalized at [facility] in December 2020.  She has recovered and been vaccinated.   Review of Systems As per HPI, otherwise negative  Current Medications, Allergies, Past Medical History, Past Surgical History, Family History and Social History were reviewed in [employer] record.     Objective:   Physical Exam BP 122/72 (BP Location: Right Arm, Patient Position: Sitting, Cuff Size: Normal)   Pulse 85   Ht 4' 11.5" (1.511 m)   Wt 161 lb 6 oz (73.2 kg)   SpO2 98%   BMI 32.05 kg/m  Gen: awake, alert, NAD HEENT: anicteric, op clear CV: RRR Pulm: CTA  b/l Abd: soft, NT/ND, +BS throughout Ext: no c/c, on the ventral medial side of the arm just above the antecubital fossa there is a indurated fluctuant mass which is very tender to palpation there is minor surrounding erythema Neuro: nonfocal  CMP     Component Value Date/Time   NA 142 02/12/2019 0619   NA 143 06/10/2013 0844   K 5.0 02/12/2019 0619   K 3.5 06/10/2013 0844   CL 111 02/12/2019 0619   CL 103 06/10/2013 0844   CO2 22 02/12/2019 0619   CO2 33 (H) 06/10/2013 0844   GLUCOSE 135 (H) 02/12/2019 0619   GLUCOSE 104 (H) 06/10/2013 0844   BUN 13 02/12/2019 0619   BUN 9 06/10/2013 0844   CREATININE 0.46 02/12/2019 0619   CREATININE 0.70 06/10/2013 0844   CALCIUM 8.7 (L) 02/12/2019 0619   CALCIUM 8.7 06/10/2013 0844   PROT 6.2 (L) 02/12/2019 0619   PROT 7.5 06/10/2013 0844   ALBUMIN 2.9 (L) 02/12/2019 0619   ALBUMIN 3.8 06/10/2013 0844   AST 16 02/12/2019 0619   AST 15 06/10/2013 0844   ALT 30 02/12/2019 0619   ALT 20 01/07/2017 1043   ALKPHOS 61 02/12/2019 0619   ALKPHOS 86 06/10/2013 0844   BILITOT 0.5 02/12/2019 0619   BILITOT 0.4 06/10/2013 0844   GFRNONAA >60 02/12/2019 0619   GFRNONAA >60 06/10/2013 [id number]  GFRAA >60 02/12/2019 0619   GFRAA >60 06/10/2013 0844       Assessment & Plan:  69 yo female with PMH of GERD, chronic constipation with IBS, hx of colon polyps, fatty liver who is here for followup.   GERD --well-controlled on pantoprazole, continue 40 mg a day  Fatty liver --no evidence of advanced liver disease and now status post gastric sleeve and considerable weight loss her fatty liver has likely improved.  Liver enzymes were normal 6 months ago.  Chronic constipation --Linzess is adequately treating this issue.  Continued Linzess to 90 mcg daily  History of colon polyps --surveillance colonoscopy was performed February 2020, repeat recommended February 2023  Dog bite --there is fluctuance which very likely needs incision and drainage on the  left upper arm.  I am referring her to [facility] for further evaluation and this appointment is scheduled for tomorrow.  Continue Augmentin 875 mg twice daily  Annual follow-up  30 minutes total spent today including patient facing time, coordination of care, reviewing medical history/procedures/pertinent radiology studies, and documentation of the encounter.
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
## 2019-09-02 ENCOUNTER — Telehealth: Payer: Self-pay | Admitting: Internal Medicine

## 2019-09-02 NOTE — Telephone Encounter (Signed)
Offered pt and appt with an APP this Friday, pt states she cannot come that day. Discussed with her she should see her PCP or urgent care until her scheduled appt with Dr. [name]. Pt scheduled to see Dr. [name] 10/21/19@9 :30am. Pt aware of appt and added to cancellation list.

## 2019-09-02 NOTE — Telephone Encounter (Signed)
Pt states she is experiencing abdominal pain, diarrhea, nausea. This started about a week ago and would like to be seen for an appt. Nothing available soon.

## 2019-09-04 ENCOUNTER — Ambulatory Visit: Payer: Medicare Other | Admitting: Physician Assistant

## 2019-10-16 ENCOUNTER — Ambulatory Visit: Payer: Medicare Other | Admitting: Physician Assistant

## 2019-10-17 DIAGNOSIS — A0472 Enterocolitis due to Clostridium difficile, not specified as recurrent: Secondary | ICD-10-CM | POA: Insufficient documentation

## 2019-10-21 ENCOUNTER — Encounter: Payer: Self-pay | Admitting: Internal Medicine

## 2019-10-21 ENCOUNTER — Ambulatory Visit (INDEPENDENT_AMBULATORY_CARE_PROVIDER_SITE_OTHER): Payer: Medicare Other | Admitting: Internal Medicine

## 2019-10-21 VITALS — BP 128/82 | HR 80 | Ht 59.5 in | Wt 155.2 lb

## 2019-10-21 DIAGNOSIS — Z8619 Personal history of other infectious and parasitic diseases: Secondary | ICD-10-CM

## 2019-10-21 DIAGNOSIS — K219 Gastro-esophageal reflux disease without esophagitis: Secondary | ICD-10-CM | POA: Diagnosis not present

## 2019-10-21 DIAGNOSIS — K5909 Other constipation: Secondary | ICD-10-CM

## 2019-10-21 NOTE — Patient Instructions (Signed)
Please purchase the following medications over the counter and take as directed: Florastor 250 mg twice daily x 1 month (please use this in the future at the time of any antibiotic use as well)  Continue Linzess.  Please follow up with Dr [name] in 6-12 months.  If you are age 69 or older, your body mass index should be between 23-30. Your Body mass index is 30.82 kg/m. If this is out of the aforementioned range listed, please consider follow up with your Primary Care Provider.  If you are age 21 or younger, your body mass index should be between 19-25. Your Body mass index is 30.82 kg/m. If this is out of the aformentioned range listed, please consider follow up with your Primary Care Provider.   Due to recent changes in healthcare laws, you may see the results of your imaging and laboratory studies on MyChart before your provider has had a chance to review them.  We understand that in some cases there may be results that are confusing or concerning to you. Not all laboratory results come back in the same time frame and the provider may be waiting for multiple results in order to interpret others.  Please give [place] 48 hours in order for your provider to thoroughly review all the results before contacting the office for clarification of your results.

## 2019-10-21 NOTE — Progress Notes (Signed)
Subjective:    Patient ID: [name], female    DOB: 1950/07/06, 69 y.o.   MRN: [id number]  HPI [name] is a 69 year old female with recent C. difficile colitis associated with antibiotic use, history of GERD, chronic constipation with IBS, history of colon polyps, prior fatty liver before gastric sleeve placement in August 2020 who is here for follow-up.  I last saw her on 07/22/2019.  She is here alone today.  Shortly after being seen by me she developed diarrhea.  She had been treated for a significant dog bite on her left arm.  This required multiple rounds of antibiotics, initially treated with Augmentin.  She was seen by surgery but never needed incision and drainage.  After being treated with a second course of antibiotics in late June she developed lower abdominal pain, bloating and diarrhea.  Worse with eating.  Got to the point that she was scared to eat.  Went on 4 to 6 weeks.  Associated with nausea and regurgitation symptom.  She did not need her Linzess at that time.  She was seen by primary care on 09/29/2019 and on 10/02/2019 her stool studies were positive for C. difficile.  Her white count was also slightly elevated at 12.3 at that time.  She was treated with metronidazole 3 times daily x10 days.  She reports other than a bad taste in her mouth this resolved her abdominal pain and diarrhea.  She has since resumed her Linzess 290 mcg daily and she is having regular bowel movements.  No further abdominal pain.  She was treated even after metronidazole with Macrobid which she finished yesterday for UTI.   Review of Systems As per HPI, otherwise negative  Current Medications, Allergies, Past Medical History, Past Surgical History, Family History and Social History were reviewed in [employer] record.     Objective:   Physical Exam BP 128/82 (BP Location: Left Arm, Patient Position: Sitting, Cuff Size: Normal)   Pulse 80   Ht 4' 11.5" (1.511 m)    Wt 155 lb 3.2 oz (70.4 kg)   SpO2 96%   BMI 30.82 kg/m  Gen: awake, alert, NAD HEENT: anicteric CV: RRR, no mrg Pulm: CTA b/l Abd: soft, mild tender in the lower abdomen without rebound or guarding, nondistended, +BS throughout Ext: no c/c/e Neuro: nonfocal  Labs reviewed by care everywhere from [place]:   69 year old female with recent C. difficile colitis associated with antibiotic use, history of GERD, chronic constipation with IBS, history of colon polyps, prior fatty liver before gastric sleeve placement in August 2020 who is here for follow-up.   1.  Resolved C. difficile colitis --antibiotic associated in the setting of the dog bite.  We discussed C. difficile today together and she needs to contact me if she has any recurrent diarrhea or lower abdominal pain.  Should this recur then we would check C. difficile PCR and use vancomycin for treatment.  I recommended probiotic for the next month and then repeated with any future antibiotic use. --Florastor 250 mg twice daily x1 month, repeat with any future antibiotic therapy --Contact me if she develops recurrent diarrhea, lower abdominal pain  2.  GERD --well-controlled on pantoprazole, we will continue 40 mg a day.  Should she develop recurrent C. difficile then we would need to consider changing to H2 blocker  3.  IBS with chronic constipation --Linzess has been resumed and bowel movements have now normalized.  Continue Linzess 290 mcg daily  4.  Fatty liver --no evidence for advanced liver disease now status post gastric sleeve with improvement in fatty liver  5.  History of colon polyps --surveillance colonoscopy recommended February 2023  She can follow-up in 6 to 12 months, sooner if needed

## 2019-12-16 ENCOUNTER — Ambulatory Visit: Payer: Medicare Other | Attending: Neurology

## 2019-12-16 DIAGNOSIS — G4733 Obstructive sleep apnea (adult) (pediatric): Secondary | ICD-10-CM | POA: Insufficient documentation

## 2019-12-17 ENCOUNTER — Other Ambulatory Visit: Payer: Self-pay

## 2020-02-04 ENCOUNTER — Emergency Department
Admission: EM | Admit: 2020-02-04 | Discharge: 2020-02-04 | Disposition: A | Discharge status: Home / Self Care | Payer: Medicare Other | Attending: Student in an Organized Health Care Education/Training Program | Admitting: Student in an Organized Health Care Education/Training Program

## 2020-02-04 ENCOUNTER — Emergency Department: Payer: Medicare Other

## 2020-02-04 ENCOUNTER — Other Ambulatory Visit: Payer: Self-pay

## 2020-02-04 ENCOUNTER — Encounter: Payer: Self-pay | Admitting: *Deleted

## 2020-02-04 DIAGNOSIS — R109 Unspecified abdominal pain: Secondary | ICD-10-CM | POA: Insufficient documentation

## 2020-02-04 DIAGNOSIS — S52532A Colles' fracture of left radius, initial encounter for closed fracture: Secondary | ICD-10-CM | POA: Insufficient documentation

## 2020-02-04 DIAGNOSIS — Z79899 Other long term (current) drug therapy: Secondary | ICD-10-CM | POA: Insufficient documentation

## 2020-02-04 DIAGNOSIS — Z8616 Personal history of COVID-19: Secondary | ICD-10-CM | POA: Diagnosis not present

## 2020-02-04 DIAGNOSIS — J45909 Unspecified asthma, uncomplicated: Secondary | ICD-10-CM | POA: Insufficient documentation

## 2020-02-04 DIAGNOSIS — T1490XA Injury, unspecified, initial encounter: Secondary | ICD-10-CM

## 2020-02-04 DIAGNOSIS — Z7951 Long term (current) use of inhaled steroids: Secondary | ICD-10-CM | POA: Diagnosis not present

## 2020-02-04 DIAGNOSIS — S6992XA Unspecified injury of left wrist, hand and finger(s), initial encounter: Secondary | ICD-10-CM | POA: Diagnosis present

## 2020-02-04 DIAGNOSIS — J449 Chronic obstructive pulmonary disease, unspecified: Secondary | ICD-10-CM | POA: Insufficient documentation

## 2020-02-04 DIAGNOSIS — Z91040 Latex allergy status: Secondary | ICD-10-CM | POA: Insufficient documentation

## 2020-02-04 DIAGNOSIS — I10 Essential (primary) hypertension: Secondary | ICD-10-CM | POA: Diagnosis not present

## 2020-02-04 DIAGNOSIS — W1839XA Other fall on same level, initial encounter: Secondary | ICD-10-CM | POA: Insufficient documentation

## 2020-02-04 DIAGNOSIS — E039 Hypothyroidism, unspecified: Secondary | ICD-10-CM | POA: Diagnosis not present

## 2020-02-04 DIAGNOSIS — M25539 Pain in unspecified wrist: Secondary | ICD-10-CM

## 2020-02-04 LAB — CBC WITH DIFFERENTIAL/PLATELET
Abs Immature Granulocytes: 0.02 10*3/uL (ref 0.00–0.07)
Basophils Absolute: 0 10*3/uL (ref 0.0–0.1)
Basophils Relative: 0 %
Eosinophils Absolute: 0.3 10*3/uL (ref 0.0–0.5)
Eosinophils Relative: 4 %
HCT: 37 % (ref 36.0–46.0)
Hemoglobin: 13 g/dL (ref 12.0–15.0)
Immature Granulocytes: 0 %
Lymphocytes Relative: 18 %
Lymphs Abs: 1.5 10*3/uL (ref 0.7–4.0)
MCH: 30.5 pg (ref 26.0–34.0)
MCHC: 35.1 g/dL (ref 30.0–36.0)
MCV: 86.9 fL (ref 80.0–100.0)
Monocytes Absolute: 0.4 10*3/uL (ref 0.1–1.0)
Monocytes Relative: 5 %
Neutro Abs: 5.8 10*3/uL (ref 1.7–7.7)
Neutrophils Relative %: 73 %
Platelets: 164 10*3/uL (ref 150–400)
RBC: 4.26 MIL/uL (ref 3.87–5.11)
RDW: 12.8 % (ref 11.5–15.5)
WBC: 8 10*3/uL (ref 4.0–10.5)
nRBC: 0 % (ref 0.0–0.2)

## 2020-02-04 LAB — COMPREHENSIVE METABOLIC PANEL
ALT: 17 U/L (ref 0–44)
AST: 22 U/L (ref 15–41)
Albumin: 3.9 g/dL (ref 3.5–5.0)
Alkaline Phosphatase: 70 U/L (ref 38–126)
Anion gap: 9 (ref 5–15)
BUN: 15 mg/dL (ref 8–23)
CO2: 31 mmol/L (ref 22–32)
Calcium: 9 mg/dL (ref 8.9–10.3)
Chloride: 102 mmol/L (ref 98–111)
Creatinine, Ser: 0.65 mg/dL (ref 0.44–1.00)
GFR, Estimated: >60 mL/min (ref >60)
Glucose, Bld: 96 mg/dL (ref 70–99)
Potassium: 3.3 mmol/L — ABNORMAL LOW (ref 3.5–5.1)
Sodium: 142 mmol/L (ref 135–145)
Total Bilirubin: 0.9 mg/dL (ref 0.3–1.2)
Total Protein: 6.7 g/dL (ref 6.5–8.1)

## 2020-02-04 MED ORDER — IOHEXOL 300 MG/ML  SOLN
100.0000 mL | Freq: Once | INTRAMUSCULAR | Status: AC | PRN
  Administered 2020-02-04: 100 mL via INTRAVENOUS
  Filled 2020-02-04: qty 100

## 2020-02-04 MED ORDER — BUPIVACAINE HCL (PF) 0.5 % IJ SOLN
30.0000 mL | Freq: Once | INTRAMUSCULAR | Status: AC
  Administered 2020-02-04: 20 mL
  Filled 2020-02-04: qty 30

## 2020-02-04 MED ORDER — OXYCODONE HCL 5 MG PO TABS: 5.0000 mg | ORAL_TABLET | Freq: Four times a day (QID) | ORAL | 0 refills | Status: DC | PRN

## 2020-02-04 MED ORDER — ONDANSETRON HCL 4 MG/2ML IJ SOLN
4.0000 mg | Freq: Once | INTRAMUSCULAR | Status: AC
  Administered 2020-02-04: 4 mg via INTRAVENOUS
  Filled 2020-02-04: qty 2

## 2020-02-04 MED ORDER — FENTANYL CITRATE (PF) 100 MCG/2ML IJ SOLN
50.0000 ug | INTRAMUSCULAR | Status: DC | PRN
  Administered 2020-02-04: 50 ug via INTRAVENOUS
  Filled 2020-02-04: qty 2

## 2020-02-04 MED ORDER — HYDROMORPHONE HCL 1 MG/ML IJ SOLN
0.5000 mg | INTRAMUSCULAR | Status: DC | PRN
  Administered 2020-02-04: 0.5 mg via INTRAVENOUS
  Filled 2020-02-04: qty 1

## 2020-02-04 MED ORDER — OXYCODONE HCL 5 MG PO TABS
5.0000 mg | ORAL_TABLET | ORAL | Status: DC | PRN
  Administered 2020-02-04: 5 mg via ORAL
  Filled 2020-02-04: qty 1

## 2020-02-04 MED ORDER — MORPHINE SULFATE (PF) 4 MG/ML IV SOLN
4.0000 mg | INTRAVENOUS | Status: DC | PRN
  Administered 2020-02-04: 4 mg via INTRAVENOUS
  Filled 2020-02-04: qty 1

## 2020-02-04 NOTE — ED Notes (Signed)
Radiology at bedside

## 2020-02-04 NOTE — ED Notes (Signed)
Pt could not tolerate ice or immobilization, she is holding her injured wrist up

## 2020-02-04 NOTE — ED Notes (Signed)
EDP, Robinson at bedside to reduce left wrist.

## 2020-02-04 NOTE — ED Triage Notes (Signed)
Pt fell pta and she has an obvious deformity of her left wrist and is in severe pain, numbness in her fingers also

## 2020-02-04 NOTE — ED Notes (Signed)
EDP, [name] at bedside.  Splint placed at this time.

## 2020-02-04 NOTE — Discharge Instructions (Addendum)
Do not have anything to eat after 5AM as Dr. Rudene Christians plans to perform surgery to fix your wrist tomorrow.
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
## 2020-02-05 ENCOUNTER — Encounter
Admission: AD | Disposition: A | Discharge status: Home / Self Care | Payer: Self-pay | Source: Ambulatory Visit | Attending: Orthopedic Surgery

## 2020-02-05 ENCOUNTER — Encounter: Payer: Self-pay | Admitting: Orthopedic Surgery

## 2020-02-05 ENCOUNTER — Ambulatory Visit: Payer: Medicare Other | Admitting: Anesthesiology

## 2020-02-05 ENCOUNTER — Ambulatory Visit
Admission: AD | Admit: 2020-02-05 | Discharge: 2020-02-05 | Disposition: A | Discharge status: Home / Self Care | Payer: Medicare Other | Source: Ambulatory Visit | Attending: Orthopedic Surgery | Admitting: Orthopedic Surgery

## 2020-02-05 ENCOUNTER — Other Ambulatory Visit
Admission: RE | Admit: 2020-02-05 | Discharge: 2020-02-05 | Disposition: A | Discharge status: Home / Self Care | Payer: Medicare Other | Source: Ambulatory Visit | Attending: Orthopedic Surgery | Admitting: Orthopedic Surgery

## 2020-02-05 ENCOUNTER — Other Ambulatory Visit: Payer: Self-pay | Admitting: Orthopedic Surgery

## 2020-02-05 ENCOUNTER — Ambulatory Visit: Payer: Medicare Other

## 2020-02-05 ENCOUNTER — Other Ambulatory Visit: Payer: Self-pay

## 2020-02-05 DIAGNOSIS — Z79899 Other long term (current) drug therapy: Secondary | ICD-10-CM | POA: Diagnosis not present

## 2020-02-05 DIAGNOSIS — Z79890 Hormone replacement therapy (postmenopausal): Secondary | ICD-10-CM | POA: Diagnosis not present

## 2020-02-05 DIAGNOSIS — Z419 Encounter for procedure for purposes other than remedying health state, unspecified: Secondary | ICD-10-CM

## 2020-02-05 DIAGNOSIS — Z20822 Contact with and (suspected) exposure to covid-19: Secondary | ICD-10-CM | POA: Diagnosis not present

## 2020-02-05 DIAGNOSIS — W19XXXA Unspecified fall, initial encounter: Secondary | ICD-10-CM | POA: Diagnosis not present

## 2020-02-05 DIAGNOSIS — S52552A Other extraarticular fracture of lower end of left radius, initial encounter for closed fracture: Secondary | ICD-10-CM | POA: Diagnosis not present

## 2020-02-05 DIAGNOSIS — Z8781 Personal history of (healed) traumatic fracture: Secondary | ICD-10-CM

## 2020-02-05 DIAGNOSIS — S52252A Displaced comminuted fracture of shaft of ulna, left arm, initial encounter for closed fracture: Secondary | ICD-10-CM | POA: Insufficient documentation

## 2020-02-05 DIAGNOSIS — G5602 Carpal tunnel syndrome, left upper limb: Secondary | ICD-10-CM | POA: Diagnosis not present

## 2020-02-05 HISTORY — PX: CARPAL TUNNEL RELEASE: SHX101

## 2020-02-05 HISTORY — PX: ORIF WRIST FRACTURE: SHX2133

## 2020-02-05 LAB — SARS CORONAVIRUS 2 BY RT PCR (HOSPITAL ORDER, PERFORMED IN ~~LOC~~ HOSPITAL LAB): SARS Coronavirus 2: NEGATIVE

## 2020-02-05 SURGERY — OPEN REDUCTION INTERNAL FIXATION (ORIF) WRIST FRACTURE
Anesthesia: General | Site: Wrist | Laterality: Left

## 2020-02-05 MED ORDER — KETOROLAC TROMETHAMINE 10 MG PO TABS
10.0000 mg | ORAL_TABLET | Freq: Four times a day (QID) | ORAL | 0 refills | Status: DC | PRN
Start: 2020-02-05 — End: 2020-09-30

## 2020-02-05 MED ORDER — HYDROMORPHONE HCL 1 MG/ML IJ SOLN
0.2500 mg | INTRAMUSCULAR | Status: DC | PRN
Start: 2020-02-05 — End: 2020-02-06
  Administered 2020-02-05: 0.25 mg via INTRAVENOUS

## 2020-02-05 MED ORDER — SODIUM CHLORIDE 0.9 % IV SOLN: INTRAVENOUS | Status: DC

## 2020-02-05 MED ORDER — ONDANSETRON HCL 4 MG/2ML IJ SOLN: 4.0000 mg | Freq: Four times a day (QID) | INTRAMUSCULAR | Status: DC | PRN

## 2020-02-05 MED ORDER — LIDOCAINE HCL (CARDIAC) PF 100 MG/5ML IV SOSY
PREFILLED_SYRINGE | INTRAVENOUS | Status: DC | PRN
  Administered 2020-02-05: 50 mg via INTRAVENOUS

## 2020-02-05 MED ORDER — CHLORHEXIDINE GLUCONATE 0.12 % MT SOLN
OROMUCOSAL | Status: AC
  Administered 2020-02-05: 15 mL via OROMUCOSAL
  Filled 2020-02-05: qty 15

## 2020-02-05 MED ORDER — PHENYLEPHRINE HCL (PRESSORS) 10 MG/ML IV SOLN
INTRAVENOUS | Status: DC | PRN
  Administered 2020-02-05 (×2): 100 ug via INTRAVENOUS

## 2020-02-05 MED ORDER — MIDAZOLAM HCL 2 MG/2ML IJ SOLN
INTRAMUSCULAR | Status: AC
  Filled 2020-02-05: qty 2

## 2020-02-05 MED ORDER — ONDANSETRON HCL 4 MG/2ML IJ SOLN
INTRAMUSCULAR | Status: DC | PRN
  Administered 2020-02-05: 4 mg via INTRAVENOUS

## 2020-02-05 MED ORDER — DEXAMETHASONE SODIUM PHOSPHATE 10 MG/ML IJ SOLN
INTRAMUSCULAR | Status: DC | PRN
  Administered 2020-02-05: 10 mg via INTRAVENOUS

## 2020-02-05 MED ORDER — SUCCINYLCHOLINE CHLORIDE 20 MG/ML IJ SOLN
INTRAMUSCULAR | Status: DC | PRN
  Administered 2020-02-05: 100 mg via INTRAVENOUS

## 2020-02-05 MED ORDER — ORAL CARE MOUTH RINSE: 15.0000 mL | Freq: Once | OROMUCOSAL | Status: AC

## 2020-02-05 MED ORDER — BUPIVACAINE HCL 0.5 % IJ SOLN
INTRAMUSCULAR | Status: DC | PRN
  Administered 2020-02-05: 20 mL

## 2020-02-05 MED ORDER — PROMETHAZINE HCL 25 MG/ML IJ SOLN: 6.2500 mg | INTRAMUSCULAR | Status: DC | PRN

## 2020-02-05 MED ORDER — DEXAMETHASONE SODIUM PHOSPHATE 10 MG/ML IJ SOLN
INTRAMUSCULAR | Status: AC
  Filled 2020-02-05: qty 1

## 2020-02-05 MED ORDER — OXYCODONE HCL 5 MG PO TABS
ORAL_TABLET | ORAL | Status: AC
  Filled 2020-02-05: qty 1

## 2020-02-05 MED ORDER — ONDANSETRON HCL 4 MG/2ML IJ SOLN: 4.0000 mg | Freq: Once | INTRAMUSCULAR | Status: DC | PRN

## 2020-02-05 MED ORDER — CEFAZOLIN SODIUM-DEXTROSE 2-4 GM/100ML-% IV SOLN
INTRAVENOUS | Status: AC
  Filled 2020-02-05: qty 100

## 2020-02-05 MED ORDER — FENTANYL CITRATE (PF) 100 MCG/2ML IJ SOLN
INTRAMUSCULAR | Status: AC
  Administered 2020-02-05: 25 ug via INTRAVENOUS
  Filled 2020-02-05: qty 2

## 2020-02-05 MED ORDER — LACTATED RINGERS IV SOLN: INTRAVENOUS | Status: DC

## 2020-02-05 MED ORDER — SUCCINYLCHOLINE CHLORIDE 200 MG/10ML IV SOSY
PREFILLED_SYRINGE | INTRAVENOUS | Status: AC
  Filled 2020-02-05: qty 10

## 2020-02-05 MED ORDER — LIDOCAINE HCL (PF) 2 % IJ SOLN
INTRAMUSCULAR | Status: AC
  Filled 2020-02-05: qty 5

## 2020-02-05 MED ORDER — PHENYLEPHRINE HCL (PRESSORS) 10 MG/ML IV SOLN
INTRAVENOUS | Status: AC
  Filled 2020-02-05: qty 1

## 2020-02-05 MED ORDER — BUPIVACAINE HCL (PF) 0.5 % IJ SOLN
INTRAMUSCULAR | Status: AC
  Filled 2020-02-05: qty 10

## 2020-02-05 MED ORDER — BUPIVACAINE HCL (PF) 0.5 % IJ SOLN
INTRAMUSCULAR | Status: DC | PRN
  Administered 2020-02-05: 20 mL

## 2020-02-05 MED ORDER — HYDROMORPHONE HCL 1 MG/ML IJ SOLN
INTRAMUSCULAR | Status: AC
  Administered 2020-02-05: 0.25 mg via INTRAVENOUS
  Filled 2020-02-05: qty 1

## 2020-02-05 MED ORDER — ONDANSETRON HCL 4 MG/2ML IJ SOLN
INTRAMUSCULAR | Status: AC
  Filled 2020-02-05: qty 2

## 2020-02-05 MED ORDER — NEOMYCIN-POLYMYXIN B GU 40-200000 IR SOLN
Status: DC | PRN
  Administered 2020-02-05: 2 mL

## 2020-02-05 MED ORDER — ACETAMINOPHEN 10 MG/ML IV SOLN
INTRAVENOUS | Status: DC | PRN
  Administered 2020-02-05: 1000 mg via INTRAVENOUS

## 2020-02-05 MED ORDER — CEFAZOLIN SODIUM-DEXTROSE 2-4 GM/100ML-% IV SOLN
2.0000 g | INTRAVENOUS | Status: AC
  Administered 2020-02-05: 2 g via INTRAVENOUS

## 2020-02-05 MED ORDER — METOCLOPRAMIDE HCL 10 MG PO TABS: 5.0000 mg | ORAL_TABLET | Freq: Three times a day (TID) | ORAL | Status: DC | PRN

## 2020-02-05 MED ORDER — FENTANYL CITRATE (PF) 100 MCG/2ML IJ SOLN
INTRAMUSCULAR | Status: DC | PRN
  Administered 2020-02-05: 50 ug via INTRAVENOUS
  Administered 2020-02-05 (×2): 100 ug via INTRAVENOUS

## 2020-02-05 MED ORDER — FENTANYL CITRATE (PF) 100 MCG/2ML IJ SOLN
25.0000 ug | INTRAMUSCULAR | Status: AC | PRN
  Administered 2020-02-05 (×6): 25 ug via INTRAVENOUS

## 2020-02-05 MED ORDER — PROPOFOL 10 MG/ML IV BOLUS
INTRAVENOUS | Status: AC
  Filled 2020-02-05: qty 20

## 2020-02-05 MED ORDER — ACETAMINOPHEN 10 MG/ML IV SOLN
INTRAVENOUS | Status: AC
  Filled 2020-02-05: qty 100

## 2020-02-05 MED ORDER — PROPOFOL 10 MG/ML IV BOLUS
INTRAVENOUS | Status: DC | PRN
  Administered 2020-02-05: 100 mg via INTRAVENOUS

## 2020-02-05 MED ORDER — PROMETHAZINE HCL 25 MG/ML IJ SOLN
INTRAMUSCULAR | Status: AC
  Administered 2020-02-05: 6.25 mg via INTRAVENOUS
  Filled 2020-02-05: qty 1

## 2020-02-05 MED ORDER — BUPIVACAINE HCL (PF) 0.5 % IJ SOLN
INTRAMUSCULAR | Status: AC
  Filled 2020-02-05: qty 20

## 2020-02-05 MED ORDER — ONDANSETRON HCL 4 MG PO TABS: 4.0000 mg | ORAL_TABLET | Freq: Four times a day (QID) | ORAL | Status: DC | PRN

## 2020-02-05 MED ORDER — OXYCODONE HCL 5 MG PO TABS
5.0000 mg | ORAL_TABLET | Freq: Once | ORAL | Status: AC
  Administered 2020-02-05: 5 mg via ORAL

## 2020-02-05 MED ORDER — MIDAZOLAM HCL 2 MG/2ML IJ SOLN
INTRAMUSCULAR | Status: DC | PRN
  Administered 2020-02-05: 2 mg via INTRAVENOUS

## 2020-02-05 MED ORDER — MIDAZOLAM HCL 2 MG/2ML IJ SOLN: INTRAMUSCULAR | Status: DC | PRN

## 2020-02-05 MED ORDER — FENTANYL CITRATE (PF) 250 MCG/5ML IJ SOLN
INTRAMUSCULAR | Status: AC
  Filled 2020-02-05: qty 5

## 2020-02-05 MED ORDER — METOCLOPRAMIDE HCL 5 MG/ML IJ SOLN: 5.0000 mg | Freq: Three times a day (TID) | INTRAMUSCULAR | Status: DC | PRN

## 2020-02-05 MED ORDER — CHLORHEXIDINE GLUCONATE 0.12 % MT SOLN: 15.0000 mL | Freq: Once | OROMUCOSAL | Status: AC

## 2020-02-05 SURGICAL SUPPLY — 49 items
APL PRP STRL LF DISP 70% ISPRP (MISCELLANEOUS) ×2
BIT DRILL 2 FAST STEP (BIT) ×3 IMPLANT
BIT DRILL 2.5X4 QC (BIT) ×3 IMPLANT
BNDG CMPR STD VLCR NS LF 5.8X4 (GAUZE/BANDAGES/DRESSINGS) ×2
BNDG ELASTIC 3X5.8 VLCR STR LF (GAUZE/BANDAGES/DRESSINGS) ×3 IMPLANT
BNDG ELASTIC 4X5.8 VLCR NS LF (GAUZE/BANDAGES/DRESSINGS) ×3 IMPLANT
BNDG ELASTIC 4X5.8 VLCR STR LF (GAUZE/BANDAGES/DRESSINGS) ×3 IMPLANT
CANISTER SUCT 1200ML W/VALVE (MISCELLANEOUS) ×3 IMPLANT
CHLORAPREP W/TINT 26 (MISCELLANEOUS) ×3 IMPLANT
COVER WAND RF STERILE (DRAPES) ×3 IMPLANT
CUFF TOURN SGL QUICK 18X4 (TOURNIQUET CUFF) IMPLANT
DRAPE FLUOR MINI C-ARM 54X84 (DRAPES) ×3 IMPLANT
ELECT CAUTERY BLADE 6.4 (BLADE) ×3 IMPLANT
ELECT CAUTERY NEEDLE 2.0 MIC (NEEDLE) IMPLANT
ELECT REM PT RETURN 9FT ADLT (ELECTROSURGICAL) ×3
ELECTRODE REM PT RTRN 9FT ADLT (ELECTROSURGICAL) ×2 IMPLANT
GAUZE SPONGE 4X4 12PLY STRL (GAUZE/BANDAGES/DRESSINGS) ×3 IMPLANT
GAUZE XEROFORM 1X8 LF (GAUZE/BANDAGES/DRESSINGS) ×3 IMPLANT
GLOVE SURG SYN 9.0  PF PI (GLOVE) ×3
GLOVE SURG SYN 9.0 PF PI (GLOVE) ×2 IMPLANT
GOWN SRG 2XL LVL 4 RGLN SLV (GOWNS) ×2 IMPLANT
GOWN STRL NON-REIN 2XL LVL4 (GOWNS) ×3
GOWN STRL REUS W/ TWL LRG LVL3 (GOWN DISPOSABLE) ×2 IMPLANT
GOWN STRL REUS W/TWL LRG LVL3 (GOWN DISPOSABLE) ×3
K-WIRE 1.6 (WIRE) ×3
K-WIRE FX5X1.6XNS BN SS (WIRE) ×2
KIT TURNOVER KIT A (KITS) ×3 IMPLANT
KWIRE FX5X1.6XNS BN SS (WIRE) ×2 IMPLANT
MANIFOLD NEPTUNE II (INSTRUMENTS) ×3 IMPLANT
NEEDLE FILTER BLUNT 18X 1/2SAF (NEEDLE) ×1
NEEDLE FILTER BLUNT 18X1 1/2 (NEEDLE) ×2 IMPLANT
NS IRRIG 500ML POUR BTL (IV SOLUTION) ×3 IMPLANT
PACK EXTREMITY ARMC (MISCELLANEOUS) ×3 IMPLANT
PAD CAST CTTN 4X4 STRL (SOFTGOODS) ×2 IMPLANT
PADDING CAST COTTON 4X4 STRL (SOFTGOODS) ×3
PEG SUBCHONDRAL SMOOTH 2.0X14 (Peg) ×3 IMPLANT
PEG SUBCHONDRAL SMOOTH 2.0X16 (Peg) ×6 IMPLANT
PEG SUBCHONDRAL SMOOTH 2.0X18 (Peg) ×6 IMPLANT
PLATE SHORT 57 NRRW LT (Plate) ×3 IMPLANT
SCALPEL PROTECTED #10 DISP (BLADE) ×3 IMPLANT
SCALPEL PROTECTED #15 DISP (BLADE) ×6 IMPLANT
SCREW CORT 3.5X10 LNG (Screw) ×9 IMPLANT
SCREW MULTI DIRECT 18MM (Screw) ×3 IMPLANT
SPLINT CAST 1 STEP 3X12 (MISCELLANEOUS) ×3 IMPLANT
SUT ETHILON 4-0 (SUTURE) ×3
SUT ETHILON 4-0 FS2 18XMFL BLK (SUTURE) ×2
SUT VICRYL 3-0 27IN (SUTURE) ×3 IMPLANT
SUTURE ETHLN 4-0 FS2 18XMF BLK (SUTURE) ×2 IMPLANT
SYR 3ML LL SCALE MARK (SYRINGE) ×3 IMPLANT

## 2020-02-05 NOTE — Progress Notes (Signed)
Pt was placed in chair and have been doing incentive spirometry.  When doing incentive spirometry sats increase but even when left on nasal canula patient will drop sats to upper 70s and low 80s.  sats increase when pt encouraged to breathe and when have pt do incentive spirometry.  Informed dr Bertell Maria of pt pain level and sat problems.  At bedside to speak with pt about block.

## 2020-02-05 NOTE — Addendum Note (Signed)
Addendum  created 02/05/20 2042 by Arita Miss, MD   Clinical Note Signed, Intraprocedure Blocks edited

## 2020-02-05 NOTE — Op Note (Signed)
02/05/2020  5:49 PM  PATIENT:  [name]  69 y.o. female  PRE-OPERATIVE DIAGNOSIS:  Left Wrist Fracture and acute carpal tunnel syndrome  POST-OPERATIVE DIAGNOSIS:  Left Wrist Fracture and acute carpal tunnel syndrome  PROCEDURE:  Procedure(s): OPEN REDUCTION INTERNAL FIXATION (ORIF) WRIST FRACTURE (Left) CARPAL TUNNEL RELEASE (Left)  SURGEON: Laurene Footman, MD  ASSISTANTS: None  ANESTHESIA:   general  EBL:  Total I/O In: 800 [I.V.:800] Out: 3 [Blood:3]  BLOOD ADMINISTERED:none  DRAINS: none   LOCAL MEDICATIONS USED:  MARCAINE     SPECIMEN:  No Specimen  DISPOSITION OF SPECIMEN:  N/A  COUNTS:  YES  TOURNIQUET:   Total Tourniquet Time Documented: Forearm (Left) - 39 minutes Total: Forearm (Left) - 39 minutes   IMPLANTS: Hand innovations left short narrow DVR plate with multiple pegs and 3 10 mm cortical screws  DICTATION: .Dragon Dictation  patient was brought to the operating room and after adequate anesthesia was obtained the left arm was prepped and draped in usual sterile fashion.  After patient identification and timeout procedures were completed tourniquet was raised and fingertrap traction applied at the end of the table.  Volar approach was made centered over the FCR tendon with the tendon sheath incised the tendon retracted radially protect the radial vessels.  Deep fascia was incised and the pronator was identified and elevated off the radial border of the distal and proximal fragments.  A Freer elevator was used to try to manipulate the fragments to get better alignment.  A volar plate was then applied with a distal first technique filling the distal pegs making certain that there is no penetration into the joint after these were placed the plate was brought down to the shaft and volar tilt was restored with near anatomic alignment in AP lateral and oblique views.  After the 3 cortical screws were placed in retightened traction was removed and the fracture  was stable to range of motion.  The carpal tunnel release was then carried out through a portion of her old scar approximately 2 and half centimeters in length going down through the skin and subcutaneous tissue.  Proximally and distally with a hemostat placed to protect underlying structures release was carried out there appeared to be compression at the level of the wrist crease.  The tourniquet is let down and hemostasis checked with electrocautery.  Skin was closed with 4-0 Vicryl subcutaneously and 4-0 nylon for the skin followed by Xeroform 4 x 4's web roll volar splint and Ace wrap  PLAN OF CARE: Discharge to home after PACU  PATIENT DISPOSITION:  PACU - hemodynamically stable.

## 2020-02-05 NOTE — Anesthesia Procedure Notes (Signed)
Procedure Name: Intubation Performed by: Gaynelle Cage, CRNA Pre-anesthesia Checklist: Patient identified, Emergency Drugs available, Suction available and Patient being monitored Patient Re-evaluated:Patient Re-evaluated prior to induction Oxygen Delivery Method: Circle system utilized Preoxygenation: Pre-oxygenation with 100% oxygen Induction Type: IV induction Laryngoscope Size: McGraph and 3 Grade View: Grade I Tube type: Oral Tube size: 7.0 mm Number of attempts: 1 Airway Equipment and Method: Stylet and Oral airway Placement Confirmation: ETT inserted through vocal cords under direct vision,  positive ETCO2 and breath sounds checked- equal and bilateral Secured at: 21 cm Tube secured with: Tape Dental Injury: Teeth and Oropharynx as per pre-operative assessment

## 2020-02-05 NOTE — Discharge Instructions (Addendum)
Keep arm elevated is much as possible through the weekend. Work on finger motion is much as you can trying to make a fist. Pain medicine as directed. Keep dressing clean and dry.  AMBULATORY SURGERY  DISCHARGE INSTRUCTIONS   1) The drugs that you were given will stay in your system until tomorrow so for the next 24 hours you should not:  A) Drive an automobile B) Make any legal decisions C) Drink any alcoholic beverage   2) You may resume regular meals tomorrow.  Today it is better to start with liquids and gradually work up to solid foods.  You may eat anything you prefer, but it is better to start with liquids, then soup and crackers, and gradually work up to solid foods.   3) Please notify your doctor immediately if you have any unusual bleeding, trouble breathing, redness and pain at the surgery site, drainage, fever, or pain not relieved by medication.    4) Additional Instructions:        Please contact your physician with any problems or Same Day Surgery at [phone], Monday through Friday 6 am to 4 pm, or [place] at [facility] number at [phone].

## 2020-02-05 NOTE — Anesthesia Procedure Notes (Addendum)
Anesthesia Regional Block: Axillary brachial plexus block   Pre-Anesthetic Checklist: ,, timeout performed, Correct Patient, Correct Site, Correct Laterality, Correct Procedure, Correct Position, site marked, Risks and benefits discussed,  Surgical consent,  Pre-op evaluation,  At surgeon's request and post-op pain management  Laterality: Left  Prep: chloraprep       Needles:  Injection technique: Single-shot  Needle Type: Echogenic Needle     Needle Length: 4cm  Needle Gauge: 25     Additional Needles:   Narrative:  Start time: 02/05/2020 8:10 PM End time: 02/05/2020 8:15 PM Injection made incrementally with aspirations every 5 mL.  Performed by: Personally  Anesthesiologist: Arita Miss, MD  Additional Notes: Patient with continued severe pain at operative site postoperatively. Spoke to Dr Rudene Christians who was OK with a nerve block. Patient states she has numbness in her 4th and 5th fingers of left hand; relayed this to Dr Rudene Christians who said he injected some local anesthetic himself so it is likely that.  Patient consented for risk and benefits of nerve block including but not limited to: nerve damage, failed block, bleeding and infection.  Patient voiced understanding.  Functioning IV was confirmed and monitors were applied.  Sterile prep,hand hygiene and sterile gloves were used.  Ultrasound guidance utilized. Minimal sedation used for procedure.   No paresthesia endorsed by patient during the procedure.  Negative aspiration and negative test dose prior to incremental administration of local anesthetic. The patient tolerated the procedure well with no immediate complications.

## 2020-02-05 NOTE — Anesthesia Postprocedure Evaluation (Signed)
Anesthesia Post Note  Patient: Kyasia Steuck  Procedure(s) Performed: OPEN REDUCTION INTERNAL FIXATION (ORIF) WRIST FRACTURE (Left Wrist) CARPAL TUNNEL RELEASE (Left Hand)  Patient location during evaluation: PACU Anesthesia Type: General Level of consciousness: awake and alert Pain management: pain level controlled Vital Signs Assessment: post-procedure vital signs reviewed and stable Respiratory status: spontaneous breathing, nonlabored ventilation, respiratory function stable and patient connected to nasal cannula oxygen Cardiovascular status: blood pressure returned to baseline and stable Postop Assessment: no apparent nausea or vomiting Anesthetic complications: no   No complications documented.   Last Vitals:  Vitals:   02/05/20 2030 02/05/20 2038  BP: 121/64   Pulse: 64   Resp: 11   Temp:    SpO2: 98% 94%    Last Pain:  Vitals:   02/05/20 2038  TempSrc:   PainSc: Asleep                 Arita Miss
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
## 2020-02-05 NOTE — H&P (Signed)
Chief Complaint  Patient presents with  . Pre-op Exam  Lt Distal Radius Fracture    History of the Present Illness: [name] is a 69 y.o. female here today.   The patient presents for evaluation of a very comminuted right distal radius fracture yesterday, 02/04/2020, in the emergency room. She initially had numbness. She comes in today for H and P, as this will need ORIF. They did an adequate closed reduction to get nerve function back, but her fracture needs ORIF due to displacement.  The patient states she was getting some Christmas decorations out of a tote, stood up and took a step. Her sock caught on something and she fell. She is still having some numbness to her right upper extremity, as well as some swelling. The patient states she has a hard time moving due to a broken rib, and the pain is getting worse.   She tells me she has had surgery on her left wrist in the past, but she does not remember which doctor. She tells me she was working reached down to pick up some laundry and had severe pain. They removed all the torn cartilage and there was none left, so they shortened the bone. She tells me she had a plate placed in her right wrist in 2016 or 2017. The patient had spine surgery here at [facility]. She tells me she has had carpal tunnel surgery in the past as well. She broke her shoulder in the 1980s.   I have reviewed past medical, surgical, social and family history, and allergies as documented in the EMR.  Past Medical History: History reviewed. No pertinent past medical history.  Past Surgical History: Past Surgical History:  Procedure Laterality Date  . Gastric sleeve  . HYSTERECTOMY  . ORIF DISTAL RADIUS FRACTURE Right   Past Family History: History reviewed. No pertinent family history.  Medications: Current Outpatient Medications Ordered in Epic  Medication Sig Dispense Refill  . albuterol 90 mcg/actuation inhaler Inhale 2 inhalations into the lungs 2 (two) times  daily  . clobetasoL (TEMOVATE) 0.05 % ointment Apply topically  . diclofenac (VOLTAREN) 1 % topical gel Apply topically  . EPINEPHrine (EPIPEN) 0.3 mg/0.3 mL auto-injector as needed  . levothyroxine (SYNTHROID) 100 MCG tablet Take 1 tablet by mouth once daily  . linaCLOtide (LINZESS) 290 mcg capsule Take 1 capsule by mouth once daily  . pantoprazole (PROTONIX) 40 MG DR tablet Take 1 tablet by mouth once daily  . potassium chloride (KLOR-CON M20) 20 MEQ ER tablet Take 2 tablets by mouth once daily  . atorvastatin (LIPITOR) 80 MG tablet Take 1 tablet by mouth once daily  . cetirizine (ZYRTEC) 10 MG tablet Take 1 tablet by mouth once daily  . cyclobenzaprine (FLEXERIL) 10 MG tablet Take 1 tablet by mouth 3 (three) times daily as needed  . cycloSPORINE (RESTASIS) 0.05 % ophthalmic emulsion Place into both eyes  . doxepin (SINEQUAN) 10 MG capsule Take 1 capsule by mouth nightly  . hydroCHLOROthiazide (HYDRODIURIL) 25 MG tablet Take 1 tablet by mouth once daily  . lisinopriL (ZESTRIL) 20 MG tablet Take 1 tablet by mouth once daily  . lubiprostone (AMITIZA) 24 MCG capsule Take 1 capsule by mouth 2 (two) times daily  . magnesium oxide (MAG-OX) 400 mg (241.3 mg magnesium) tablet Take 1 tablet by mouth once daily  . metoprolol tartrate (LOPRESSOR) 100 MG tablet Take 1 tablet by mouth once daily  . nitroGLYcerin (NITROSTAT) 0.4 MG SL tablet Place 0.4 mg under the  tongue as needed  . omeprazole (PRILOSEC) 20 MG DR capsule Take 1 capsule by mouth once daily  . ondansetron (ZOFRAN-ODT) 4 MG disintegrating tablet Take 1 tablet (4 mg total) by mouth every 8 (eight) hours as needed for Nausea for up to 7 days 20 tablet 0  . oxyCODONE (ROXICODONE) 5 MG immediate release tablet Take 1 tablet (5 mg total) by mouth every 4 (four) hours as needed for Pain for up to 5 days 30 tablet 0  . pregabalin (LYRICA) 50 MG capsule Take 1 capsule by mouth once daily  . promethazine (PROMETHEGAN) 12.5 MG suppository Place 1  suppository rectally as needed  . Saccharomyces boulardii (FLORASTOR) 250 mg capsule Take 1 capsule by mouth 2 (two) times daily  . ursodioL (ACTIGALL) 300 mg capsule Take 1 capsule by mouth once daily  . venlafaxine (EFFEXOR-XR) 150 MG XR capsule Take 1 capsule by mouth once daily   No current Epic-ordered facility-administered medications on file.   Allergies: Allergies  Allergen Reactions  . Bee Pollens Itching  . Hydrocodone Nausea and Hallucination  . Latex, Natural Rubber Rash    Body mass index is 31.72 kg/m.  Review of Systems: A comprehensive 14 point ROS was performed, reviewed, and the pertinent orthopaedic findings are documented in the HPI.  Vitals:  02/05/20 0929  BP: 144/86    General Physical Examination:   General/Constitutional: No apparent distress: well-nourished and well developed. Eyes: Pupils equal, round with synchronous movement. Lungs: Clear to auscultation HEENT: Normal, teeth in poor condition.  Vascular: No edema, swelling or tenderness, except as noted in detailed exam. Cardiac: Heart rate and rhythm is regular. Integumentary: No impressive skin lesions present, except as noted in detailed exam. Neuro/Psych: Normal mood and affect, oriented to person, place and time.  Musculoskeletal Examination:   On exam, sensation intact to the right thumb.  Radiographs:  No new imaging studies were obtained or reviewed today.  Assessment: ICD-10-CM  1. Other closed extra-articular fracture of distal end of left radius, initial encounter S52.552A  2. Acute carpal tunnel syndrome of left wrist G56.02   Plan:  The patient has clinical findings of extra-articular distal radius fracture that is comminuted and displaced, as well as acute carpal tunnel syndrome.  We discussed the patient's prior x-ray findings. We will plan for ORIF and carpal tunnel release. Additionally, teeth are in poor condition.  Surgical Risks:  The nature of the condition  and the proposed procedure has been reviewed in detail with the patient. Surgical versus non-surgical options and prognosis for recovery have been reviewed and the inherent risks and benefits of each have been discussed including the risks of infection, bleeding, injury to nerves/blood vessels/tendons, incomplete relief of symptoms, persisting pain and/or stiffness, loss of function, complex regional pain syndrome, failure of the procedure, as appropriate.  Teeth: Normal in poor condition.  Attestation: I, Dawn Royse, am documenting for [employer], MD utilizing [place].

## 2020-02-05 NOTE — Transfer of Care (Signed)
Immediate Anesthesia Transfer of Care Note  Patient: [name]  Procedure(s) Performed: OPEN REDUCTION INTERNAL FIXATION (ORIF) WRIST FRACTURE (Left Wrist) CARPAL TUNNEL RELEASE (Left Hand)  Patient Location: PACU  Anesthesia Type:General  Level of Consciousness: drowsy  Airway & Oxygen Therapy: Patient Spontanous Breathing and Patient connected to face mask oxygen  Post-op Assessment: Report given to RN  Post vital signs: stable  Last Vitals:  Vitals Value Taken Time  BP    Temp    Pulse    Resp    SpO2      Last Pain:  Vitals:   02/05/20 1431  TempSrc: Temporal  PainSc: 7          Complications: No complications documented.

## 2020-02-08 ENCOUNTER — Encounter: Payer: Self-pay | Admitting: Orthopedic Surgery

## 2020-05-20 ENCOUNTER — Other Ambulatory Visit: Payer: Self-pay | Admitting: Internal Medicine

## 2020-05-25 ENCOUNTER — Other Ambulatory Visit: Payer: Self-pay | Admitting: Internal Medicine

## 2020-06-08 ENCOUNTER — Ambulatory Visit
Payer: Medicare Other | Attending: Student in an Organized Health Care Education/Training Program | Admitting: Student in an Organized Health Care Education/Training Program

## 2020-06-08 ENCOUNTER — Other Ambulatory Visit: Payer: Self-pay

## 2020-06-08 ENCOUNTER — Encounter: Payer: Self-pay | Admitting: Student in an Organized Health Care Education/Training Program

## 2020-06-08 VITALS — BP 145/75 | HR 62 | Temp 97.0°F | Resp 22 | Ht 60.0 in | Wt 165.0 lb

## 2020-06-08 DIAGNOSIS — S52501S Unspecified fracture of the lower end of right radius, sequela: Secondary | ICD-10-CM

## 2020-06-08 DIAGNOSIS — M792 Neuralgia and neuritis, unspecified: Secondary | ICD-10-CM | POA: Diagnosis present

## 2020-06-08 DIAGNOSIS — S52501A Unspecified fracture of the lower end of right radius, initial encounter for closed fracture: Secondary | ICD-10-CM | POA: Insufficient documentation

## 2020-06-08 DIAGNOSIS — Z9889 Other specified postprocedural states: Secondary | ICD-10-CM | POA: Diagnosis present

## 2020-06-08 DIAGNOSIS — Z8781 Personal history of (healed) traumatic fracture: Secondary | ICD-10-CM | POA: Diagnosis present

## 2020-06-08 DIAGNOSIS — G5642 Causalgia of left upper limb: Secondary | ICD-10-CM

## 2020-06-08 DIAGNOSIS — G894 Chronic pain syndrome: Secondary | ICD-10-CM | POA: Insufficient documentation

## 2020-06-08 NOTE — Progress Notes (Signed)
Safety precautions to be maintained throughout the outpatient stay will include: orient to surroundings, keep bed in low position, maintain call bell within reach at all times, provide assistance with transfer out of bed and ambulation.  

## 2020-06-08 NOTE — Patient Instructions (Addendum)
Preparing for Procedure with Sedation Instructions: . Oral Intake: Do not eat or drink anything for at least 8 hours prior to your procedure. . Transportation: Public transportation is not allowed. Bring an adult driver. The driver must be physically present in our waiting room before any procedure can be started. Marland Kitchen Physical Assistance: Bring an adult capable of physically assisting you, in the event you need help. . Blood Pressure Medicine: Take your blood pressure medicine with a sip of water the morning of the procedure. . Insulin: Take only  of your normal insulin dose. . Preventing infections: Shower with an antibacterial soap the morning of your procedure. . Build-up your immune system: Take 1000 mg of Vitamin C with every meal (3 times a day) the day prior to your procedure. . Pregnancy: If you are pregnant, call and cancel the procedure. . Sickness: If you have a cold, fever, or any active infections, call and cancel the procedure. . Arrival: You must be in the facility at least 30 minutes prior to your scheduled procedure. . Children: Do not bring children with you. . Dress appropriately: Bring dark clothing that you would not mind if they get stained. . Valuables: Do not bring any jewelry or valuables. Procedure appointments are reserved for interventional treatments only. Marland Kitchen No Prescription Refills. . No medication changes will be discussed during procedure appointments. . No disability issues will be discussed.  Sympathetic Nerve Block A sympathetic nerve block is a procedure to help diagnose or relieve pain. The sympathetic nerves run along the spine and control certain processes in the body that happen without thinking about them, such as sweating. A sympathetic nerve block is done to numb these nerves. The procedure may be done to find out if a person's pain is being caused by damaged sympathetic nerves. It can also be done to relieve pain that is caused by damage to the nerves.  It is typically done to treat long-term (chronic) pain. During this procedure, a health care provider will inject a numbing medicine (anesthetic) into a clump of sympathetic nerves. The injection site will depend on where you have pain: If the pain is in your lower body, the medicine may be injected into nerves in your lower back. If the pain is in your upper body, the medicine may be injected into nerves in your neck. If the pain is in your abdomen, the medicine may be injected into nerves in the middle of your back. Pain that is caused by damaged sympathetic nerves will go away for a period of time after the block. The pain relief usually ends when the medicine wears off, but sometimes it continues for longer. Tell a health care provider about: Any allergies you have. All medicines you are taking, including vitamins, herbs, eye drops, creams, and over-the-counter medicines. Any problems you or family members have had with anesthetic medicines. Any blood disorders you have. Any surgeries you have had. Any medical conditions you have. Whether you are pregnant or may be pregnant. What are the risks? Generally, this is a safe procedure. However, problems may occur, including: Failure of the procedure to relieve your pain. Feeling worse pain than you did before. Bleeding. Allergic reactions to medicines. Infection. Blood vessel damage. Nerve damage. What happens before the procedure? Staying hydrated Follow instructions from your health care provider about hydration, which may include: Up to 2 hours before the procedure - you may continue to drink clear liquids, such as water, clear fruit juice, black coffee, and plain tea.  Eating and drinking restrictions Follow instructions from your health care provider about eating and drinking, which may include: 8 hours before the procedure - stop eating heavy meals or foods, such as meat, fried foods, or fatty foods. 6 hours before the procedure -  stop eating light meals or foods, such as toast or cereal. 6 hours before the procedure - stop drinking milk or drinks that contain milk. 2 hours before the procedure - stop drinking clear liquids. Medicines Ask your health care provider about: Changing or stopping your regular medicines. This is especially important if you are taking diabetes medicines or blood thinners. Taking medicines such as aspirin and ibuprofen. These medicines can thin your blood. Do not take these medicines unless your health care provider tells you to take them. Taking over-the-counter medicines, vitamins, herbs, and supplements. General instructions Plan to have someone take you home from the hospital or clinic. If you will be going home right after the procedure, plan to have someone with you for 24 hours. What happens during the procedure? An IV will be inserted into one of your veins. You will be given one or more of the following: A medicine to help you relax (sedative). A medicine to numb the area (local anesthetic). A needle will be inserted into the numbed area. Your health care provider will use an imaging tool, such as an X-ray or a CT scan, to help put the needle in the right position. The anesthetic for the block will be injected into the nerves. The needle will be removed. The IV will be removed. Small bandages (dressings) may be placed over the area where the needle was put in. The procedure may vary among health care providers and hospitals.   What can I expect after the procedure? Your blood pressure, heart rate, breathing rate, and blood oxygen level will be monitored until you leave the hospital or clinic. If you were given a sedative during the procedure, it can affect you for several hours. Do not drive or operate machinery until your health care provider says that it is safe. For 24-48 hours after the procedure, it is common for the area where the medicine was injected to  be: Red. Sore. Warm. Weak. Numb. If the injection was made in your neck, you may also have: Voice changes. A droopy eyelid. Trouble swallowing. A stuffy nose. Follow these instructions at home: For the first 24 hours after your procedure: Rest. Avoid activities that require a lot of energy. Keep track of the amount of pain relief that you feel and how long it lasts. Do not apply heat near or over the injection sites. Do not take a bath or soak in water--such as in a swimming pool, lake, or hot tub--until your health care provider approves. Take over-the-counter and prescription medicines only as told by your health care provider. If you have trouble swallowing, take small bites when eating and small sips of water when drinking until you are able to swallow normally. Keep all follow-up visits as told by your health care provider. This is important. Contact a health care provider if: You have numbness that lasts longer than 8 hours. You continue to have pain for more than 24 hours after your procedure. You have any of these signs of infection: Worsening pain or swelling around the injection site. Red streaks around the injection site. Fluid or blood coming from the injection site. Warmth coming from the injection site 2 days or more after the procedure. Pus or a  bad smell coming from the injection site. A fever. Get help right away if you: Cannot swallow. Have chest pain. Have trouble breathing. These symptoms may represent a serious problem that is an emergency. Do not wait to see if the symptoms will go away. Get medical help right away. Call your local emergency services (911 in the [place] Do not drive yourself to the hospital. Summary A sympathetic nerve block is a procedure to numb sympathetic nerves. It may be done to check if you have damage to sympathetic nerves or to help with pain that is caused by damage to these nerves. During this procedure, a health care provider  will inject a numbing medicine (anesthetic) into a clump of sympathetic nerves. Plan to have someone take you home from the hospital or clinic. This information is not intended to replace advice given to you by your health care provider. Make sure you discuss any questions you have with your health care provider. Document Revised: 11/28/2018 Document Reviewed: 11/28/2018 Elsevier Patient Education  2021 [place].        Stellate Ganglion Block Patient Information  Description: The stellate ganglion is part of a chain of nerves in the neck that innervate the shoulder, arm, and hand.  This chain of nerves lies beside the windpipe.  By injecting local anesthesia along the stellate ganglion, we block the nerve innervation to the arm and hand on that particular side.  Generally only sensory innervation (touch) is blocked and not motor innervation (strength).  This allows pain-free physical therapy, which is vital to many of the conditions that this nerve block helps.   After numbing the skin with local anesthesia, a small needle is passed to the stellate ganglion and the local anesthesia is again deposited.  The procedure generally takes less than one minute.  Conditions that my be helped with stellate ganglion blockade;  Reflex sympathetic dystrophy (RSD) Postherpetic neuralgia Diabetic neuropathy Pain from nerve lesions or injury Herpes zoster ( shingles Vascular disease Raynaud's disease  Preparation for the injection: Do not eat any solid food or dairy products within 8 hours of your appointment. You may drink clear liquids up to 3 hours before appointment.  Clear liquids include water, black coffee, juice or soda.  No milk or cream please. You may take your regular medication, including pain medications, with a sip of water before you appointment.  Diabetics should hold regular insulin (if taken separately) and take 1/2 normal NPH dose the morning of the procedure.  Carry some  sugar containing items with you to your appointment. A drive must accompany you and be prepared to drive you home after your procedure. Bring all your current medications with you. An IV may be inserted and sedation may be given at the discretion of the physician. A blood pressure cuff, EKG and other monitors will often be applied during the procedure.  Some patients may need to have extra oxygen administered for a short period. You will be asked to provide medical information, including your allergies, prior to the procedure.  We must know immediately if you are taking blood thinners (like Coumadin) or if you are allergic to IV iodine contrast (dye).  Possible side-effects  Bleeding from needle site Blurry vision and droopy eyelid in that eye (temporary) Stuffy nose Temporary shortness of breath Weakness in arm (temporary) Arm may feel warm   Possible complications: All of these are extremely RARE  Seizures Infection Lung puncture Bleeding in the neck Nerve injury  Call if you  experience:  Fever/chills Extreme shortness of breath or inability to swallow Redness, inflammation or drainage from the site Weakness or numbness that last for greater than 24 hours  Please note:  If effective, we may have to do up to 3-5 blocks in a 3-4 week interval with physical therapy (depending on which condition you are being treated for).  After the procedure: You will be observed in the outpatient area and discharged home.  Please consult the discharge instructions given after the procedure for any post-procedure questions.  If you have any questions please call [phone] [facility]    .
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
## 2020-06-14 ENCOUNTER — Other Ambulatory Visit: Payer: Self-pay

## 2020-06-14 ENCOUNTER — Ambulatory Visit: Admission: RE | Admit: 2020-06-14 | Payer: Medicare Other | Source: Ambulatory Visit

## 2020-06-14 ENCOUNTER — Ambulatory Visit: Payer: Medicare Other | Attending: Pain Medicine | Admitting: Pain Medicine

## 2020-06-14 ENCOUNTER — Encounter: Payer: Self-pay | Admitting: Pain Medicine

## 2020-06-14 VITALS — BP 141/80 | HR 48 | Temp 97.2°F | Resp 16 | Ht 60.0 in | Wt 165.0 lb

## 2020-06-14 DIAGNOSIS — G5642 Causalgia of left upper limb: Secondary | ICD-10-CM | POA: Diagnosis not present

## 2020-06-14 DIAGNOSIS — G5602 Carpal tunnel syndrome, left upper limb: Secondary | ICD-10-CM | POA: Diagnosis not present

## 2020-06-14 DIAGNOSIS — G8929 Other chronic pain: Secondary | ICD-10-CM | POA: Diagnosis present

## 2020-06-14 DIAGNOSIS — M79602 Pain in left arm: Secondary | ICD-10-CM | POA: Diagnosis not present

## 2020-06-14 DIAGNOSIS — Z91040 Latex allergy status: Secondary | ICD-10-CM | POA: Diagnosis present

## 2020-06-14 DIAGNOSIS — Z9889 Other specified postprocedural states: Secondary | ICD-10-CM | POA: Diagnosis not present

## 2020-06-14 DIAGNOSIS — K760 Fatty (change of) liver, not elsewhere classified: Secondary | ICD-10-CM | POA: Insufficient documentation

## 2020-06-14 MED ORDER — ROPIVACAINE HCL 2 MG/ML IJ SOLN
INTRAMUSCULAR | Status: AC
  Filled 2020-06-14: qty 10

## 2020-06-14 MED ORDER — LACTATED RINGERS IV SOLN: 1000.0000 mL | Freq: Once | INTRAVENOUS | Status: DC

## 2020-06-14 MED ORDER — FENTANYL CITRATE (PF) 100 MCG/2ML IJ SOLN: 25.0000 ug | INTRAMUSCULAR | Status: DC | PRN

## 2020-06-14 MED ORDER — MIDAZOLAM HCL 5 MG/5ML IJ SOLN
INTRAMUSCULAR | Status: AC
  Filled 2020-06-14: qty 5

## 2020-06-14 MED ORDER — LIDOCAINE-EPINEPHRINE (PF) 1.5 %-1:200000 IJ SOLN
4.5000 mL | Freq: Once | INTRAMUSCULAR | Status: DC
  Filled 2020-06-14: qty 5

## 2020-06-14 MED ORDER — LIDOCAINE HCL 2 % IJ SOLN: 20.0000 mL | Freq: Once | INTRAMUSCULAR | Status: DC

## 2020-06-14 MED ORDER — ROPIVACAINE HCL 2 MG/ML IJ SOLN: 4.5000 mL | Freq: Once | INTRAMUSCULAR | Status: DC

## 2020-06-14 MED ORDER — IOHEXOL 180 MG/ML  SOLN: 10.0000 mL | Freq: Once | INTRAMUSCULAR | Status: DC

## 2020-06-14 MED ORDER — DEXAMETHASONE SODIUM PHOSPHATE 10 MG/ML IJ SOLN: 10.0000 mg | Freq: Once | INTRAMUSCULAR | Status: DC

## 2020-06-14 MED ORDER — LIDOCAINE-EPINEPHRINE (PF) 2 %-1:200000 IJ SOLN
INTRAMUSCULAR | Status: AC
  Filled 2020-06-14: qty 20

## 2020-06-14 MED ORDER — MIDAZOLAM HCL 5 MG/5ML IJ SOLN: 1.0000 mg | INTRAMUSCULAR | Status: DC | PRN

## 2020-06-14 MED ORDER — DEXAMETHASONE SODIUM PHOSPHATE 10 MG/ML IJ SOLN
INTRAMUSCULAR | Status: AC
  Filled 2020-06-14: qty 1

## 2020-06-14 MED ORDER — LIDOCAINE HCL 2 % IJ SOLN
INTRAMUSCULAR | Status: AC
  Filled 2020-06-14: qty 20

## 2020-06-14 MED ORDER — FENTANYL CITRATE (PF) 100 MCG/2ML IJ SOLN
INTRAMUSCULAR | Status: AC
  Filled 2020-06-14: qty 2

## 2020-06-14 NOTE — Progress Notes (Signed)
Safety precautions to be maintained throughout the outpatient stay will include: orient to surroundings, keep bed in low position, maintain call bell within reach at all times, provide assistance with transfer out of bed and ambulation.  

## 2020-06-14 NOTE — Progress Notes (Signed)
PROVIDER NOTE: Information contained herein reflects review and annotations entered in association with encounter. Interpretation of such information and data should be left to medically-trained personnel. Information provided to patient can be located elsewhere in the medical record under "Patient Instructions". Document created using STT-dictation technology, any transcriptional errors that may result from process are unintentional.    Patient: [name]  Service Category: Procedure  Provider: Gaspar Cola, MD  DOB: [birth year]  DOS: 06/14/2020  Location: [facility]  MRN: [id number]  Setting: Ambulatory - outpatient  Referring Provider: [name], MD  Type: Established Patient  Specialty: Interventional Pain Management  PCP: [name], MD   Primary Reason for Visit: Interventional Pain Management Treatment. CC: Wrist Pain (left)  Procedure:          Anesthesia, Analgesia, Anxiolysis:  Type: Diagnostic Stellate Ganglion Block  #1  Laterality: Left-Sided   NOTE: Procedure CANCELLED by patient's request.  Type: None started.  Conscious sedation planned. Indication(s): Analgesia and Anxiety Route: Intravenous (IV) IV Access: Secured Sedation: Sedation held when patient indicated wanting to cancel procedure.  Local Anesthetic: Lidocaine 1-2%  Position: Supine   Indications: 1. 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 Chronic upper extremity pain (Left)   3. History of acute carpal tunnel syndrome (Left)   4. History of carpal tunnel surgery of wrist (Left) (02/05/2020)   5. Latex precautions, history of latex allergy    Pain Score: Pre-procedure: 7 /10  CRPS/RSD Assessment (AMA Diagnostic Criteria)   Subjective criteria:   1). Continuing pain, which is disproportionate to any inciting event. Present   2). Must report at least 1 symptom in 3 of the 4 following categories:  Sensory: Reports of hyperesthesia and/or  allodynia. Present  Vasomotor: Reports of temperature asymmetry and/or skin color changes and/or skin color asymmetry. Absent  Sudomotor/Edema: Reports of edema and/or sweating changes and/or sweating asymmetry. Present  Motor/Trophic: Reports of decreased range of motion and/or motor dysfunction (weakness, tremor, dystonia) and/or trophic changes (hair, nail, skin). Present  Objective criteria:   3). Must display at least 1 sign at time of evaluation in 2 or more of the following categories:  Sensory: Evidence of hyperalgesia (to pinprick) and/or allodynia (to light touch and/or deep somatic pressure and/or joint movement). Present  Vasomotor: Evidence of temperature asymmetry and/or skin color changes and/or asymmetry.      Absent  Sudomotor/Edema: Evidence of edema and/or sweating changes and/or sweating asymmetry. Edema (1). Skin dry or overly moist (1).       2 Points  Motor/Trophic: Evidence of decreased range of motion and/or motor dysfunction (weakness, tremor, dystonia) and/or trophic changes (hair, nail, skin). Joint stiffness and decreased passive motion (1).  1 Point  Radiographic signs: No tests available Absent   4). There is no other diagnosis that better explains the signs and symptoms.   Objective criteria Legend   Number of Points Class  <4 0  > or equal to 4 1  > or equal to 6 2  > or equal to 8 3-4   IASP-proposed revised CRPS clinical diagnostic criteria ([place] Criteria)  Complex regional pain syndrome describes an array of painful conditions that are characterized by a continuing (spontaneous and/or evoked) regional pain that is seemingly disproportionate in time or degree to the usual course of any known trauma or other lesion. The pain is regional (not in a specific nerve territory or dermatome) and usually has a distal predominance of abnormal sensory,  motor, sudomotor, vasomotor, and/or trophic findings. The syndrome shows variable progression over time. To make  the clinical diagnosis, the following criteria must be met: . Continuing pain, which is disproportionate to any inciting event. . Must report at least one symptom in all four of the following categories: o sensory - reports of hyperesthesia and/or allodynia o vasomotor - reports of temperature asymmetry and/or skin color changes and/or skin color asymmetry o sudomotor/edema - reports of edema and/or sweating changes and/or sweating asymmetry o motor/trophic - reports of decreased range of motion and/or motor dysfunction (weakness, tremor, dystonia) and/or trophic changes (hair, nail, skin). . Must display at least one sign at time of evaluation in two or more of the following categories: o sensory - evidence of hyperalgesia (to pinprick) and/or allodynia (to light touch and/or temperature sensation and/or deep somatic pressure and/or joint movement) o vasomotor - evidence of temperature asymmetry (>?1?C) and/or skin color changes and/or asymmetry o sudomotor/edema - evidence of edema and/or sweating changes and/or sweating asymmetry o motor/trophic - evidence of decreased range of motion and/or motor dysfunction (weakness, tremor, dystonia) and/or trophic changes (hair, nail, skin) . There is no other diagnosis that better explains the signs and symptoms.    Pre-op H&P Assessment:  [name] is a 70 y.o. (year old), female patient, seen today for interventional treatment. She  has a past surgical history that includes Knee arthrocentesis (Left); Wrist Arthroplasty (Left); Abdominal hysterectomy; Carpal tunnel release (Bilateral); Cardiac catheterization (06/15/14); Anterior cervical decomp/discectomy fusion (N/A, 10/27/2014); Colonoscopy; Upper gastrointestinal endoscopy; Wound debridement (Right); Tendon repair (Right); Wrist surgery (Right); ORIF wrist fracture (Left, 02/05/2020); and Carpal tunnel release (Left, 02/05/2020). [name] has a current medication list which includes the following  prescription(s): albuterol, levothyroxine, linaclotide, nitroglycerin, oxycodone, pantoprazole, potassium chloride sa, budesonide-formoterol, and ketorolac. Her primarily concern today is the Wrist Pain (left)  Initial Vital Signs:  Pulse/HCG Rate: (!) 48  Temp: (!) 97.2 F (36.2 C) Resp: 16 BP: (!) 141/80 SpO2: 99 %  BMI: Estimated body mass index is 32.22 kg/m as calculated from the following:   Height as of this encounter: 5' (1.524 m).   Weight as of this encounter: 165 lb (74.8 kg).  Risk Assessment: Allergies: Reviewed. She is allergic to bee venom, wasp venom, codeine, hydrocodone-acetaminophen, mango flavor, procaine hcl, tramadol, and latex.  Allergy Precautions: Latex-free protocol activated Coagulopathies: Reviewed. None identified.  Blood-thinner therapy: None at this time Active Infection(s): Reviewed. None identified. Ms. Hinnenkamp is afebrile  Site Confirmation: Ms. Mira was asked to confirm the procedure and laterality before marking the site Procedure checklist: Completed Consent: Before the procedure and under the influence of no sedative(s), amnesic(s), or anxiolytics, the patient was informed of the treatment options, risks and possible complications. To fulfill our ethical and legal obligations, as recommended by the American Medical Association's Code of Ethics, I have informed the patient of my clinical impression; the nature and purpose of the treatment or procedure; the risks, benefits, and possible complications of the intervention; the alternatives, including doing nothing; the risk(s) and benefit(s) of the alternative treatment(s) or procedure(s); and the risk(s) and benefit(s) of doing nothing. The patient was provided information about the general risks and possible complications associated with the procedure. These may include, but are not limited to: failure to achieve desired goals, infection, bleeding, organ or nerve damage, allergic reactions, paralysis,  and death. In addition, the patient was informed of those risks and complications associated to Spine-related procedures, such as failure to decrease pain; infection (  i.e.: Meningitis, epidural or intraspinal abscess); bleeding (i.e.: epidural hematoma, subarachnoid hemorrhage, or any other type of intraspinal or peri-dural bleeding); organ or nerve damage (i.e.: Any type of peripheral nerve, nerve root, or spinal cord injury) with subsequent damage to sensory, motor, and/or autonomic systems, resulting in permanent pain, numbness, and/or weakness of one or several areas of the body; allergic reactions; (i.e.: anaphylactic reaction); and/or death. Furthermore, the patient was informed of those risks and complications associated with the medications. These include, but are not limited to: allergic reactions (i.e.: anaphylactic or anaphylactoid reaction(s)); adrenal axis suppression; blood sugar elevation that in diabetics may result in ketoacidosis or comma; water retention that in patients with history of congestive heart failure may result in shortness of breath, pulmonary edema, and decompensation with resultant heart failure; weight gain; swelling or edema; medication-induced neural toxicity; particulate matter embolism and blood vessel occlusion with resultant organ, and/or nervous system infarction; and/or aseptic necrosis of one or more joints. Finally, the patient was informed that Medicine is not an exact science; therefore, there is also the possibility of unforeseen or unpredictable risks and/or possible complications that may result in a catastrophic outcome.  It was at this point that the patient decided that she did not want to proceed with the interventional therapy.  Date  Time: 06/14/2020  9:03 AM  Plan of Care  Orders:  Orders Placed This Encounter  Procedures  . Informed Consent Details: Physician/Practitioner Attestation; Transcribe to consent form and obtain patient signature     Nursing Order: Transcribe to consent form and obtain patient signature. Note: Always confirm laterality of pain with Ms. Near, before procedure.    Order Specific Question:   Physician/Practitioner attestation of informed consent for procedure/surgical case    Answer:   I, the physician/practitioner, attest that I have discussed with the patient the benefits, risks, side effects, alternatives, likelihood of achieving goals and potential problems during recovery for the procedure that I have provided informed consent.    Order Specific Question:   Procedure    Answer:   Stellate ganglion block fluoroscopic guidance    Order Specific Question:   Physician/Practitioner performing the procedure    Answer:   Leib Elahi A. Dossie Arbour, MD    Order Specific Question:   Indication/Reason    Answer:   Chronic upper extremity pain secondary to CRPS (complex regional pain syndrome)   Chronic Opioid Analgesic:  No opioid analgesics prescribed by our practice.   Medications ordered for procedure: Meds ordered this encounter  Medications  . DISCONTD: iohexol (OMNIPAQUE) 180 MG/ML injection 10 mL    Must be Myelogram-compatible. If not available, you may substitute with a water-soluble, non-ionic, hypoallergenic, myelogram-compatible radiological contrast medium.  . DISCONTD: lidocaine (XYLOCAINE) 2 % (with pres) injection 400 mg  . DISCONTD: lactated ringers infusion 1,000 mL  . DISCONTD: midazolam (VERSED) 5 MG/5ML injection 1-2 mg    Make sure Flumazenil is available in the pyxis when using this medication. If oversedation occurs, administer 0.2 mg IV over 15 sec. If after 45 sec no response, administer 0.2 mg again over 1 min; may repeat at 1 min intervals; not to exceed 4 doses (1 mg)  . DISCONTD: fentaNYL (SUBLIMAZE) injection 25-50 mcg    Make sure Narcan is available in the pyxis when using this medication. In the event of respiratory depression (RR< 8/min): Titrate NARCAN (naloxone) in increments of  0.1 to 0.2 mg IV at 2-3 minute intervals, until desired degree of reversal.  . DISCONTD: lidocaine-EPINEPHrine 1.5 %-  1:200000 injection 4.5 mL  . DISCONTD: ropivacaine (PF) 2 mg/mL (0.2%) (NAROPIN) injection 4.5 mL  . DISCONTD: dexamethasone (DECADRON) injection 10 mg   Medications administered: [name] had no medications administered during this visit.  See the medical record for exact dosing, route, and time of administration.  Follow-up plan:   Return for (F2F) evaluation w/ Dr. Gillis Santa..      Interventional Therapies  Risk  Complexity Considerations:   Estimated body mass index is 32.22 kg/m as calculated from the following:   Height as of this encounter: 5' (1.524 m).   Weight as of this encounter: 165 lb (74.8 kg). Allergy: Codeine, hydrocodone, tramadol, procaine, latex    Planned  Pending:   Diagnostic left stellate glandular block #1 (06/14/2020)    Under consideration:   Consider membrane stabilizer trial with either gabapentin or pregabalin.   Completed:   None at this time   Therapeutic  Palliative (PRN) options:   None established    Recent Visits Date Type Provider Dept  06/08/20 Office Visit Gillis Santa, MD [facility]  Showing recent visits within past 90 days and meeting all other requirements Today's Visits Date Type Provider Dept  06/14/20 Procedure visit Milinda Pointer, MD [facility]  Showing today's visits and meeting all other requirements Future Appointments Date Type Provider Dept  07/26/20 Appointment Gillis Santa, MD [facility]  Showing future appointments within next 90 days and meeting all other requirements  Disposition: Discharge home  Discharge (Date  Time): 06/14/2020; 1000 hrs.   Primary Care Physician: [name], MD Location: [facility] Note by: Gaspar Cola, MD Date: 06/14/2020; Time: 11:44 AM  Disclaimer:  Medicine is not an Armed forces logistics/support/administrative officer. The only guarantee in medicine is that nothing is guaranteed. It is important to note that the decision to proceed with this intervention was based on the information collected from the patient. The Data and conclusions were drawn from the patient's questionnaire, the interview, and the physical examination. Because the information was provided in large part by the patient, it cannot be guaranteed that it has not been purposely or unconsciously manipulated. Every effort has been made to obtain as much relevant data as possible for this evaluation. It is important to note that the conclusions that lead to this procedure are derived in large part from the available data. Always take into account that the treatment will also be dependent on availability of resources and existing treatment guidelines, considered by other Pain Management Practitioners as being common knowledge and practice, at the time of the intervention. For Medico-Legal purposes, it is also important to point out that variation in procedural techniques and pharmacological choices are the acceptable norm. The indications, contraindications, technique, and results of the above procedure should only be interpreted and judged by a Board-Certified Interventional Pain Specialist with extensive familiarity and expertise in the same exact procedure and technique.

## 2020-06-14 NOTE — Progress Notes (Signed)
0940 Dr. Dossie Arbour in to see patient to further explain the procedure and inform patient of her role during the procedure. After a discussion with staff present and listening to patient concerns, the patient has decided not to proceed with the procedure.

## 2020-07-07 ENCOUNTER — Other Ambulatory Visit: Payer: Self-pay | Admitting: Internal Medicine

## 2020-07-08 ENCOUNTER — Telehealth: Payer: Self-pay | Admitting: Internal Medicine

## 2020-07-08 NOTE — Telephone Encounter (Signed)
Looks like patient changed to [facility]? Had endoscopy with them 03/29/20. Is she wanting to come back to Dr [name]?

## 2020-07-08 NOTE — Telephone Encounter (Signed)
Inbound call from patient requesting for Linzess to be in 90 day supply instead of monthly.  Please advise.

## 2020-07-08 NOTE — Telephone Encounter (Signed)
I did ask patient about that.  She stated she had gastric bypass surgery at [facility] and she insisted it was for a follow-up for the surgery.

## 2020-07-08 NOTE — Telephone Encounter (Signed)
Dr [name]- Patient was seen at [facility] for endoscopy 03/29/20. She apparently had gastric bypass with them and was told it was follow up from that. She wants to continue seeing you. Are you okay with continuing seeing her since you can see that endoscopy from 03/29/20 and her intention was not to leave your care?

## 2020-07-11 MED ORDER — LINACLOTIDE 290 MCG PO CAPS: 290.0000 ug | ORAL_CAPSULE | Freq: Every day | ORAL | 0 refills | Status: DC

## 2020-07-11 NOTE — Telephone Encounter (Signed)
Rx sent 

## 2020-07-11 NOTE — Telephone Encounter (Signed)
Certainly ok to return to my practice

## 2020-07-26 ENCOUNTER — Ambulatory Visit
Payer: Medicare Other | Attending: Student in an Organized Health Care Education/Training Program | Admitting: Student in an Organized Health Care Education/Training Program

## 2020-07-26 ENCOUNTER — Encounter: Payer: Self-pay | Admitting: Student in an Organized Health Care Education/Training Program

## 2020-07-26 ENCOUNTER — Other Ambulatory Visit: Payer: Self-pay

## 2020-07-26 VITALS — BP 137/78 | HR 69 | Temp 97.0°F | Resp 18 | Ht 60.0 in | Wt 168.0 lb

## 2020-07-26 DIAGNOSIS — Z9889 Other specified postprocedural states: Secondary | ICD-10-CM

## 2020-07-26 DIAGNOSIS — G5602 Carpal tunnel syndrome, left upper limb: Secondary | ICD-10-CM | POA: Diagnosis not present

## 2020-07-26 DIAGNOSIS — G5642 Causalgia of left upper limb: Secondary | ICD-10-CM | POA: Diagnosis not present

## 2020-07-26 DIAGNOSIS — M79602 Pain in left arm: Secondary | ICD-10-CM | POA: Diagnosis not present

## 2020-07-26 DIAGNOSIS — G8929 Other chronic pain: Secondary | ICD-10-CM | POA: Insufficient documentation

## 2020-07-26 NOTE — Patient Instructions (Signed)
Return as needed

## 2020-07-26 NOTE — Progress Notes (Signed)
PROVIDER NOTE: Information contained herein reflects review and annotations entered in association with encounter. Interpretation of such information and data should be left to medically-trained personnel. Information provided to patient can be located elsewhere in the medical record under "Patient Instructions". Document created using STT-dictation technology, any transcriptional errors that may result from process are unintentional.    Patient: [name]  Service Category: E/M  Provider: Gillis Santa, MD  DOB: October 11, 1950  DOS: 07/26/2020  Specialty: Interventional Pain Management  MRN: [id number]  Setting: Ambulatory outpatient  PCP: [name], MD  Type: Established Patient    Referring Provider: [name], MD  Location: Office  Delivery: Face-to-face     HPI  [name], a 70 y.o. year old female, is here today because of her Chronic pain of left upper extremity [M79.602, G89.29]. [name] primary complain today is Hand Pain (left) Last encounter: My last encounter with her was on 06/08/2020. Pain Assessment: Severity of Chronic pain is reported as a 7 /10. Location: Hand Left/left forearm and upper arm, sometimes left thumb "locks up". Onset: More than a month ago. Quality: Tingling,Sharp (shocking). Timing: Constant. Modifying factor(s): medications, sleep. Vitals:  height is 5' (1.524 m) and weight is 168 lb (76.2 kg). Her temporal temperature is 97 F (36.1 C) (abnormal). Her blood pressure is 137/78 and her pulse is 69. Her respiration is 18 and oxygen saturation is 100%.   Reason for encounter: follow-up evaluation     Patient was unable to tolerate left stellate ganglion nerve block due to increased anxiety even in the context of sedation. She is endorsing left thumb pain and seems to have a trigger finger.  I have recommended she follow-up with orthopedics for this. Patient was referred to [place] for fast-track left stellate ganglion nerve block however due to  increased anxiety patient does not want to try and repeat procedure.  She can follow-up as needed.  Continue follow-up with orthopedics.  ROS  Constitutional: Denies any fever or chills Gastrointestinal: No reported hemesis, hematochezia, vomiting, or acute GI distress Musculoskeletal: Left hand, left thumb pain Neurological: No reported episodes of acute onset apraxia, aphasia, dysarthria, agnosia, amnesia, paralysis, loss of coordination, or loss of consciousness  Medication Review  albuterol, budesonide-formoterol, ketorolac, levothyroxine, linaclotide, nitroGLYCERIN, oxyCODONE, pantoprazole, and potassium chloride SA  History Review  Allergy: [name] is allergic to bee venom, wasp venom, codeine, hydrocodone-acetaminophen, mango flavor, procaine hcl, tramadol, and latex. Drug: [name]  reports no history of drug use. Alcohol:  reports no history of alcohol use. Tobacco:  reports that she has never smoked. She has never used smokeless tobacco. Social: [name]  reports that she has never smoked. She has never used smokeless tobacco. She reports that she does not drink alcohol and does not use drugs. Medical:  has a past medical history of Allergy, Anxiety, Arthritis, Asthma, Back pain, Cataract, COPD (chronic obstructive pulmonary disease) ([place]), COVID-19, Depression, Diverticulosis, Fatty liver, GERD (gastroesophageal reflux disease), Heart murmur, Hemopneumothorax on left (06/20/2015), Hiatal hernia, Hyperlipidemia, Hypertension, Shortness of breath dyspnea, Sleep apnea, Thyroid disease, and Tubular adenoma of colon. Surgical: [name]  has a past surgical history that includes Knee arthrocentesis (Left); Wrist Arthroplasty (Left); Abdominal hysterectomy; Carpal tunnel release (Bilateral); Cardiac catheterization (06/15/14); Anterior cervical decomp/discectomy fusion (N/A, 10/27/2014); Colonoscopy; Upper gastrointestinal endoscopy; Wound debridement (Right); Tendon repair (Right);  Wrist surgery (Right); ORIF wrist fracture (Left, 02/05/2020); and Carpal tunnel release (Left, 02/05/2020). Family: family history includes Colon polyps in her brother; Diabetes in her  maternal aunt, mother, and sister; Esophageal cancer in her maternal grandfather; Heart disease in her father and maternal uncle; Irritable bowel syndrome in her brother.  Laboratory Chemistry Profile   Renal Lab Results  Component Value Date   BUN 15 02/04/2020   CREATININE 0.65 02/04/2020   GFRAA >60 02/12/2019   GFRNONAA >60 02/04/2020     Hepatic Lab Results  Component Value Date   AST 22 02/04/2020   ALT 17 02/04/2020   ALBUMIN 3.9 02/04/2020   ALKPHOS 70 02/04/2020   LIPASE 117 06/10/2013     Electrolytes Lab Results  Component Value Date   NA 142 02/04/2020   K 3.3 (L) 02/04/2020   CL 102 02/04/2020   CALCIUM 9.0 02/04/2020     Bone No results found for: VD25OH, VD125OH2TOT, OI3254DI2, ME1583EN4, 25OHVITD1, 25OHVITD2, 25OHVITD3, TESTOFREE, TESTOSTERONE   Inflammation (CRP: Acute Phase) (ESR: Chronic Phase) Lab Results  Component Value Date   CRP 1.6 (H) 02/12/2019   LATICACIDVEN 1.6 06/21/2015       Note: Above Lab results reviewed.  Recent Imaging Review  [place] OR NERVE BLOCK-IMAGE ONLY [facility]) There is no interpretation for this exam.    This order is for images obtained during a surgical procedure.  Please See  "Surgeries" Tab for more information regarding the procedure. DG Wrist 2 Views Left CLINICAL DATA:  Status post ORIF of the left wrist.  EXAM: LEFT WRIST - 2 VIEW  COMPARISON:  February 04, 2020  FINDINGS: The left wrist was imaged in a fiberglass cast with subsequently obscured osseous and soft tissue detail. Radiopaque fixation plates and screws are seen along the distal aspects of the left radius and left ulna. The fixation plate involving the distal left radius represents a new finding when compared to the prior exam. Gross anatomic alignment of the  underlying radial fracture deformity is noted. There is no evidence of dislocation. Moderate severity diffuse soft tissue swelling is seen.  IMPRESSION: 1. Status post ORIF of the distal left radius and left ulna with gross anatomic alignment of the underlying radial fracture deformity.  Electronically Signed   By: Virgina Norfolk M.D.   On: 02/05/2020 19:28 Note: Reviewed        Physical Exam  General appearance: Well nourished, well developed, and well hydrated. In no apparent acute distress Mental status: Alert, oriented x 3 (person, place, & time)       Respiratory: No evidence of acute respiratory distress Eyes: PERLA Vitals: BP 137/78   Pulse 69   Temp (!) 97 F (36.1 C) (Temporal)   Resp 18   Ht 5' (1.524 m)   Wt 168 lb (76.2 kg)   SpO2 100%   BMI 32.81 kg/m  BMI: Estimated body mass index is 32.81 kg/m as calculated from the following:   Height as of this encounter: 5' (1.524 m).   Weight as of this encounter: 168 lb (76.2 kg). Ideal: Ideal body weight: 45.5 kg (100 lb 4.9 oz) Adjusted ideal body weight: 57.8 kg (127 lb 6.2 oz)   Upper Extremity (UE) Exam    Side: Right upper extremity  Side: Left upper extremity   Skin & Extremity Inspection: Skin color, temperature, and hair growth are WNL. No peripheral edema or cyanosis. No masses, redness, swelling, asymmetry, or associated skin lesions. No contractures.  Skin & Extremity Inspection: Edema, +redness   Functional ROM: Pain restricted ROM for shoulder  Functional ROM: Pain restricted ROM for wrist and hand   Muscle Tone/Strength: Functionally intact.  No obvious neuro-muscular anomalies detected.   Muscle Tone/Strength: Mild thenar atrophy   Sensory (Neurological): Arthropathic arthralgia          Sensory (Neurological): Neurogenic pain pattern           Palpation: No palpable anomalies              Palpation: Complains of area being tender to palpation               Provocative Test(s):  Phalen's  test: deferred Tinel's test: deferred Apley's scratch test (touch opposite shoulder):  Action 1 (Across chest): Decreased ROM Action 2 (Overhead): Decreased ROM Action 3 (LB reach): Decreased ROM   Provocative Test(s):  Phalen's test: deferred Tinel's test: deferred Apley's scratch test (touch opposite shoulder):  Action 1 (Across chest): Decreased ROM Action 2 (Overhead): Decreased ROM Action 3 (LB reach): Decreased ROM       Assessment   Diagnosis  1. Chronic upper extremity pain (Left)   2. Complex regional pain syndrome type 2 of upper extremity (Left)   3. History of acute carpal tunnel syndrome (Left)   4. History of carpal tunnel surgery of wrist (Left) (02/05/2020)       Plan of Care  Continue care with orthopedics, unable to tolerate/declined repeat left stellate ganglion NB   Follow-up plan:   Return if symptoms worsen or fail to improve.   Recent Visits Date Type Provider Dept  06/14/20 Procedure visit Milinda Pointer, MD [facility]  06/08/20 Office Visit Gillis Santa, MD [facility]  Showing recent visits within past 90 days and meeting all other requirements Today's Visits Date Type Provider Dept  07/26/20 Office Visit Gillis Santa, MD [facility]  Showing today's visits and meeting all other requirements Future Appointments No visits were found meeting these conditions. Showing future appointments within next 90 days and meeting all other requirements  I discussed the assessment and treatment plan with the patient. The patient was provided an opportunity to ask questions and all were answered. The patient agreed with the plan and demonstrated an understanding of the instructions.  Patient advised to call back or seek an in-person evaluation if the symptoms or condition worsens.  Duration of encounter: 15 minutes.  Note by: Gillis Santa, MD Date: 07/26/2020; Time: 10:56 AM

## 2020-07-26 NOTE — Progress Notes (Signed)
Safety precautions to be maintained throughout the outpatient stay will include: orient to surroundings, keep bed in low position, maintain call bell within reach at all times, provide assistance with transfer out of bed and ambulation.  

## 2020-08-19 ENCOUNTER — Other Ambulatory Visit: Payer: Self-pay | Admitting: Internal Medicine

## 2020-09-06 IMAGING — DX DG CHEST 1V PORT
1 series · 1 of 1 positions shown · non-contrast
Comparison: 01/17/2019

CLINICAL DATA: Fever and cough for 2 days, recent [id number]-[id number] exposure

EXAM:
PORTABLE CHEST 1 VIEW

[chest ap]
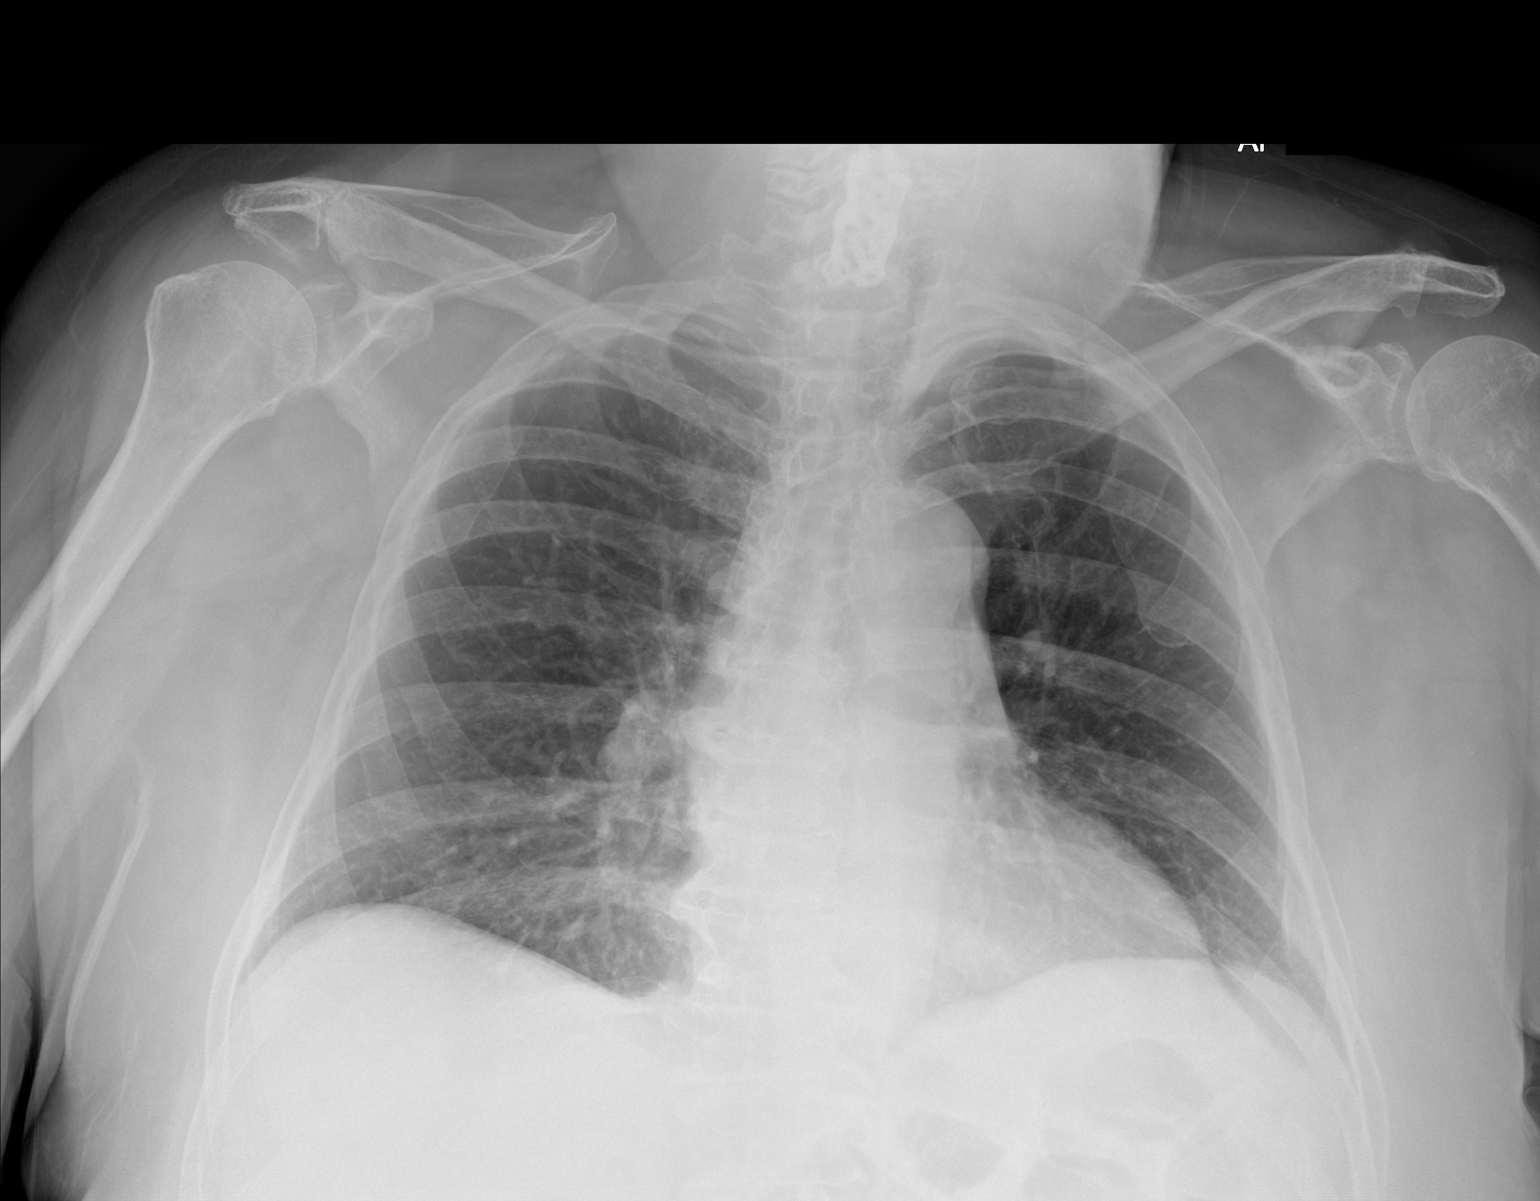

[1 of 1 positions shown; findings below may reference images not displayed]

FINDINGS: The heart size and mediastinal contours are within normal limits.
Both lungs are clear. The visualized skeletal structures show
degenerative change of the thoracic spine. Postsurgical changes are
noted in cervical spine.
IMPRESSION: No active disease.

## 2020-09-18 IMAGING — CR DG CHEST 2V
1 series · 2 of 2 positions shown · non-contrast
Comparison: 01/29/2019

CLINICAL DATA: COVID positive on [DATE]. Shortness of breath. Chest
pain

EXAM:
CHEST - 2 VIEW

[Series 1: dg chest 2 view · 0.14mm/px · 2 of 2 slices shown]
[im 1/2]
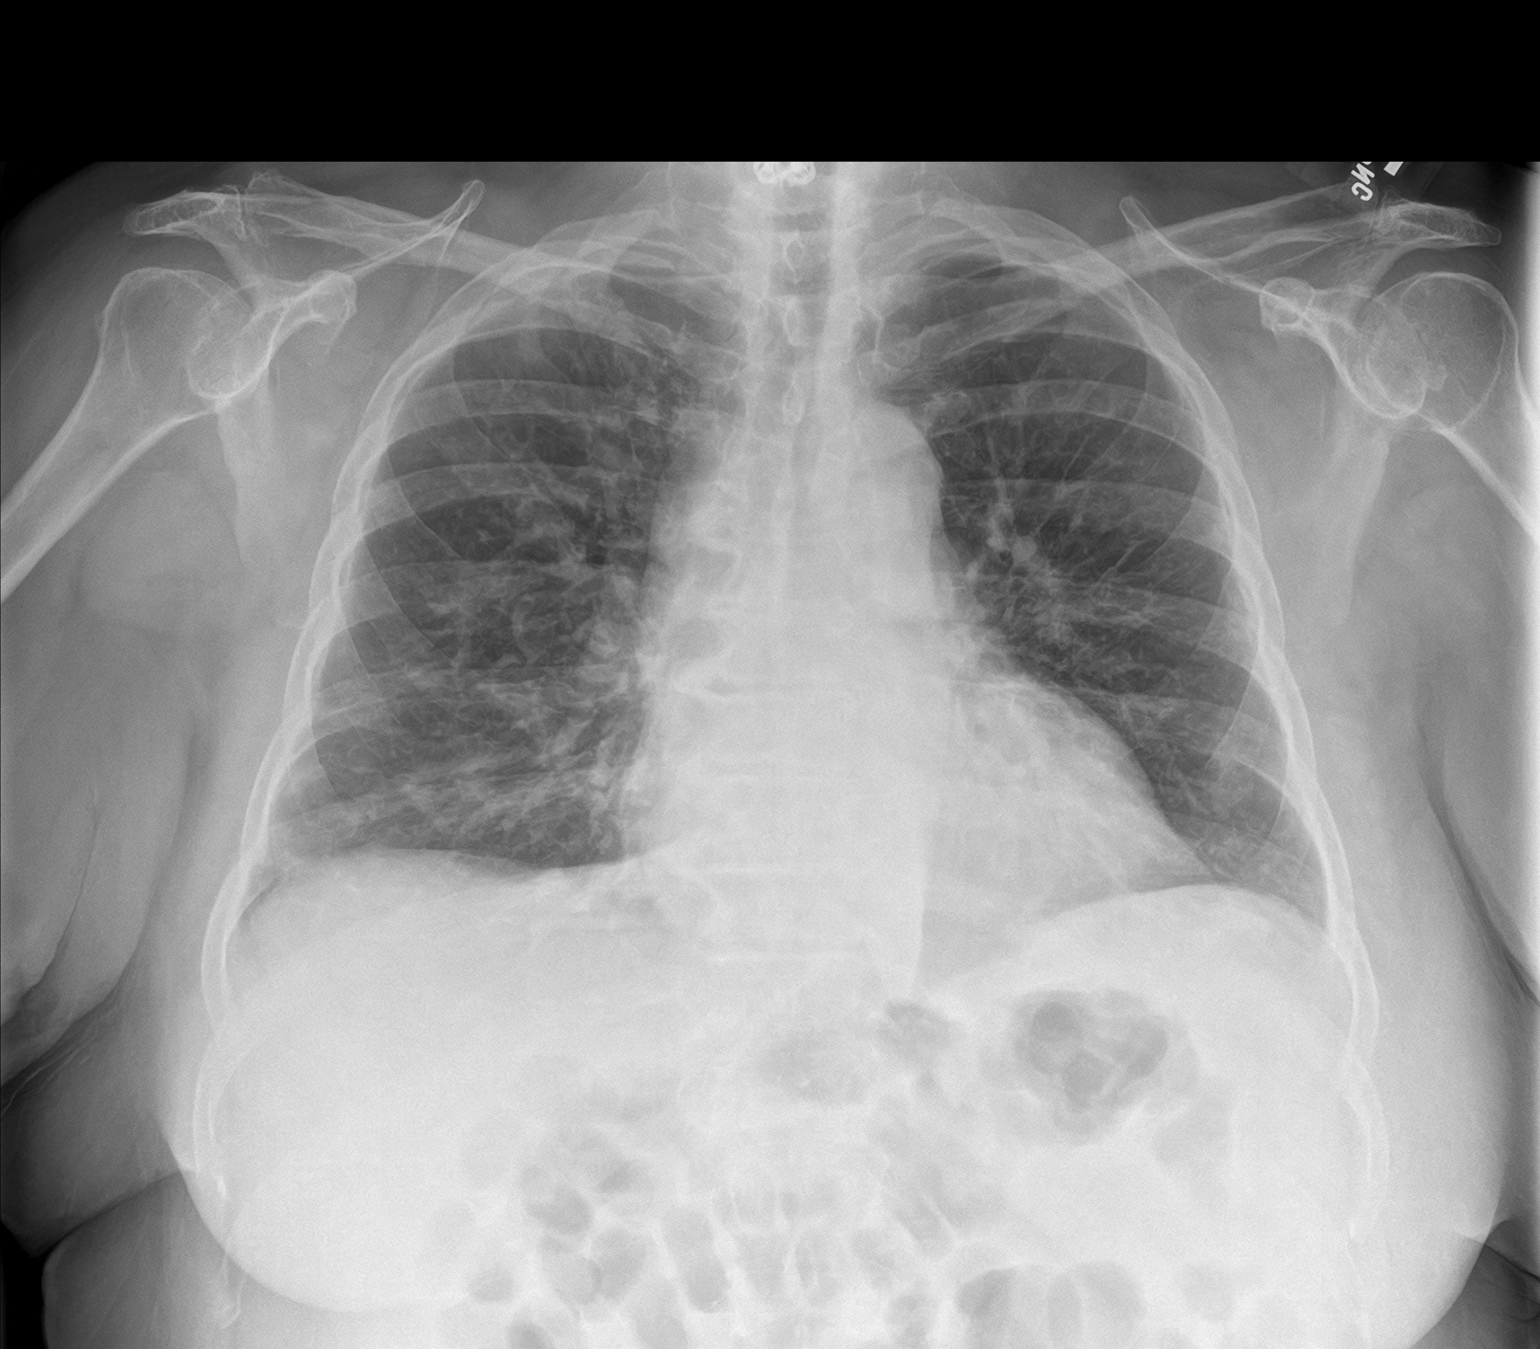
[im 2/2]
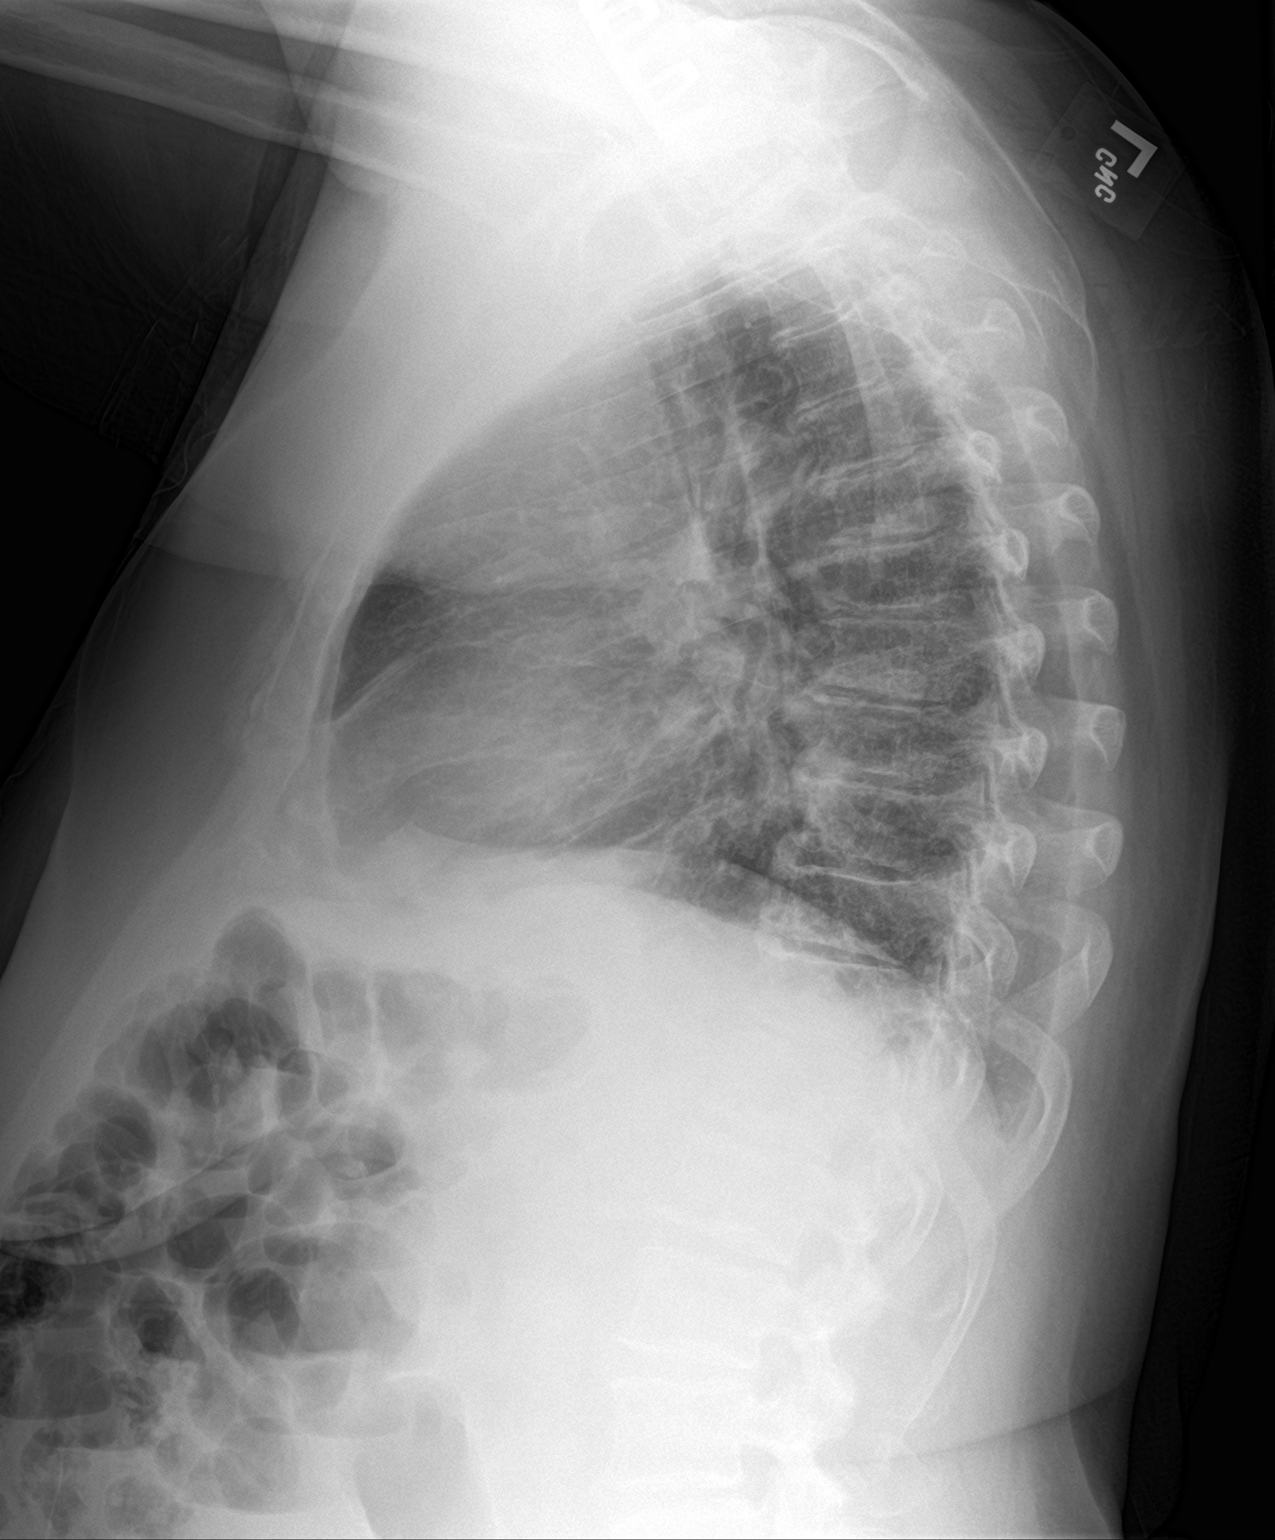

[2 of 2 positions shown; findings below may reference images not displayed]

FINDINGS: Patchy bibasilar opacities are noted, new since prior study. This is
slightly greater on the right. Heart is normal size. No effusions or
acute bony abnormality.
IMPRESSION: Patchy bibasilar atelectasis or early infiltrates.

## 2020-09-29 ENCOUNTER — Other Ambulatory Visit: Payer: Self-pay | Admitting: Orthopedic Surgery

## 2020-10-02 ENCOUNTER — Encounter: Payer: Self-pay | Admitting: Internal Medicine

## 2020-10-04 ENCOUNTER — Other Ambulatory Visit: Payer: Self-pay

## 2020-10-04 ENCOUNTER — Encounter
Admission: RE | Admit: 2020-10-04 | Discharge: 2020-10-04 | Disposition: A | Discharge status: Home / Self Care | Payer: Medicare Other | Source: Ambulatory Visit | Attending: Orthopedic Surgery | Admitting: Orthopedic Surgery

## 2020-10-04 HISTORY — DX: Hypothyroidism, unspecified: E03.9

## 2020-10-04 HISTORY — DX: Pneumonia, unspecified organism: J18.9

## 2020-10-04 NOTE — Patient Instructions (Addendum)
Your procedure is scheduled on: Thursday 8/18 Report to the Registration Desk on the 1st floor of the [facility]. To find out your arrival time, please call [phone] between 1PM - 3PM on: Wednesday 8/17  REMEMBER: Instructions that are not followed completely may result in serious medical risk, up to and including death; or upon the discretion of your surgeon and anesthesiologist your surgery may need to be rescheduled.  Do not eat food after midnight the night before surgery.  No gum chewing, lozengers or hard candies.  You may however, drink CLEAR liquids up to 2 hours before you are scheduled to arrive for your surgery. Do not drink anything within 2 hours of your scheduled arrival time.  Clear liquids include: - water  - apple juice without pulp - gatorade (not RED, PURPLE, OR BLUE) - black coffee or tea (Do NOT add milk or creamers to the coffee or tea) Do NOT drink anything that is not on this list.  In addition, your doctor has ordered for you to drink the provided  Ensure Pre-Surgery Clear Carbohydrate Drink  Drinking this carbohydrate drink up to two hours before surgery helps to reduce insulin resistance and improve patient outcomes. Please complete drinking 2 hours prior to scheduled arrival time.  TAKE THESE MEDICATIONS THE MORNING OF SURGERY WITH A SIP OF WATER:  1.Levothyroxine 2.Pantoprazole (take one the night before and one on the morning of surgery - helps to prevent nausea after surgery.)  One week prior to surgery: Stop Anti-inflammatories (NSAIDS) such as Advil, Aleve, Ibuprofen, Motrin, Naproxen, Naprosyn and Aspirin based products such as Excedrin, Goodys Powder, BC Powder. Stop ANY OVER THE COUNTER supplements until after surgery. You may however, continue to take Tylenol if needed for pain up until the day of surgery.  No Alcohol for 24 hours before or after surgery.  No Smoking including e-cigarettes for 24 hours prior to surgery.  No chewable  tobacco products for at least 6 hours prior to surgery.  No nicotine patches on the day of surgery.  Do not use any "recreational" drugs for at least a week prior to your surgery.  Please be advised that the combination of cocaine and anesthesia may have negative outcomes, up to and including death. If you test positive for cocaine, your surgery will be cancelled.  On the morning of surgery brush your teeth with toothpaste and water, you may rinse your mouth with mouthwash if you wish. Do not swallow any toothpaste or mouthwash.  Do not wear jewelry, make-up, hairpins, clips or nail polish.  Do not wear lotions, powders, or perfumes.   Do not shave body from the neck down 48 hours prior to surgery just in case you cut yourself which could leave a site for infection.  Also, freshly shaved skin may become irritated if using the CHG soap.  Contact lenses, hearing aids and dentures may not be worn into surgery.  Do not bring valuables to the hospital. [facility] is not responsible for any missing/lost belongings or valuables.   Use CHG Soap as directed on instruction sheet.  Notify your doctor if there is any change in your medical condition (cold, fever, infection).  Wear comfortable clothing (specific to your surgery type) to the hospital.  After surgery, you can help prevent lung complications by doing breathing exercises.  Take deep breaths and cough every 1-2 hours. Your doctor may order a device called an Incentive Spirometer to help you take deep breaths.  If you are being  discharged the day of surgery, you will not be allowed to drive home. You will need a responsible adult (18 years or older) to drive you home and stay with you that night.   If you are taking public transportation, you will need to have a responsible adult (18 years or older) with you. Please confirm with your physician that it is acceptable to use public transportation.   Please call the [place] [facility] Dept. at [phone] if you have any questions about these instructions.  Surgery Visitation Policy:  Patients undergoing a surgery or procedure may have one family member or support person with them as long as that person is not COVID-19 positive or experiencing its symptoms.  That person may remain in the waiting area during the procedure.

## 2020-10-05 MED ORDER — CEFAZOLIN SODIUM-DEXTROSE 2-4 GM/100ML-% IV SOLN
2.0000 g | INTRAVENOUS | Status: AC
  Administered 2020-10-06: 2 g via INTRAVENOUS

## 2020-10-06 ENCOUNTER — Ambulatory Visit: Payer: Medicare Other | Admitting: Urgent Care

## 2020-10-06 ENCOUNTER — Ambulatory Visit: Payer: Medicare Other

## 2020-10-06 ENCOUNTER — Encounter: Payer: Self-pay | Admitting: Orthopedic Surgery

## 2020-10-06 ENCOUNTER — Ambulatory Visit
Admission: RE | Admit: 2020-10-06 | Discharge: 2020-10-06 | Disposition: A | Discharge status: Home / Self Care | Payer: Medicare Other | Attending: Orthopedic Surgery | Admitting: Orthopedic Surgery

## 2020-10-06 ENCOUNTER — Other Ambulatory Visit: Payer: Self-pay

## 2020-10-06 ENCOUNTER — Encounter
Admission: RE | Disposition: A | Discharge status: Home / Self Care | Payer: Self-pay | Source: Home / Self Care | Attending: Orthopedic Surgery

## 2020-10-06 DIAGNOSIS — Z880 Allergy status to penicillin: Secondary | ICD-10-CM | POA: Diagnosis not present

## 2020-10-06 DIAGNOSIS — Y798 Miscellaneous orthopedic devices associated with adverse incidents, not elsewhere classified: Secondary | ICD-10-CM | POA: Insufficient documentation

## 2020-10-06 DIAGNOSIS — Z79899 Other long term (current) drug therapy: Secondary | ICD-10-CM | POA: Insufficient documentation

## 2020-10-06 DIAGNOSIS — Z9884 Bariatric surgery status: Secondary | ICD-10-CM | POA: Insufficient documentation

## 2020-10-06 DIAGNOSIS — M6788 Other specified disorders of synovium and tendon, other site: Secondary | ICD-10-CM | POA: Insufficient documentation

## 2020-10-06 DIAGNOSIS — Z8616 Personal history of COVID-19: Secondary | ICD-10-CM | POA: Insufficient documentation

## 2020-10-06 DIAGNOSIS — G8929 Other chronic pain: Secondary | ICD-10-CM

## 2020-10-06 DIAGNOSIS — Z91040 Latex allergy status: Secondary | ICD-10-CM | POA: Diagnosis not present

## 2020-10-06 DIAGNOSIS — Z79890 Hormone replacement therapy (postmenopausal): Secondary | ICD-10-CM | POA: Insufficient documentation

## 2020-10-06 DIAGNOSIS — Z91030 Bee allergy status: Secondary | ICD-10-CM | POA: Insufficient documentation

## 2020-10-06 DIAGNOSIS — Z885 Allergy status to narcotic agent status: Secondary | ICD-10-CM | POA: Diagnosis not present

## 2020-10-06 DIAGNOSIS — Z91018 Allergy to other foods: Secondary | ICD-10-CM | POA: Insufficient documentation

## 2020-10-06 DIAGNOSIS — M65312 Trigger thumb, left thumb: Secondary | ICD-10-CM | POA: Diagnosis not present

## 2020-10-06 DIAGNOSIS — T8484XA Pain due to internal orthopedic prosthetic devices, implants and grafts, initial encounter: Secondary | ICD-10-CM | POA: Insufficient documentation

## 2020-10-06 HISTORY — PX: CYST EXCISION: SHX5701

## 2020-10-06 HISTORY — PX: HARDWARE REMOVAL: SHX979

## 2020-10-06 HISTORY — PX: TRIGGER FINGER RELEASE: SHX641

## 2020-10-06 SURGERY — REMOVAL, HARDWARE
Anesthesia: General | Laterality: Left

## 2020-10-06 MED ORDER — HYDROMORPHONE HCL 1 MG/ML IJ SOLN
INTRAMUSCULAR | Status: DC | PRN
  Administered 2020-10-06: 1 mg via INTRAVENOUS

## 2020-10-06 MED ORDER — DIPHENHYDRAMINE HCL 50 MG/ML IJ SOLN
INTRAMUSCULAR | Status: AC
  Filled 2020-10-06: qty 1

## 2020-10-06 MED ORDER — BUPIVACAINE HCL (PF) 0.5 % IJ SOLN
INTRAMUSCULAR | Status: DC | PRN
  Administered 2020-10-06: 10 mL via PERINEURAL
  Administered 2020-10-06: 5 mL via PERINEURAL

## 2020-10-06 MED ORDER — DIPHENHYDRAMINE HCL 25 MG PO CAPS: 25.0000 mg | ORAL_CAPSULE | Freq: Four times a day (QID) | ORAL | Status: DC | PRN

## 2020-10-06 MED ORDER — HYDROMORPHONE HCL 1 MG/ML IJ SOLN
0.5000 mg | INTRAMUSCULAR | Status: DC | PRN
  Administered 2020-10-06: 1 mg via INTRAVENOUS

## 2020-10-06 MED ORDER — FENTANYL CITRATE (PF) 100 MCG/2ML IJ SOLN: 50.0000 ug | Freq: Once | INTRAMUSCULAR | Status: AC

## 2020-10-06 MED ORDER — FENTANYL CITRATE (PF) 100 MCG/2ML IJ SOLN
INTRAMUSCULAR | Status: DC | PRN
  Administered 2020-10-06 (×2): 25 ug via INTRAVENOUS
  Administered 2020-10-06: 50 ug via INTRAVENOUS

## 2020-10-06 MED ORDER — ONDANSETRON HCL 4 MG/2ML IJ SOLN
INTRAMUSCULAR | Status: DC | PRN
  Administered 2020-10-06: 4 mg via INTRAVENOUS

## 2020-10-06 MED ORDER — HYDROMORPHONE HCL 1 MG/ML IJ SOLN
INTRAMUSCULAR | Status: AC
  Filled 2020-10-06: qty 1

## 2020-10-06 MED ORDER — BUPIVACAINE HCL (PF) 0.5 % IJ SOLN
INTRAMUSCULAR | Status: AC
  Filled 2020-10-06: qty 30

## 2020-10-06 MED ORDER — LIDOCAINE HCL (PF) 2 % IJ SOLN
INTRAMUSCULAR | Status: DC | PRN
  Administered 2020-10-06 (×2): 100 mg via PERINEURAL

## 2020-10-06 MED ORDER — CHLORHEXIDINE GLUCONATE 0.12 % MT SOLN
OROMUCOSAL | Status: AC
  Administered 2020-10-06: 15 mL via OROMUCOSAL
  Filled 2020-10-06: qty 15

## 2020-10-06 MED ORDER — 0.9 % SODIUM CHLORIDE (POUR BTL) OPTIME
TOPICAL | Status: DC | PRN
  Administered 2020-10-06: 500 mL

## 2020-10-06 MED ORDER — OXYCODONE HCL 5 MG PO TABS: 5.0000 mg | ORAL_TABLET | ORAL | 0 refills | Status: DC | PRN

## 2020-10-06 MED ORDER — ONDANSETRON HCL 4 MG PO TABS: 4.0000 mg | ORAL_TABLET | Freq: Four times a day (QID) | ORAL | Status: DC | PRN

## 2020-10-06 MED ORDER — SODIUM CHLORIDE 0.9 % IV SOLN: INTRAVENOUS | Status: DC

## 2020-10-06 MED ORDER — LACTATED RINGERS IV SOLN: INTRAVENOUS | Status: DC

## 2020-10-06 MED ORDER — ORAL CARE MOUTH RINSE: 15.0000 mL | Freq: Once | OROMUCOSAL | Status: AC

## 2020-10-06 MED ORDER — MIDAZOLAM HCL 2 MG/2ML IJ SOLN
INTRAMUSCULAR | Status: AC
  Administered 2020-10-06: 1 mg via INTRAVENOUS
  Filled 2020-10-06: qty 2

## 2020-10-06 MED ORDER — FENTANYL CITRATE (PF) 100 MCG/2ML IJ SOLN
INTRAMUSCULAR | Status: AC
  Administered 2020-10-06: 50 ug via INTRAVENOUS
  Filled 2020-10-06: qty 2

## 2020-10-06 MED ORDER — LIDOCAINE HCL 1 % IJ SOLN
INTRAMUSCULAR | Status: DC | PRN
  Administered 2020-10-06: 10 mL via INTRAMUSCULAR

## 2020-10-06 MED ORDER — DEXAMETHASONE SODIUM PHOSPHATE 10 MG/ML IJ SOLN
INTRAMUSCULAR | Status: AC
  Filled 2020-10-06: qty 1

## 2020-10-06 MED ORDER — ONDANSETRON HCL 4 MG/2ML IJ SOLN
INTRAMUSCULAR | Status: AC
  Filled 2020-10-06: qty 2

## 2020-10-06 MED ORDER — FENTANYL CITRATE (PF) 100 MCG/2ML IJ SOLN
INTRAMUSCULAR | Status: AC
  Filled 2020-10-06: qty 2

## 2020-10-06 MED ORDER — MIDAZOLAM HCL 2 MG/2ML IJ SOLN: 1.0000 mg | Freq: Once | INTRAMUSCULAR | Status: AC

## 2020-10-06 MED ORDER — OXYCODONE HCL 5 MG PO TABS
5.0000 mg | ORAL_TABLET | ORAL | Status: DC | PRN
  Administered 2020-10-06: 5 mg via ORAL

## 2020-10-06 MED ORDER — FENTANYL CITRATE (PF) 100 MCG/2ML IJ SOLN
25.0000 ug | INTRAMUSCULAR | Status: DC | PRN
  Administered 2020-10-06 (×4): 50 ug via INTRAVENOUS

## 2020-10-06 MED ORDER — DIPHENHYDRAMINE HCL 50 MG/ML IJ SOLN
12.5000 mg | Freq: Once | INTRAMUSCULAR | Status: AC
  Administered 2020-10-06: 12.5 mg via INTRAVENOUS

## 2020-10-06 MED ORDER — LIDOCAINE HCL (PF) 1 % IJ SOLN
INTRAMUSCULAR | Status: AC
  Filled 2020-10-06: qty 30

## 2020-10-06 MED ORDER — PROPOFOL 10 MG/ML IV BOLUS
INTRAVENOUS | Status: DC | PRN
  Administered 2020-10-06: 120 mg via INTRAVENOUS

## 2020-10-06 MED ORDER — ONDANSETRON HCL 4 MG/2ML IJ SOLN: 4.0000 mg | Freq: Four times a day (QID) | INTRAMUSCULAR | Status: DC | PRN

## 2020-10-06 MED ORDER — ACETAMINOPHEN 325 MG PO TABS: 325.0000 mg | ORAL_TABLET | Freq: Four times a day (QID) | ORAL | Status: DC | PRN

## 2020-10-06 MED ORDER — OXYCODONE HCL 5 MG PO TABS: 10.0000 mg | ORAL_TABLET | ORAL | Status: DC | PRN

## 2020-10-06 MED ORDER — OXYCODONE HCL 5 MG PO TABS
ORAL_TABLET | ORAL | Status: AC
  Filled 2020-10-06: qty 1

## 2020-10-06 MED ORDER — NEOMYCIN-POLYMYXIN B GU 40-200000 IR SOLN
Status: AC
  Filled 2020-10-06: qty 1

## 2020-10-06 MED ORDER — FENTANYL CITRATE (PF) 100 MCG/2ML IJ SOLN
50.0000 ug | Freq: Once | INTRAMUSCULAR | Status: AC
  Administered 2020-10-06: 50 ug via INTRAVENOUS

## 2020-10-06 MED ORDER — DEXAMETHASONE SODIUM PHOSPHATE 10 MG/ML IJ SOLN
INTRAMUSCULAR | Status: DC | PRN
  Administered 2020-10-06: 5 mg via INTRAVENOUS

## 2020-10-06 MED ORDER — BUPIVACAINE HCL (PF) 0.5 % IJ SOLN
INTRAMUSCULAR | Status: AC
  Filled 2020-10-06: qty 20

## 2020-10-06 MED ORDER — CEFAZOLIN SODIUM-DEXTROSE 2-4 GM/100ML-% IV SOLN
INTRAVENOUS | Status: AC
  Filled 2020-10-06: qty 100

## 2020-10-06 MED ORDER — CHLORHEXIDINE GLUCONATE 0.12 % MT SOLN: 15.0000 mL | Freq: Once | OROMUCOSAL | Status: AC

## 2020-10-06 MED ORDER — LIDOCAINE HCL (CARDIAC) PF 100 MG/5ML IV SOSY
PREFILLED_SYRINGE | INTRAVENOUS | Status: DC | PRN
  Administered 2020-10-06: 100 mg via INTRAVENOUS

## 2020-10-06 MED ORDER — PROPOFOL 10 MG/ML IV BOLUS
INTRAVENOUS | Status: AC
  Filled 2020-10-06: qty 40

## 2020-10-06 SURGICAL SUPPLY — 52 items
APL PRP STRL LF DISP 70% ISPRP (MISCELLANEOUS) ×1
BNDG CMPR STD VLCR NS LF 5.8X2 (GAUZE/BANDAGES/DRESSINGS) ×1
BNDG ELASTIC 2X5.8 VLCR NS LF (GAUZE/BANDAGES/DRESSINGS) ×2 IMPLANT
BNDG ELASTIC 2X5.8 VLCR STR LF (GAUZE/BANDAGES/DRESSINGS) ×2 IMPLANT
CANISTER SUCT 1200ML W/VALVE (MISCELLANEOUS) IMPLANT
CAST PADDING 2X4YD ST 30245 (MISCELLANEOUS) ×1
CHLORAPREP W/TINT 26 (MISCELLANEOUS) ×2 IMPLANT
CUFF TOURN SGL QUICK 18X4 (TOURNIQUET CUFF) IMPLANT
CUFF TOURN SGL QUICK 24 (TOURNIQUET CUFF)
CUFF TOURN SGL QUICK 34 (TOURNIQUET CUFF)
CUFF TRNQT CYL 24X4X16.5-23 (TOURNIQUET CUFF) IMPLANT
CUFF TRNQT CYL 34X4.125X (TOURNIQUET CUFF) IMPLANT
DRAPE C-ARM XRAY 36X54 (DRAPES) IMPLANT
DRAPE INCISE IOBAN 66X45 STRL (DRAPES) IMPLANT
DRSG EMULSION OIL 3X8 NADH (GAUZE/BANDAGES/DRESSINGS) IMPLANT
ELECT CAUTERY BLADE 6.4 (BLADE) ×2 IMPLANT
ELECT REM PT RETURN 9FT ADLT (ELECTROSURGICAL) ×2
ELECTRODE REM PT RTRN 9FT ADLT (ELECTROSURGICAL) ×1 IMPLANT
GAUZE 4X4 16PLY ~~LOC~~+RFID DBL (SPONGE) ×2 IMPLANT
GAUZE SPONGE 4X4 12PLY STRL (GAUZE/BANDAGES/DRESSINGS) ×2 IMPLANT
GAUZE XEROFORM 1X8 LF (GAUZE/BANDAGES/DRESSINGS) ×2 IMPLANT
GLOVE SURG SYN 9.0  PF PI (GLOVE) ×2
GLOVE SURG SYN 9.0 PF PI (GLOVE) ×1 IMPLANT
GOWN SRG 2XL LVL 4 RGLN SLV (GOWNS) ×1 IMPLANT
GOWN STRL NON-REIN 2XL LVL4 (GOWNS) ×2
GOWN STRL REUS W/ TWL LRG LVL3 (GOWN DISPOSABLE) ×1 IMPLANT
GOWN STRL REUS W/TWL LRG LVL3 (GOWN DISPOSABLE) ×2
KIT TURNOVER KIT A (KITS) ×2 IMPLANT
MANIFOLD NEPTUNE II (INSTRUMENTS) ×2 IMPLANT
NEEDLE FILTER BLUNT 18X 1/2SAF (NEEDLE) ×1
NEEDLE FILTER BLUNT 18X1 1/2 (NEEDLE) ×1 IMPLANT
NEEDLE HYPO 25X1 1.5 SAFETY (NEEDLE) ×2 IMPLANT
NS IRRIG 1000ML POUR BTL (IV SOLUTION) ×2 IMPLANT
NS IRRIG 500ML POUR BTL (IV SOLUTION) ×2 IMPLANT
PACK EXTREMITY ARMC (MISCELLANEOUS) ×2 IMPLANT
PAD ABD DERMACEA PRESS 5X9 (GAUZE/BANDAGES/DRESSINGS) ×4 IMPLANT
PADDING CAST COTTON 2X4 ST (MISCELLANEOUS) ×1 IMPLANT
SCALPEL PROTECTED #15 DISP (BLADE) ×4 IMPLANT
SPONGE GAUZE 2X2 8PLY STRL LF (GAUZE/BANDAGES/DRESSINGS) ×2 IMPLANT
STAPLER SKIN PROX 35W (STAPLE) IMPLANT
SUT ETHIBOND NAB CT1 #1 30IN (SUTURE) IMPLANT
SUT ETHILON 3-0 FS-10 30 BLK (SUTURE)
SUT ETHILON 4 0 P 3 18 (SUTURE) ×2 IMPLANT
SUT ETHILON 4 0 PS 2 18 (SUTURE) ×2 IMPLANT
SUT VIC AB 0 CT1 36 (SUTURE) IMPLANT
SUT VIC AB 2-0 CT1 27 (SUTURE)
SUT VIC AB 2-0 CT1 TAPERPNT 27 (SUTURE) IMPLANT
SUT VIC AB 4-0 SH 27 (SUTURE) ×2
SUT VIC AB 4-0 SH 27XANBCTRL (SUTURE) ×1 IMPLANT
SUTURE EHLN 3-0 FS-10 30 BLK (SUTURE) IMPLANT
SYR 10ML LL (SYRINGE) ×2 IMPLANT
WATER STERILE IRR 1000ML POUR (IV SOLUTION) ×2 IMPLANT

## 2020-10-06 NOTE — Discharge Instructions (Addendum)
Keep arm elevated is much as possible. Work on finger motion is much as you can without trying to pull the fingers back. Pain medicine as directed. Loosen Ace wrap if fingers swell but leave underlying cotton wrap on until return visit. Call office if you are having problemsAMBULATORY SURGERY  DISCHARGE INSTRUCTIONS   The drugs that you were given will stay in your system until tomorrow so for the next 24 hours you should not:  Drive an automobile Make any legal decisions Drink any alcoholic beverage   You may resume regular meals tomorrow.  Today it is better to start with liquids and gradually work up to solid foods.  You may eat anything you prefer, but it is better to start with liquids, then soup and crackers, and gradually work up to solid foods.   Please notify your doctor immediately if you have any unusual bleeding, trouble breathing, redness and pain at the surgery site, drainage, fever, or pain not relieved by medication.    Additional Instructions:        Please contact your physician with any problems or Same Day Surgery at [phone], Monday through Friday 6 am to 4 pm, or [place] at [facility] number at [phone].
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
## 2020-10-06 NOTE — Anesthesia Postprocedure Evaluation (Signed)
Anesthesia Post Note  Patient: [name]  Procedure(s) Performed: Left wrist hardware removal (Left) Left thumb trigger finger release (Left) Left middle finger cyst excision (Left)  Patient location during evaluation: PACU Anesthesia Type: General Level of consciousness: awake and alert Pain management: pain level controlled Vital Signs Assessment: post-procedure vital signs reviewed and stable Respiratory status: spontaneous breathing, nonlabored ventilation, respiratory function stable and patient connected to nasal cannula oxygen Cardiovascular status: blood pressure returned to baseline and stable Postop Assessment: no apparent nausea or vomiting Anesthetic complications: no   No notable events documented.   Last Vitals:  Vitals:   10/06/20 1403 10/06/20 1413  BP:    Pulse: 81 (!) 50  Resp: (!) 22 (!) 35  Temp:  (!) 36.1 C  SpO2: 96% 93%    Last Pain:  Vitals:   10/06/20 1353  TempSrc:   PainSc: 9                  Jatavian Calica K Ayven Pheasant

## 2020-10-06 NOTE — Progress Notes (Signed)
Pateint given hearing aids and galsses back

## 2020-10-06 NOTE — Progress Notes (Signed)
Patient states pain is easing off at this poinrt., continuing to itch at times but is falling asleep at times,

## 2020-10-06 NOTE — Anesthesia Procedure Notes (Signed)
Anesthesia Regional Block: Supraclavicular block   Pre-Anesthetic Checklist: , timeout performed,  Correct Patient, Correct Site, Correct Laterality,  Correct Procedure, Correct Position, site marked,  Risks and benefits discussed,  Surgical consent,  Pre-op evaluation,  At surgeon's request and post-op pain management  Laterality: Upper and Left  Prep: chloraprep       Needles:  Injection technique: Single-shot  Needle Type: Stimiplex     Needle Length: 5cm  Needle Gauge: 22     Additional Needles:   Procedures:,,,, ultrasound used (permanent image in chart),,    Narrative:  Start time: 10/06/2020 10:35 AM End time: 10/06/2020 10:37 AM Injection made incrementally with aspirations every 5 mL.  Performed by: Personally  Anesthesiologist: Jin Capote, Precious Haws, MD  Additional Notes: Patient consented for risk and benefits of nerve block including but not limited to nerve damage, failed block, bleeding and infection.  Patient voiced understanding.  Functioning IV was confirmed and monitors were applied.  Timeout done prior to procedure and prior to any sedation being given to the patient.  Patient confirmed procedure site prior to any sedation given to the patient.  A 70m 22ga Stimuplex needle was used. Sterile prep,hand hygiene and sterile gloves were used.  Minimal sedation used for procedure.  No paresthesia endorsed by patient during the procedure.  Negative aspiration and negative test dose prior to incremental administration of local anesthetic. The patient tolerated the procedure well with no immediate complications.

## 2020-10-06 NOTE — Transfer of Care (Signed)
Immediate Anesthesia Transfer of Care Note  Patient: [name]  Procedure(s) Performed: Left wrist hardware removal (Left) Left thumb trigger finger release (Left) Left middle finger cyst excision (Left)  Patient Location: PACU  Anesthesia Type:GA combined with regional for post-op pain  Level of Consciousness: awake, oriented and patient cooperative  Airway & Oxygen Therapy: Patient Spontanous Breathing and Patient connected to face mask oxygen  Post-op Assessment: Report given to RN and Post -op Vital signs reviewed and stable  Post vital signs: Reviewed and stable  Last Vitals:  Vitals Value Taken Time  BP 143/80 10/06/20 1230  Temp 36.1 C 10/06/20 1227  Pulse 91 10/06/20 1237  Resp 15 10/06/20 1237  SpO2 100 % 10/06/20 1237  Vitals shown include unvalidated device data.  Last Pain:  Vitals:   10/06/20 1230  TempSrc:   PainSc: 10-Worst pain ever         Complications: No notable events documented.

## 2020-10-06 NOTE — Op Note (Signed)
10/06/2020  12:22 PM  PATIENT:  [name]  70 y.o. female  PRE-OPERATIVE DIAGNOSIS:  S/P ORIF fracture Z98.890, Z87.81 Closed fracture of distal end of left radius with routine healing, unspecified fracture morphology, subsequent encounter S52.502 D Trigger finger of left thumb M65.312 Painful orthopedic hardware T84.84XA Cyst of left middle finger  POST-OPERATIVE DIAGNOSIS:  S/P ORIF fracture Z98.890, Z87.81 Closed fracture of distal end of left radius with routine healing, unspecified fracture morphology, subsequent encounter S52.502 D Trigger finger of left thumb M65.312 Painful orthopedic hardware T84.84XA Cyst of left middle finger  PROCEDURE:  Procedure(s) with comments: Left wrist hardware removal (Left) - NEED BLOCK Left thumb trigger finger release (Left) Left middle finger cyst excision (Left)  SURGEON: Laurene Footman, MD  ASSISTANTS: None  ANESTHESIA:   general  EBL:  Total I/O In: 800 [I.V.:700; IV Piggyback:100] Out: 5 [Blood:5]  BLOOD ADMINISTERED:none  DRAINS: none   LOCAL MEDICATIONS USED:  MARCAINE    and XYLOCAINE   SPECIMEN:  No Specimen  DISPOSITION OF SPECIMEN:  N/A  COUNTS:  YES  TOURNIQUET:   Total Tourniquet Time Documented: Upper Arm (Left) - -29 minutes Total: Upper Arm (Left) - -29 minutes   IMPLANTS: None  DICTATION: .Dragon Dictation patient was brought to the operating room and a walking boot obtained preoperatively.  Analgesia.  After the patient was placed under general anesthesia the left arm was prepped and draped in usual sterile fashion.  After patient identification timeout procedures were completed trigger finger was addressed first with a small transverse incision about a centimeter in length at the base of the thumb volarly there is a large cyst associated with the A1 pulley which was then incised and trigger finger release carried out with no triggering following release with passive range of motion the tendon itself  was intact with mild flexor tenosynovitis.  Next the tourniquet was raised and the cyst in the in line with the middle finger over the distal metacarpal had a transverse incision made there is a small flexor tendon sheath cyst which was excised tendon again mild tenosynovitis.  Going to the prior incision and the prior incision was opened scar tissue incised and the flexor tendon retracted radially with exposure of the plate entire plate was removed without difficulty with mild flexor tenosynovitis noted deep.  Wound was thoroughly irrigated the prior incision was closed with 4-0 Vicryl followed by 4-0 nylon in a simple erupted fashion as were the 2 hand incisions.  The wounds were dressed with Xeroform 4 x 4's web roll and an Ace wrap with 10 cc of half percent Sensorcaine 1% Xylocaine mixture at the proximal incision prior to wound closure.  PLAN OF CARE: Discharge to home after PACU  PATIENT DISPOSITION:  PACU - hemodynamically stable.

## 2020-10-06 NOTE — H&P (Signed)
Chief Complaint  Patient presents with   Left Wrist - Post Operative Visit, Pain    History of the Present Illness: [name] is a 70 y.o. female here today.   The patient presents for follow-up evaluation status post ORIF of the left distal radius, carpal tunnel release, and has developed reflex dystrophy symptoms. The patient is still having some chronic pain and has been asking for pain medications, but it has been several months since we had seen her. She comes back today for follow-up with x-ray.   The patient states she cannot use her left hand. She states her left thumb locked in clinic and is very painful. She is still having tingling and numbness. 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 She notes the pain is deep in the left hand every time she tries to use it. She states she cannot live like this. The patient states she cannot take OXYCODONE due to itching, and notes she is allergic to HYDROCODONE which causes hallucinations.   The patient states she has a torn rotator cuff.  I have reviewed past medical, surgical, social and family history, and allergies as documented in the EMR.  Past Medical History: Past Medical History:  Diagnosis Date   Chronic obstructive pulmonary disease (CMS-HCC)   GERD (gastroesophageal reflux disease)   Hypertension   Hypothyroidism   Past Surgical History: Past Surgical History:  Procedure Laterality Date   ENDOSCOPIC CARPAL TUNNEL RELEASE Left 02/05/2020  Dr. Rudene Christians   Gastric sleeve   HYSTERECTOMY   ORIF DISTAL RADIUS FRACTURE Right   ORIF DISTAL RADIUS FRACTURE Left 02/05/2020  Dr. Rudene Christians   Past Family History: History reviewed. No pertinent family history.  Medications: Current Outpatient Medications Ordered in Epic  Medication Sig Dispense Refill   albuterol 90 mcg/actuation inhaler Inhale 2 inhalations into the lungs 2 (two) times daily   clobetasoL  (TEMOVATE) 0.05 % ointment Apply topically   diclofenac (VOLTAREN) 1 % topical gel Apply topically   EPINEPHrine (EPIPEN) 0.3 mg/0.3 mL auto-injector as needed   fluticasone propionate (FLONASE) 50 mcg/actuation nasal spray Place 2 sprays into both nostrils once daily   levothyroxine (SYNTHROID) 100 MCG tablet Take 1 tablet by mouth once daily   linaCLOtide (LINZESS) 290 mcg capsule Take 1 capsule by mouth once daily   nitroGLYcerin (NITROSTAT) 0.4 MG SL tablet Place 0.4 mg under the tongue as needed   ondansetron (ZOFRAN-ODT) 4 MG disintegrating tablet TAKE 1 TABLET BY MOUTH EVERY 8 HOURS AS NEEDED FOR NAUSEA FOR UP TO 7 DAYS 20 tablet 1   oxyCODONE (ROXICODONE) 5 MG immediate release tablet Take 1 tablet (5 mg total) by mouth every 8 (eight) hours as needed for Pain 30 tablet 0   pantoprazole (PROTONIX) 40 MG DR tablet Take 1 tablet by mouth once daily   potassium chloride (KLOR-CON) 20 MEQ ER tablet Take 2 tablets by mouth once daily   promethazine (PHENERGAN) 25 MG tablet TAKE 1 TABLET BY MOUTH EVERY 6 HOURS AS NEEDED FOR NAUSEA 30 tablet 0   acetaminophen-codeine (TYLENOL #3) 300-30 mg tablet Take 1 tablet by mouth every 4 (four) hours as needed for Pain for up to 5 days 25 tablet 0   metoprolol tartrate (LOPRESSOR) 100 MG tablet Take 1 tablet by mouth once daily (Patient not taking: No sig reported)   No current Epic-ordered facility-administered medications on file.   Allergies: Allergies  Allergen  Reactions   Mango Flavor Hives   Venom-Honey Bee Anaphylaxis   Wasp Venom Anaphylaxis   Bee Pollens Itching   Hydrocodone Nausea and Hallucination   Latex, Natural Rubber Rash   Procaine Hcl Unknown  Pt not sure of reaction Pt not sure of reaction   Tramadol Hives   Hydrocodone-Acetaminophen Nausea And Vomiting and Nausea  Halluninations hallucinations    Body mass index is 32.42 kg/m.  Review of Systems: A comprehensive 14 point ROS was performed, reviewed, and the  pertinent orthopaedic findings are documented in the HPI.  Vitals:  09/26/20 0936  BP: 128/74    General Physical Examination:   General/Constitutional: No apparent distress: well-nourished and well developed. Eyes: Pupils equal, round with synchronous movement. Lungs:  Clear to auscultation HEENT:  Normal Vascular: No edema, swelling or tenderness, except as noted in detailed exam. Cardiac: Heart rate and rhythm is regular. Integumentary: No impressive skin lesions present, except as noted in detailed exam. Neuro/Psych: Normal mood and affect, oriented to person, place and time.  Musculoskeletal Examination:  On exam, tenderness throughout the left hand. Left thumb triggering.  Radiographs:  AP, lateral, and oblique x-rays of the left wrist were ordered and personally reviewed today. These show healed distal radius fracture. There is also a plate on the ulna from prior fracture. There is no evidence of migration of the plate or screws. Her most proximal plate screw had come out about 1 mm, and that is unchanged.  X-ray Impression Healed distal radius fracture.  Assessment: ICD-10-CM  1. S/P ORIF (open reduction internal fixation) fracture Z98.890  Z87.81  2. Closed fracture of distal end of left radius with routine healing, unspecified fracture morphology, subsequent encounter S52.502D  3. Trigger finger of left thumb M65.312  4. Painful orthopaedic hardware (CMS-HCC) [id number]   Plan:  The patient has clinical findings of healed left distal radius fracture and left thumb trigger finger.  We discussed the patient's x-ray findings. I recommend left wrist trigger finger release and hardware removal of the left distal radius. I explained the surgery and postoperative course in detail. The patient would like to proceed with left thumb trigger finger injection. I wrote her a prescription for Tylenol No. 3. The patient will see Dessie Coma, OTR/L, CLT for hand therapy  following surgery.   The patient will follow up as needed.   Attestation: I, Dawn Royse, am documenting for [employer], MD utilizing [place].    Electronically signed by Lauris Poag, MD at 09/26/2020 8:03 PM EDT  Patient sees a nodule below long finger, it does not move unless she extends her fingers. It is not painful. She will send a picture through Mychart, and just wants you to be aware. Electronically signed by Ephriam Jenkins, [place] at 10/03/2020 11:15 AM EDT  New nodule in palm, middle finger.

## 2020-10-06 NOTE — Anesthesia Procedure Notes (Signed)
Procedure Name: LMA Insertion Date/Time: 10/06/2020 11:27 AM Performed by: Lia Foyer, CRNA Pre-anesthesia Checklist: Patient identified, Emergency Drugs available, Suction available and Patient being monitored Patient Re-evaluated:Patient Re-evaluated prior to induction Oxygen Delivery Method: Circle system utilized Preoxygenation: Pre-oxygenation with 100% oxygen Induction Type: IV induction Ventilation: Mask ventilation without difficulty LMA: LMA inserted LMA Size: 4.0 Tube type: Oral Number of attempts: 1 Airway Equipment and Method: Oral airway Placement Confirmation: positive ETCO2 and breath sounds checked- equal and bilateral Tube secured with: Tape Dental Injury: Teeth and Oropharynx as per pre-operative assessment

## 2020-10-07 ENCOUNTER — Encounter: Payer: Self-pay | Admitting: Orthopedic Surgery

## 2020-10-07 ENCOUNTER — Other Ambulatory Visit: Payer: Self-pay | Admitting: Internal Medicine

## 2020-10-13 ENCOUNTER — Other Ambulatory Visit: Payer: Self-pay | Admitting: Internal Medicine

## 2020-10-13 ENCOUNTER — Telehealth: Payer: Self-pay | Admitting: Internal Medicine

## 2020-10-13 MED ORDER — LINACLOTIDE 290 MCG PO CAPS: 290.0000 ug | ORAL_CAPSULE | Freq: Every day | ORAL | 0 refills | Status: DC

## 2020-10-13 MED ORDER — PANTOPRAZOLE SODIUM 40 MG PO TBEC: 40.0000 mg | DELAYED_RELEASE_TABLET | Freq: Every day | ORAL | 0 refills | Status: DC

## 2020-10-13 NOTE — Telephone Encounter (Signed)
Patient calling to request refills on Protonix and Linzess until her follow up 12/29/20. Requested that she get a call once done so she knows.

## 2020-10-13 NOTE — Telephone Encounter (Signed)
Rx for pantoprazole and Linzess sent to pharmacy as requested.  Patient is aware.

## 2020-11-22 ENCOUNTER — Ambulatory Visit: Payer: Medicare Other | Admitting: Occupational Therapy

## 2020-11-29 ENCOUNTER — Encounter: Payer: Self-pay | Admitting: Occupational Therapy

## 2020-11-29 ENCOUNTER — Ambulatory Visit: Payer: Medicare Other | Attending: Orthopedic Surgery | Admitting: Occupational Therapy

## 2020-11-29 DIAGNOSIS — M79602 Pain in left arm: Secondary | ICD-10-CM | POA: Insufficient documentation

## 2020-11-29 DIAGNOSIS — M25642 Stiffness of left hand, not elsewhere classified: Secondary | ICD-10-CM | POA: Insufficient documentation

## 2020-11-29 DIAGNOSIS — M6281 Muscle weakness (generalized): Secondary | ICD-10-CM | POA: Insufficient documentation

## 2020-11-29 DIAGNOSIS — M25632 Stiffness of left wrist, not elsewhere classified: Secondary | ICD-10-CM | POA: Diagnosis present
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
## 2020-12-01 ENCOUNTER — Ambulatory Visit: Payer: Medicare Other | Admitting: Occupational Therapy

## 2020-12-01 DIAGNOSIS — M25632 Stiffness of left wrist, not elsewhere classified: Secondary | ICD-10-CM

## 2020-12-01 DIAGNOSIS — M79602 Pain in left arm: Secondary | ICD-10-CM

## 2020-12-01 DIAGNOSIS — M6281 Muscle weakness (generalized): Secondary | ICD-10-CM

## 2020-12-01 DIAGNOSIS — M25642 Stiffness of left hand, not elsewhere classified: Secondary | ICD-10-CM
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
## 2020-12-07 ENCOUNTER — Ambulatory Visit: Payer: Medicare Other | Admitting: Occupational Therapy

## 2020-12-07 DIAGNOSIS — M25632 Stiffness of left wrist, not elsewhere classified: Secondary | ICD-10-CM

## 2020-12-07 DIAGNOSIS — M25642 Stiffness of left hand, not elsewhere classified: Secondary | ICD-10-CM

## 2020-12-07 DIAGNOSIS — M79602 Pain in left arm: Secondary | ICD-10-CM | POA: Diagnosis not present

## 2020-12-07 DIAGNOSIS — M6281 Muscle weakness (generalized): Secondary | ICD-10-CM
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
## 2020-12-09 ENCOUNTER — Ambulatory Visit: Payer: Medicare Other | Admitting: Occupational Therapy

## 2020-12-09 ENCOUNTER — Other Ambulatory Visit: Payer: Self-pay

## 2020-12-09 DIAGNOSIS — M25632 Stiffness of left wrist, not elsewhere classified: Secondary | ICD-10-CM

## 2020-12-09 DIAGNOSIS — M25642 Stiffness of left hand, not elsewhere classified: Secondary | ICD-10-CM

## 2020-12-09 DIAGNOSIS — M6281 Muscle weakness (generalized): Secondary | ICD-10-CM

## 2020-12-09 DIAGNOSIS — M79602 Pain in left arm: Secondary | ICD-10-CM | POA: Diagnosis not present
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
## 2020-12-16 ENCOUNTER — Ambulatory Visit: Payer: Medicare Other | Admitting: Occupational Therapy

## 2020-12-16 ENCOUNTER — Other Ambulatory Visit: Payer: Self-pay

## 2020-12-16 DIAGNOSIS — M79602 Pain in left arm: Secondary | ICD-10-CM | POA: Diagnosis not present

## 2020-12-16 DIAGNOSIS — M6281 Muscle weakness (generalized): Secondary | ICD-10-CM

## 2020-12-16 DIAGNOSIS — M25642 Stiffness of left hand, not elsewhere classified: Secondary | ICD-10-CM

## 2020-12-16 DIAGNOSIS — M25632 Stiffness of left wrist, not elsewhere classified: Secondary | ICD-10-CM
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
## 2020-12-19 ENCOUNTER — Ambulatory Visit: Payer: Medicare Other | Admitting: Occupational Therapy

## 2020-12-19 DIAGNOSIS — M79602 Pain in left arm: Secondary | ICD-10-CM | POA: Diagnosis not present

## 2020-12-19 DIAGNOSIS — M25642 Stiffness of left hand, not elsewhere classified: Secondary | ICD-10-CM

## 2020-12-19 DIAGNOSIS — M6281 Muscle weakness (generalized): Secondary | ICD-10-CM

## 2020-12-19 DIAGNOSIS — M25632 Stiffness of left wrist, not elsewhere classified: Secondary | ICD-10-CM
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
## 2020-12-28 ENCOUNTER — Other Ambulatory Visit: Payer: Self-pay | Admitting: Internal Medicine

## 2020-12-29 ENCOUNTER — Other Ambulatory Visit: Payer: Self-pay | Admitting: Internal Medicine

## 2020-12-29 ENCOUNTER — Encounter: Payer: Self-pay | Admitting: Internal Medicine

## 2020-12-29 ENCOUNTER — Ambulatory Visit (INDEPENDENT_AMBULATORY_CARE_PROVIDER_SITE_OTHER): Payer: Medicare Other | Admitting: Internal Medicine

## 2020-12-29 VITALS — BP 130/86 | HR 63 | Ht 60.0 in | Wt 176.0 lb

## 2020-12-29 DIAGNOSIS — K760 Fatty (change of) liver, not elsewhere classified: Secondary | ICD-10-CM | POA: Diagnosis not present

## 2020-12-29 DIAGNOSIS — Z86010 Personal history of colonic polyps: Secondary | ICD-10-CM

## 2020-12-29 DIAGNOSIS — K5903 Drug induced constipation: Secondary | ICD-10-CM | POA: Diagnosis not present

## 2020-12-29 DIAGNOSIS — K219 Gastro-esophageal reflux disease without esophagitis: Secondary | ICD-10-CM | POA: Diagnosis not present

## 2020-12-29 DIAGNOSIS — T402X5A Adverse effect of other opioids, initial encounter: Secondary | ICD-10-CM

## 2020-12-29 DIAGNOSIS — K581 Irritable bowel syndrome with constipation: Secondary | ICD-10-CM | POA: Diagnosis not present

## 2020-12-29 MED ORDER — PLENVU 140 G PO SOLR: 1.0000 | ORAL | 0 refills | Status: DC

## 2020-12-29 MED ORDER — PANTOPRAZOLE SODIUM 40 MG PO TBEC: 40.0000 mg | DELAYED_RELEASE_TABLET | Freq: Every day | ORAL | 0 refills | Status: DC

## 2020-12-29 MED ORDER — NALOXEGOL OXALATE 12.5 MG PO TABS: 12.5000 mg | ORAL_TABLET | Freq: Every day | ORAL | 0 refills | Status: DC

## 2020-12-29 MED ORDER — LINACLOTIDE 290 MCG PO CAPS
290.0000 ug | ORAL_CAPSULE | Freq: Every day | ORAL | 0 refills | Status: DC
Start: 2020-12-29 — End: 2021-03-16

## 2020-12-29 NOTE — Patient Instructions (Addendum)
You have been scheduled for a colonoscopy. Please follow written instructions given to you at your visit today.  Please pick up your prep supplies at the pharmacy within the next 1-3 days. If you use inhalers (even only as needed), please bring them with you on the day of your procedure. ______________________________________________________  We have sent the following medications to your pharmacy for you to pick up at your convenience: Linzess 290 mcg daily Pantoprazole Movantik 12.5 mg daily ______________________________________________________  If you are age 63 or older, your body mass index should be between 23-30. Your Body mass index is 34.37 kg/m. If this is out of the aforementioned range listed, please consider follow up with your Primary Care Provider.  If you are age 30 or younger, your body mass index should be between 19-25. Your Body mass index is 34.37 kg/m. If this is out of the aformentioned range listed, please consider follow up with your Primary Care Provider.   ________________________________________________________  The [facility] providers would like to encourage you to use [facility] to communicate with providers for non-urgent requests or questions.  Due to long hold times on the telephone, sending your provider a message by [facility] may be a faster and more efficient way to get a response.  Please allow 48 business hours for a response.  Please remember that this is for non-urgent requests.  _______________________________________________________  Due to recent changes in healthcare laws, you may see the results of your imaging and laboratory studies on MyChart before your provider has had a chance to review them.  We understand that in some cases there may be results that are confusing or concerning to you. Not all laboratory results come back in the same time frame and the provider may be waiting for multiple results in order to interpret others.  Please give [place] 48  hours in order for your provider to thoroughly review all the results before contacting the office for clarification of your results.

## 2020-12-29 NOTE — Progress Notes (Signed)
Subjective:    Patient ID: [name], female    DOB: [birth year], 70 y.o.   MRN: [id number]  HPI [name] is a 70 year old female with a history of GERD, gastric sleeve in 2020, history of C. difficile colitis associated with antibiotic use, history of IBS with chronic constipation, fatty liver who is here for follow-up.  She is here alone today and I last saw her on 10/21/2019.  Since she was last here she has had further trauma and fracture of the left wrist.  She had previously been attacked by dog and broke the right arm.  She continues to have chronic pain from this and is treated with oxycodone 4 times a day.  She had an upper endoscopy performed on 03/29/2020 at [facility] to follow-up the gastric sleeve and to exclude Barrett's.  This was performed by Dr. Darrick Grinder.  This revealed an irregular Z-line which was biopsied.  The biopsies did not show Barrett's.  There was a medium sized hiatal hernia.  The gastric sleeve appeared healthy.  There was mild erythema and petechial mucosa in the antrum which was biopsied.  The duodenum was normal.  Gastric biopsies showed reactive gastropathy with vascular congestion but no H. pylori.  She reports that her reflux has been rather well controlled with the pantoprazole 40 mg daily.  She has gained some weight back lately which she finds discouraging.  Her biggest issue has been that her Linzess 290 mcg daily is not working as effectively.  She has less frequent stools but also more incomplete stool.  She attributes this to the oxycodone.  However without the Linzess she will go many days between bowel movements.  She has not had rectal bleeding or melena.  She is not having belly pain.   Review of Systems As per HPI, otherwise negative  Current Medications, Allergies, Past Medical History, Past Surgical History, Family History and Social History were reviewed in [employer] record.    Objective:   Physical Exam BP 130/86    Pulse 63   Ht 5' (1.524 m)   Wt 176 lb (79.8 kg)   BMI 34.37 kg/m  Gen: awake, alert, NAD HEENT: anicteric  CV: RRR, no mrg Pulm: CTA b/l Abd: soft, NT/ND, +BS throughout Ext: no c/c/e Neuro: nonfocal     Assessment & Plan:  70 year old female with a history of GERD, gastric sleeve in 2020, history of C. difficile colitis associated with antibiotic use, history of IBS with chronic constipation, fatty liver who is here for follow-up.   IBS with chronic constipation/opioid-induced constipation --we discussed this today.  Linzess has been working well but when oxycodone was added since her fractures she has had worsening of constipation.  We discussed Movantik though I think she may still need Linzess due to her IBS C. --Continue Linzess 290 mcg daily --Add Movantik 12.5 mg once daily; this can be discontinued if she is able to wean off narcotic pain medications.  I asked that she send me a MyChart message to let me know how this medication is working.  If ineffective we should consider the 25 mg daily dose  2.  GERD --well-controlled on pantoprazole.  We discussed how GERD can worsen after gastric sleeve.  [facility] performed an upper endoscopy as above.  She did not have Barrett's esophagus. --Continue pantoprazole 40 mg once daily  3.  Fatty liver --no evidence for advanced liver disease.  Less likely an issue for her going forward after gastric sleeve assuming  that she is able to maintain a healthy weight.  Her hepatic function panel on 09/19/2020 was completely normal.  This is very reassuring.  4.  History of colonic polyps --surveillance colonoscopy due very soon, we will schedule surveillance for February 2023.  We reviewed the risk, benefits and alternatives and she is agreeable wishes to proceed  30 minutes total spent today including patient facing time, coordination of care, reviewing medical history/procedures/pertinent radiology studies, and documentation of the encounter.

## 2021-01-02 ENCOUNTER — Ambulatory Visit: Payer: Medicare Other | Attending: Orthopedic Surgery | Admitting: Occupational Therapy

## 2021-01-02 DIAGNOSIS — M25632 Stiffness of left wrist, not elsewhere classified: Secondary | ICD-10-CM | POA: Insufficient documentation

## 2021-01-02 DIAGNOSIS — M79602 Pain in left arm: Secondary | ICD-10-CM | POA: Insufficient documentation

## 2021-01-02 DIAGNOSIS — M25642 Stiffness of left hand, not elsewhere classified: Secondary | ICD-10-CM | POA: Diagnosis present

## 2021-01-02 DIAGNOSIS — M6281 Muscle weakness (generalized): Secondary | ICD-10-CM | POA: Insufficient documentation
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
## 2021-01-05 ENCOUNTER — Ambulatory Visit: Payer: Medicare Other | Admitting: Occupational Therapy

## 2021-01-05 NOTE — H&P (Signed)
Date: September 27, 2020   Patient: [name]  PID: [id number]  DOB: 11-15-1950  SEX: Female   Patient referred by general dentist for extraction all remaining teeth, insertion immediate dentures  CC: Teeth hurt  Past Medical History:  Obese, GERD, Asthma, COPD, Snoring, Sleep Apnea, Thyroid trouble, Osteopenia, Arthritis, HTN, Heart Murmur    Medications: Klor-Con, Tylenol #3, Promethazine, Linzess, Levothyroxine, Pantoprazole    Allergies:     Hydrocodone    Surgeries:   Hand surgery, Arm surgery, spinal surgery, knee surgery     Social History       Smoking:   n         Alcohol:n Drug use:n                             Exam: BMI  32.  Bilaterally edentulous maxilla and mandible. Teeth # 6-11, 21-25, 27-29 remain. Teeth tender to touch. No purulence, edema, fluctuance, trismus. Oral cancer screening negative. Pharynx clear. No lymphadenopathy.  Panorex:Bilaterally edentulous maxilla and mandible. Teeth # 6-11, 21-25, 27-29 remain.  Assessment: ASA 3. Non-restorable. 6-11, 21-25, 27-29               Plan: 1. MD Clearance  2. Extraction Teeth # 6-11, 21-25, 27-29.Alveoloplasty. Deliver immediate dentures.  Hospital Day surgery.                 Rx: n               Risks and complications explained. Questions answered.   Gae Bon, DMD

## 2021-01-09 ENCOUNTER — Ambulatory Visit: Payer: Medicare Other | Admitting: Occupational Therapy

## 2021-01-09 DIAGNOSIS — M25632 Stiffness of left wrist, not elsewhere classified: Secondary | ICD-10-CM

## 2021-01-09 DIAGNOSIS — M79602 Pain in left arm: Secondary | ICD-10-CM

## 2021-01-09 DIAGNOSIS — M6281 Muscle weakness (generalized): Secondary | ICD-10-CM

## 2021-01-09 DIAGNOSIS — M25642 Stiffness of left hand, not elsewhere classified: Secondary | ICD-10-CM
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
## 2021-01-10 NOTE — H&P (Signed)
  Patient: [name]  PID: [id number]  DOB: Sep 27, 1950  SEX: Female   Patient referred by general dentist for extraction all remaining teeth, insertion immediate dentures  CC: Teeth hurt  Past Medical History:  Obese, GERD, Asthma, COPD, Snoring, Sleep Apnea, Thyroid trouble, Osteopenia, Arthritis, HTN, Heart Murmur    Medications: Klor-Con, Tylenol #3, Promethazine, Linzess, Levothyroxine, Pantoprazole    Allergies:     Hydrocodone    Surgeries:   Hand surgery, Arm surgery, spinal surgery, knee surgery     Social History       Smoking:   n         Alcohol:n Drug use:n                             Exam: BMI  32.  HEENT, PERRLA, EMOI.  Oral: Bilaterally edentulous maxilla and mandible. Teeth # 6-11, 21-25, 27-29 remain. Teeth tender to touch. No purulence, edema, fluctuance, trismus. Oral cancer screening negative. Pharynx clear.  Neck: No lymphadenopathy. Cor: RRR Lungs: Clear  Panorex:Bilaterally edentulous maxilla and mandible. Teeth # 6-11, 21-25, 27-29 remain.  Assessment: ASA 3. Non-restorable. 6-11, 21-25, 27-29               Plan: 1. MD Clearance  2. Extraction Teeth # 6-11, 21-25, 27-29.Alveoloplasty. Deliver immediate dentures.  Hospital Day surgery.                 Rx: n               Risks and complications explained. Questions answered.   Gae Bon, DMD

## 2021-01-14 ENCOUNTER — Encounter (HOSPITAL_COMMUNITY): Payer: Self-pay | Admitting: Oral Surgery

## 2021-01-14 NOTE — Progress Notes (Signed)
PCP - [name] 2 [facility] in [place] - Dr. Timoteo Gaul Cardiology-clearance note in chart Pulmonologist: [facility]--pt. Reports she was released and to follow up with PCP EKG - requested Chest x-ray - na ECHO - 2019 Cardiac Cath - 2016 CPAP - doesn't use, last sleep study 2021, she thinks somewhere in [place]  Anesthesia review: clearance and medical hx.  -------------  SDW INSTRUCTIONS:  Your procedure is scheduled on 11/29. Please report to [facility] Entrance "A" at 1245 p.M., and check in at the Admitting office. Call this number if you have problems the morning of surgery: [phone]   Remember: Do not eat or drink after midnight the night before your surgery    Medications to take morning of surgery with a sip of water include: Synthroid,liness,protonix,pre: inhalers,oxycodone, phenergan  As of today, STOP taking any Aspirin (unless otherwise instructed by your surgeon), Aleve, Naproxen, Ibuprofen, Motrin, Advil, Goody's, BC's, all herbal medications, fish oil, and all vitamins.    The Morning of Surgery Do not wear jewelry, make-up or nail polish. Do not wear lotions, powders, or perfumes/colognes, or deodorant Do not shave 48 hours prior to surgery.   Men may shave face and neck. Do not bring valuables to the hospital. [facility] is not responsible for any belongings or valuables.  If you are a smoker, DO NOT Smoke 24 hours prior to surgery  If you wear a CPAP at night please bring your mask the morning of surgery   Remember that you must have someone to transport you home after your surgery, and remain with you for 24 hours if you are discharged the same day.  Please bring cases for contacts, glasses, hearing aids, dentures or bridgework because it cannot be worn into surgery.   Patients discharged the day of surgery will not be allowed to drive home.   Please shower the  NIGHT BEFORE/MORNING OF SURGERY (use antibacterial soap like DIAL soap if possible). Wear comfortable clothes the morning of surgery. Oral Hygiene is also important to reduce your risk of infection.  Remember - BRUSH YOUR TEETH THE MORNING OF SURGERY WITH YOUR REGULAR TOOTHPASTE  Patient denies shortness of breath, fever, cough and chest pain.

## 2021-01-16 ENCOUNTER — Encounter (HOSPITAL_COMMUNITY): Payer: Self-pay | Admitting: Oral Surgery
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
## 2021-01-16 NOTE — Anesthesia Preprocedure Evaluation (Deleted)
Anesthesia Evaluation    Airway        Dental   Pulmonary           Cardiovascular hypertension,      Neuro/Psych    GI/Hepatic   Endo/Other    Renal/GU      Musculoskeletal   Abdominal   Peds  Hematology   Anesthesia Other Findings   Reproductive/Obstetrics                             Anesthesia Physical Anesthesia Plan  ASA:   Anesthesia Plan:    Post-op Pain Management:    Induction:   PONV Risk Score and Plan:   Airway Management Planned:   Additional Equipment:   Intra-op Plan:   Post-operative Plan:   Informed Consent:   Plan Discussed with:   Anesthesia Plan Comments: (PAT note written 01/16/2021 by Myra Gianotti, PA-C. )        Anesthesia Quick Evaluation

## 2021-01-17 ENCOUNTER — Ambulatory Visit (HOSPITAL_COMMUNITY): Payer: Medicare Other | Admitting: Vascular Surgery

## 2021-01-17 ENCOUNTER — Encounter (HOSPITAL_COMMUNITY)
Admission: RE | Disposition: A | Discharge status: Home / Self Care | Payer: Self-pay | Source: Home / Self Care | Attending: Internal Medicine

## 2021-01-17 ENCOUNTER — Observation Stay (HOSPITAL_COMMUNITY)
Admission: RE | Admit: 2021-01-17 | Discharge: 2021-01-18 | Disposition: A | Discharge status: Home / Self Care | Payer: Medicare Other | Attending: Internal Medicine | Admitting: Internal Medicine

## 2021-01-17 ENCOUNTER — Encounter: Payer: Medicare Other | Admitting: Occupational Therapy

## 2021-01-17 DIAGNOSIS — J441 Chronic obstructive pulmonary disease with (acute) exacerbation: Secondary | ICD-10-CM | POA: Insufficient documentation

## 2021-01-17 DIAGNOSIS — K029 Dental caries, unspecified: Principal | ICD-10-CM | POA: Insufficient documentation

## 2021-01-17 DIAGNOSIS — E039 Hypothyroidism, unspecified: Secondary | ICD-10-CM | POA: Diagnosis not present

## 2021-01-17 DIAGNOSIS — E876 Hypokalemia: Secondary | ICD-10-CM | POA: Diagnosis present

## 2021-01-17 DIAGNOSIS — K589 Irritable bowel syndrome without diarrhea: Secondary | ICD-10-CM | POA: Diagnosis present

## 2021-01-17 DIAGNOSIS — J45909 Unspecified asthma, uncomplicated: Secondary | ICD-10-CM | POA: Diagnosis not present

## 2021-01-17 DIAGNOSIS — F419 Anxiety disorder, unspecified: Secondary | ICD-10-CM | POA: Diagnosis not present

## 2021-01-17 DIAGNOSIS — F410 Panic disorder [episodic paroxysmal anxiety] without agoraphobia: Secondary | ICD-10-CM

## 2021-01-17 DIAGNOSIS — I10 Essential (primary) hypertension: Secondary | ICD-10-CM | POA: Insufficient documentation

## 2021-01-17 DIAGNOSIS — Z91040 Latex allergy status: Secondary | ICD-10-CM | POA: Diagnosis not present

## 2021-01-17 DIAGNOSIS — Z8616 Personal history of COVID-19: Secondary | ICD-10-CM | POA: Diagnosis not present

## 2021-01-17 HISTORY — DX: Bradycardia, unspecified: R00.1

## 2021-01-17 HISTORY — PX: MULTIPLE EXTRACTIONS WITH ALVEOLOPLASTY: SHX5342

## 2021-01-17 LAB — BASIC METABOLIC PANEL
Anion gap: 11 (ref 5–15)
BUN: 18 mg/dL (ref 8–23)
CO2: 29 mmol/L (ref 22–32)
Calcium: 8.8 mg/dL — ABNORMAL LOW (ref 8.9–10.3)
Chloride: 100 mmol/L (ref 98–111)
Creatinine, Ser: 0.83 mg/dL (ref 0.44–1.00)
GFR, Estimated: >60 mL/min (ref >60)
Glucose, Bld: 93 mg/dL (ref 70–99)
Potassium: 2.7 mmol/L — CL (ref 3.5–5.1)
Sodium: 140 mmol/L (ref 135–145)

## 2021-01-17 LAB — POCT I-STAT, CHEM 8
BUN: 20 mg/dL (ref 8–23)
Calcium, Ion: 1 mmol/L — ABNORMAL LOW (ref 1.15–1.40)
Chloride: 101 mmol/L (ref 98–111)
Creatinine, Ser: 0.8 mg/dL (ref 0.44–1.00)
Glucose, Bld: 97 mg/dL (ref 70–99)
HCT: 39 % (ref 36.0–46.0)
Hemoglobin: 13.3 g/dL (ref 12.0–15.0)
Potassium: 2.9 mmol/L — ABNORMAL LOW (ref 3.5–5.1)
Sodium: 141 mmol/L (ref 135–145)
TCO2: 30 mmol/L (ref 22–32)

## 2021-01-17 LAB — CBC
HCT: 42.5 % (ref 36.0–46.0)
Hemoglobin: 15 g/dL (ref 12.0–15.0)
MCH: 31 pg (ref 26.0–34.0)
MCHC: 35.3 g/dL (ref 30.0–36.0)
MCV: 87.8 fL (ref 80.0–100.0)
Platelets: 222 10*3/uL (ref 150–400)
RBC: 4.84 MIL/uL (ref 3.87–5.11)
RDW: 12.8 % (ref 11.5–15.5)
WBC: 7.6 10*3/uL (ref 4.0–10.5)
nRBC: 0 % (ref 0.0–0.2)

## 2021-01-17 SURGERY — MULTIPLE EXTRACTION WITH ALVEOLOPLASTY
Anesthesia: General | Site: Mouth | Laterality: Bilateral

## 2021-01-17 MED ORDER — POTASSIUM CHLORIDE 10 MEQ/100ML IV SOLN
10.0000 meq | INTRAVENOUS | Status: AC
  Administered 2021-01-17: 10 meq via INTRAVENOUS
  Filled 2021-01-17 (×2): qty 100

## 2021-01-17 MED ORDER — 0.9 % SODIUM CHLORIDE (POUR BTL) OPTIME
TOPICAL | Status: DC | PRN
  Administered 2021-01-17: 1000 mL

## 2021-01-17 MED ORDER — FENTANYL CITRATE (PF) 100 MCG/2ML IJ SOLN
25.0000 ug | INTRAMUSCULAR | Status: DC | PRN
  Administered 2021-01-17 (×4): 25 ug via INTRAVENOUS

## 2021-01-17 MED ORDER — FENTANYL CITRATE (PF) 250 MCG/5ML IJ SOLN
INTRAMUSCULAR | Status: DC | PRN
Start: 2021-01-17 — End: 2021-01-17
  Administered 2021-01-17: 100 ug via INTRAVENOUS
  Administered 2021-01-17: 50 ug via INTRAVENOUS

## 2021-01-17 MED ORDER — ACETAMINOPHEN 325 MG PO TABS
ORAL_TABLET | ORAL | Status: AC
  Filled 2021-01-17: qty 2

## 2021-01-17 MED ORDER — ALBUTEROL SULFATE (2.5 MG/3ML) 0.083% IN NEBU: 2.5000 mg | INHALATION_SOLUTION | Freq: Four times a day (QID) | RESPIRATORY_TRACT | Status: DC | PRN

## 2021-01-17 MED ORDER — LACTATED RINGERS IV SOLN: INTRAVENOUS | Status: DC

## 2021-01-17 MED ORDER — LORAZEPAM 1 MG PO TABS: 1.0000 mg | ORAL_TABLET | Freq: Two times a day (BID) | ORAL | Status: DC | PRN

## 2021-01-17 MED ORDER — PHENYLEPHRINE 40 MCG/ML (10ML) SYRINGE FOR IV PUSH (FOR BLOOD PRESSURE SUPPORT)
PREFILLED_SYRINGE | INTRAVENOUS | Status: AC
  Filled 2021-01-17: qty 10

## 2021-01-17 MED ORDER — ROCURONIUM BROMIDE 100 MG/10ML IV SOLN
INTRAVENOUS | Status: DC | PRN
  Administered 2021-01-17: 60 mg via INTRAVENOUS

## 2021-01-17 MED ORDER — CEFAZOLIN SODIUM-DEXTROSE 2-4 GM/100ML-% IV SOLN
2.0000 g | INTRAVENOUS | Status: AC
  Administered 2021-01-17: 2 g via INTRAVENOUS

## 2021-01-17 MED ORDER — AMOXICILLIN 500 MG PO CAPS
500.0000 mg | ORAL_CAPSULE | Freq: Two times a day (BID) | ORAL | Status: DC
  Administered 2021-01-18: 500 mg via ORAL
  Filled 2021-01-17 (×2): qty 1

## 2021-01-17 MED ORDER — LIDOCAINE-EPINEPHRINE 2 %-1:100000 IJ SOLN
INTRAMUSCULAR | Status: DC | PRN
  Administered 2021-01-17: 7 mL
  Administered 2021-01-17: 11 mL

## 2021-01-17 MED ORDER — CELECOXIB 200 MG PO CAPS
200.0000 mg | ORAL_CAPSULE | Freq: Once | ORAL | Status: AC
  Administered 2021-01-17: 200 mg via ORAL

## 2021-01-17 MED ORDER — DEXAMETHASONE SODIUM PHOSPHATE 10 MG/ML IJ SOLN
INTRAMUSCULAR | Status: DC | PRN
  Administered 2021-01-17: 10 mg via INTRAVENOUS

## 2021-01-17 MED ORDER — OXYMETAZOLINE HCL 0.05 % NA SOLN
NASAL | Status: AC
  Filled 2021-01-17: qty 30

## 2021-01-17 MED ORDER — LIDOCAINE-EPINEPHRINE 2 %-1:100000 IJ SOLN
INTRAMUSCULAR | Status: AC
  Filled 2021-01-17: qty 1

## 2021-01-17 MED ORDER — PROMETHAZINE HCL 25 MG/ML IJ SOLN: 6.2500 mg | INTRAMUSCULAR | Status: DC | PRN

## 2021-01-17 MED ORDER — AMISULPRIDE (ANTIEMETIC) 5 MG/2ML IV SOLN: 10.0000 mg | Freq: Once | INTRAVENOUS | Status: DC | PRN

## 2021-01-17 MED ORDER — NALOXEGOL OXALATE 12.5 MG PO TABS
12.5000 mg | ORAL_TABLET | Freq: Every day | ORAL | Status: DC
  Administered 2021-01-18: 12.5 mg via ORAL
  Filled 2021-01-17 (×2): qty 1

## 2021-01-17 MED ORDER — ONDANSETRON HCL 4 MG/2ML IJ SOLN
INTRAMUSCULAR | Status: DC | PRN
  Administered 2021-01-17: 4 mg via INTRAVENOUS

## 2021-01-17 MED ORDER — ACETAMINOPHEN 500 MG PO TABS
ORAL_TABLET | ORAL | Status: AC
  Filled 2021-01-17: qty 2

## 2021-01-17 MED ORDER — CHLORHEXIDINE GLUCONATE 0.12 % MT SOLN
15.0000 mL | Freq: Once | OROMUCOSAL | Status: AC
  Administered 2021-01-17: 15 mL via OROMUCOSAL

## 2021-01-17 MED ORDER — LIDOCAINE HCL (CARDIAC) PF 100 MG/5ML IV SOSY
PREFILLED_SYRINGE | INTRAVENOUS | Status: DC | PRN
  Administered 2021-01-17: 60 mg via INTRATRACHEAL

## 2021-01-17 MED ORDER — OXYMETAZOLINE HCL 0.05 % NA SOLN
NASAL | Status: AC
  Filled 2021-01-17: qty 60

## 2021-01-17 MED ORDER — ORAL CARE MOUTH RINSE: 15.0000 mL | Freq: Once | OROMUCOSAL | Status: AC

## 2021-01-17 MED ORDER — PANTOPRAZOLE SODIUM 40 MG PO TBEC
40.0000 mg | DELAYED_RELEASE_TABLET | Freq: Every day | ORAL | Status: DC
  Administered 2021-01-18 (×2): 40 mg via ORAL
  Filled 2021-01-17: qty 1

## 2021-01-17 MED ORDER — OXYCODONE-ACETAMINOPHEN 5-325 MG PO TABS: 1.0000 | ORAL_TABLET | Freq: Four times a day (QID) | ORAL | 0 refills | Status: DC | PRN

## 2021-01-17 MED ORDER — CELECOXIB 200 MG PO CAPS
ORAL_CAPSULE | ORAL | Status: AC
  Filled 2021-01-17: qty 1

## 2021-01-17 MED ORDER — POTASSIUM CHLORIDE 10 MEQ/100ML IV SOLN
INTRAVENOUS | Status: DC | PRN
  Administered 2021-01-17: 10 meq via INTRAVENOUS

## 2021-01-17 MED ORDER — MOMETASONE FURO-FORMOTEROL FUM 100-5 MCG/ACT IN AERO
2.0000 | INHALATION_SPRAY | Freq: Two times a day (BID) | RESPIRATORY_TRACT | Status: DC
  Filled 2021-01-17: qty 8.8

## 2021-01-17 MED ORDER — ACETAMINOPHEN 500 MG PO TABS
1000.0000 mg | ORAL_TABLET | Freq: Once | ORAL | Status: AC
  Administered 2021-01-17: 1000 mg via ORAL

## 2021-01-17 MED ORDER — CHLORHEXIDINE GLUCONATE 0.12 % MT SOLN
OROMUCOSAL | Status: AC
  Filled 2021-01-17: qty 15

## 2021-01-17 MED ORDER — FENTANYL CITRATE (PF) 250 MCG/5ML IJ SOLN
INTRAMUSCULAR | Status: AC
  Filled 2021-01-17: qty 5

## 2021-01-17 MED ORDER — POTASSIUM CHLORIDE 20 MEQ PO PACK
40.0000 meq | PACK | ORAL | Status: AC
  Administered 2021-01-18 (×2): 40 meq via ORAL
  Filled 2021-01-17 (×2): qty 2

## 2021-01-17 MED ORDER — FENTANYL CITRATE (PF) 100 MCG/2ML IJ SOLN
INTRAMUSCULAR | Status: AC
  Filled 2021-01-17: qty 2

## 2021-01-17 MED ORDER — SUGAMMADEX SODIUM 200 MG/2ML IV SOLN
INTRAVENOUS | Status: DC | PRN
  Administered 2021-01-17: 317.6 mg via INTRAVENOUS

## 2021-01-17 MED ORDER — ACETAMINOPHEN 325 MG PO TABS
650.0000 mg | ORAL_TABLET | Freq: Four times a day (QID) | ORAL | Status: DC | PRN
  Administered 2021-01-17 – 2021-01-18 (×2): 650 mg via ORAL
  Filled 2021-01-17: qty 2

## 2021-01-17 MED ORDER — LINACLOTIDE 145 MCG PO CAPS: 290.0000 ug | ORAL_CAPSULE | Freq: Every day | ORAL | Status: DC

## 2021-01-17 MED ORDER — AMOXICILLIN 500 MG PO CAPS: 500.0000 mg | ORAL_CAPSULE | Freq: Two times a day (BID) | ORAL | 0 refills | Status: DC

## 2021-01-17 MED ORDER — LEVOTHYROXINE SODIUM 100 MCG PO TABS
100.0000 ug | ORAL_TABLET | Freq: Every day | ORAL | Status: DC
  Administered 2021-01-18: 100 ug via ORAL
  Filled 2021-01-17: qty 1

## 2021-01-17 MED ORDER — PROPOFOL 10 MG/ML IV BOLUS
INTRAVENOUS | Status: DC | PRN
  Administered 2021-01-17: 40 mg via INTRAVENOUS
  Administered 2021-01-17: 160 mg via INTRAVENOUS

## 2021-01-17 MED ORDER — OXYMETAZOLINE HCL 0.05 % NA SOLN
1.0000 | Freq: Two times a day (BID) | NASAL | Status: DC
  Administered 2021-01-17: 1 via NASAL

## 2021-01-17 MED ORDER — HYDROCODONE-ACETAMINOPHEN 5-325 MG PO TABS: 1.0000 | ORAL_TABLET | Freq: Four times a day (QID) | ORAL | Status: DC | PRN

## 2021-01-17 MED ORDER — CEFAZOLIN SODIUM-DEXTROSE 2-4 GM/100ML-% IV SOLN
INTRAVENOUS | Status: AC
  Filled 2021-01-17: qty 100

## 2021-01-17 MED ORDER — ACETAMINOPHEN 650 MG RE SUPP: 650.0000 mg | Freq: Four times a day (QID) | RECTAL | Status: DC | PRN

## 2021-01-17 SURGICAL SUPPLY — 31 items
BAG COUNTER SPONGE SURGICOUNT (BAG) ×2 IMPLANT
BUR CROSS CUT FISSURE 1.6 (BURR) ×2 IMPLANT
BUR EGG ELITE 4.0 (BURR) ×2 IMPLANT
CANISTER SUCT 3000ML PPV (MISCELLANEOUS) ×2 IMPLANT
COVER SURGICAL LIGHT HANDLE (MISCELLANEOUS) ×2 IMPLANT
DRAPE U-SHAPE 76X120 STRL (DRAPES) ×2 IMPLANT
GAUZE PACKING FOLDED 2  STR (GAUZE/BANDAGES/DRESSINGS) ×2
GAUZE PACKING FOLDED 2 STR (GAUZE/BANDAGES/DRESSINGS) ×1 IMPLANT
GLOVE SURG ENC MOIS LTX SZ6.5 (GLOVE) IMPLANT
GLOVE SURG ENC MOIS LTX SZ8 (GLOVE) ×2 IMPLANT
GLOVE SURG UNDER POLY LF SZ6.5 (GLOVE) IMPLANT
GLOVE SURG UNDER POLY LF SZ7 (GLOVE) IMPLANT
GOWN STRL REUS W/ TWL LRG LVL3 (GOWN DISPOSABLE) ×1 IMPLANT
GOWN STRL REUS W/ TWL XL LVL3 (GOWN DISPOSABLE) ×1 IMPLANT
GOWN STRL REUS W/TWL LRG LVL3 (GOWN DISPOSABLE) ×2
GOWN STRL REUS W/TWL XL LVL3 (GOWN DISPOSABLE) ×2
IV NS 1000ML (IV SOLUTION) ×2
IV NS 1000ML BAXH (IV SOLUTION) ×1 IMPLANT
KIT BASIN OR (CUSTOM PROCEDURE TRAY) ×2 IMPLANT
KIT TURNOVER KIT B (KITS) ×2 IMPLANT
NDL HYPO 25GX1X1/2 BEV (NEEDLE) ×2 IMPLANT
NEEDLE HYPO 25GX1X1/2 BEV (NEEDLE) ×6 IMPLANT
NS IRRIG 1000ML POUR BTL (IV SOLUTION) ×2 IMPLANT
PAD ARMBOARD 7.5X6 YLW CONV (MISCELLANEOUS) ×2 IMPLANT
POSITIONER HEAD DONUT 9IN (MISCELLANEOUS) ×2 IMPLANT
SLEEVE IRRIGATION ELITE 7 (MISCELLANEOUS) ×2 IMPLANT
SUT CHROMIC 3 0 PS 2 (SUTURE) ×2 IMPLANT
SYR CONTROL 10ML LL (SYRINGE) ×2 IMPLANT
TRAY ENT MC OR (CUSTOM PROCEDURE TRAY) ×2 IMPLANT
TUBING IRRIGATION (MISCELLANEOUS) ×2 IMPLANT
YANKAUER SUCT BULB TIP NO VENT (SUCTIONS) ×2 IMPLANT

## 2021-01-17 NOTE — Op Note (Signed)
[name], [name] MEDICAL RECORD NO: [id number] ACCOUNT NO: [id number] DATE OF BIRTH: August 06, 1950 FACILITY: [facility] LOCATION: [facility] PHYSICIAN: Gae Bon, DDS  Operative Report   DATE OF PROCEDURE: 01/17/2021  PREOPERATIVE DIAGNOSIS:  Nonrestorable teeth numbers 6, 7, 8, 9, 10, 11, 21, 22, 23, 24, 25, 27, 28, 29 secondary to dental  caries and periodontal disease.  POSTOPERATIVE DIAGNOSIS:  Nonrestorable teeth numbers 6, 7, 8, 9, 10, 11, 21, 22, 23, 24, 25, 27, 28, 29 secondary to dental  caries and periodontal disease.  PROCEDURE:  Extraction teeth numbers 6, 7, 8, 9, 10, 11, 21, 22, 23, 24, 25, 27, 28, 29, alveoloplasty right and left mandible.  SURGEON:  Diona Browner, DDS  ANESTHESIA:  General, nasal intubation.  DESCRIPTION OF PROCEDURE:  The patient was taken to the operating room.  General anesthesia was administered and the nasal endotracheal tube was placed and secured.  The eyes were protected and the patient was draped for surgery.  Timeout was performed.   The posterior pharynx was suctioned and a throat pack was placed.  2% lidocaine, 1:100,000 epinephrine was infiltrated in an inferior alveolar block on the right and left sides with buccal infiltration in the anterior mandible around the teeth to be  removed. In the maxilla,  anesthesia was administered from tooth numbers 5 to 12 area buccally and palatally.  A bite block was then placed on the right side of the mouth, a sweetheart retractor was used to retract the tongue.  A #15 blade was used to  make an incision beginning approximately 0.5 cm proximal to tooth #21 on the alveolar crest and then going around the buccal and lingual sulcus of teeth numbers 21, 22, 23, 24 and 25.  The periosteum was reflected from around these teeth.  The teeth were  elevated with a 301 elevator.  Teeth numbers 25, 24, 23 and 21 were removed with the Ash forceps.  Tooth #22 fractured upon attempted removal, necessitating use of the Stryker  handpiece with a fissure bur to remove circumferential bone from around the  root of tooth #22.  The root was then removed with the Ash forceps. Because of that irregular bone contour around the lower teeth, alveoloplasty was performed using the egg bur.  Then, attention was turned to the maxilla.  The 15 blade was used to make  an incision in the buccal and palatal sulcus around teeth numbers 6, 7, 8, 9, 10, 11.  The teeth were elevated with a 301 elevator and then removed from the mouth with upper Universal forceps.  Then the sockets were curetted.  The tissue was trimmed and  attention was turned to the right mandible.  The sweetheart retractor was repositioned as well as the bite block and the 15 blade was used to make an incision around teeth numbers 28, 27 and 29 in the buccal and lingual sulcus.  The periosteum was  reflected.  Teeth numbers 28 and 29 were elevated and removed with dental forceps.  Tooth #27 fractured upon attempted removal, necessitating removal of circumferential bone around the residual root.  The root was then removed using this Ash forceps.   Alveoloplasty was necessary in the right side because of the irregular bone contour and the bone loss from the circumferential drilling.  Then, the upper and lower denture, which had been previously fabricated by the patient's dentist was placed in the  mouth and found to have good seating.  The extraction sites in the mandible and maxilla were  then irrigated, closed with 3-0 chromic.  Then, the oral cavity was irrigated and suctioned.  Additional local anesthetic was administered and the throat pack  was removed.  The patient was left in the care of anesthesia for extubation and transport to recovery room with plans for discharge home through day surgery.  ESTIMATED BLOOD LOSS:  Minimum.  There were no complications from the surgery.   SHW D: 01/17/2021 6:26:49 pm T: 01/17/2021 11:05:00 pm  JOB: [id number]/ [id number]

## 2021-01-17 NOTE — Anesthesia Postprocedure Evaluation (Signed)
Anesthesia Post Note  Patient: [name]  Procedure(s) Performed: MULTIPLE EXTRACTION WITH ALVEOLOPLASTY (Bilateral: Mouth)     Patient location during evaluation: PACU Anesthesia Type: General Level of consciousness: awake and alert Pain management: pain level controlled Vital Signs Assessment: post-procedure vital signs reviewed and stable Respiratory status: spontaneous breathing, nonlabored ventilation and respiratory function stable Cardiovascular status: blood pressure returned to baseline and stable Postop Assessment: no apparent nausea or vomiting Anesthetic complications: no   No notable events documented.  Last Vitals:  Vitals:   01/17/21 2036 01/17/21 2102  BP: 128/85 (!) 142/80  Pulse: 94 100  Resp: 15 20  Temp:    SpO2: 95% 95%    Last Pain:  Vitals:   01/17/21 2102  TempSrc:   PainSc: 5                  Amaziah Ghosh,W. EDMOND

## 2021-01-17 NOTE — Anesthesia Procedure Notes (Signed)
Procedure Name: Intubation Date/Time: 01/17/2021 5:36 PM Performed by: Annamary Carolin, CRNA Pre-anesthesia Checklist: Patient identified, Emergency Drugs available, Suction available and Patient being monitored Patient Re-evaluated:Patient Re-evaluated prior to induction Oxygen Delivery Method: Circle System Utilized Preoxygenation: Pre-oxygenation with 100% oxygen Induction Type: IV induction Ventilation: Mask ventilation without difficulty Nasal Tubes: Nasal Rae, Nasal prep performed and Left Tube size: 7.0 mm Number of attempts: 1 Placement Confirmation: ETT inserted through vocal cords under direct vision, positive ETCO2 and breath sounds checked- equal and bilateral Tube secured with: Tape Dental Injury: Teeth and Oropharynx as per pre-operative assessment  Comments: BSEB

## 2021-01-17 NOTE — Transfer of Care (Signed)
Immediate Anesthesia Transfer of Care Note  Patient: [name]  Procedure(s) Performed: MULTIPLE EXTRACTION WITH ALVEOLOPLASTY (Bilateral: Mouth)  Patient Location: PACU  Anesthesia Type:General  Level of Consciousness: awake, alert , oriented and patient cooperative  Airway & Oxygen Therapy: Patient Spontanous Breathing and Patient connected to face mask oxygen  Post-op Assessment: Report given to RN, Post -op Vital signs reviewed and stable and Patient moving all extremities X 4  Post vital signs: Reviewed and stable  Last Vitals:  Vitals Value Taken Time  BP 160/117 01/17/21 1829  Temp    Pulse 97 01/17/21 1831  Resp 24 01/17/21 1831  SpO2 97 % 01/17/21 1831  Vitals shown include unvalidated device data.  Last Pain:  Vitals:   01/17/21 1359  TempSrc: Oral  PainSc:       Patients Stated Pain Goal: 2 (60/10/93 2355)  Complications: No notable events documented.

## 2021-01-17 NOTE — Op Note (Signed)
01/17/2021  6:22 PM  PATIENT:  Clayborn Bigness  71 y.o. female  PRE-OPERATIVE DIAGNOSIS:  NONRESTORABLE TEETH # 6, 7, 8, 9, 10, 11, 21, 22, 23, 24, 25, 27, 28, 29  POST-OPERATIVE DIAGNOSIS:  SAME  PROCEDURE:  Procedure(s): MULTIPLE EXTRACTION TEETH # 6, 7, 8, 9, 10, 11, 21, 22, 23, 24, 25, 27, 28, 29 WITH ALVEOLOPLASTY RIGHT AND LEFT MANDIBLE  SURGEON:  Surgeon(s): Diona Browner, DMD  ANESTHESIA:   local and general  EBL:  minimal  DRAINS: none   SPECIMEN:  No Specimen  COUNTS:  YES  PLAN OF CARE: Discharge to home after PACU  PATIENT DISPOSITION:  PACU - hemodynamically stable.   PROCEDURE DETAILS: Dictation # [id number]  Gae Bon, DMD 01/17/2021 6:22 PM
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
## 2021-01-17 NOTE — H&P (Signed)
H&P documentation  -History and Physical Reviewed  -Patient has been re-examined  -No change in the plan of care  Anjelica Gorniak  

## 2021-01-17 NOTE — Progress Notes (Signed)
Amanda from Lab called with a critical potassium value of 2.7.  Verified 2.7 potassium.  Called the OR and MD to inform them of potassium value.

## 2021-01-17 NOTE — Anesthesia Preprocedure Evaluation (Addendum)
Anesthesia Evaluation  Patient identified by MRN, date of birth, ID band Patient awake    Reviewed: Allergy & Precautions, NPO status , Patient's Chart, lab work & pertinent test results  History of Anesthesia Complications Negative for: history of anesthetic complications  Airway Mallampati: III  TM Distance: <3 FB Neck ROM: limited    Dental  (+) Chipped, Poor Dentition, Missing, Dental Advisory Given   Pulmonary shortness of breath, asthma , sleep apnea and Continuous Positive Airway Pressure Ventilation , COPD,    Pulmonary exam normal        Cardiovascular Exercise Tolerance: Good hypertension, (-) angina(-) Past MI Normal cardiovascular exam+ dysrhythmias   [facility] Cardiologist Dr. Dot Lanes signed a letter of clearance for this procedure. Overall low risk stress test 10/2020 at [facility]. Normal coronaries by [facility] 2016. Awaiting 09/19/20 EKG done 09/19/20 per PCP Dr. Noberto Retort. Nuclear stress test 10/28/20 [facility]): Impressions:  - Probably normal myocardial perfusion study  - There is a small in size, subtle in severity, nearly completely  reversible defect involving the apical segment. This is consistent with  probable artifact and less likely due to subtle ischemia.  - Post stress: Global systolic function is hyperdynamic. The ejection  fraction was greater than 65%.  - Cardiologist Dr. Dot Lanes reviewed and wrote, "Ms [place] stress test was low risk with a subtle reversible apical defect. Although she has risk factors for CAD, I do not think her stress test indicates evidence of significant obstructive CAD.    Neuro/Psych  Headaches, PSYCHIATRIC DISORDERS  Neuromuscular disease negative neurological ROS     GI/Hepatic Neg liver ROS, hiatal hernia, GERD  Medicated and Controlled,  Endo/Other  Hypothyroidism   Renal/GU      Musculoskeletal  (+) Arthritis ,   Abdominal   Peds  Hematology negative hematology ROS (+) anemia  ,   Anesthesia Other Findings     Reproductive/Obstetrics negative OB ROS                           Anesthesia Physical  Anesthesia Plan  ASA: 3  Anesthesia Plan: General   Post-op Pain Management: GA combined w/ [facility] for post-op pain and Tylenol PO (pre-op) and Celebrex PO (pre-op)   Induction: Intravenous  PONV Risk Score and Plan: 3 and Dexamethasone, Ondansetron, Midazolam and Treatment may vary due to age or medical condition  Airway Management Planned: Nasal ETT and Mask  Additional Equipment: None  Intra-op Plan:   Post-operative Plan: Extubation in OR  Informed Consent: I have reviewed the patients History and Physical, chart, labs and discussed the procedure including the risks, benefits and alternatives for the proposed anesthesia with the patient or authorized representative who has indicated his/her understanding and acceptance.     Dental Advisory Given and Dental advisory given  Plan Discussed with: Anesthesiologist and CRNA  Anesthesia Plan Comments:      Anesthesia Quick Evaluation

## 2021-01-18 ENCOUNTER — Other Ambulatory Visit: Payer: Self-pay

## 2021-01-18 ENCOUNTER — Encounter (HOSPITAL_COMMUNITY): Payer: Self-pay | Admitting: Oral Surgery

## 2021-01-18 DIAGNOSIS — F419 Anxiety disorder, unspecified: Secondary | ICD-10-CM | POA: Diagnosis not present

## 2021-01-18 DIAGNOSIS — K029 Dental caries, unspecified: Secondary | ICD-10-CM | POA: Diagnosis not present

## 2021-01-18 LAB — BASIC METABOLIC PANEL
Anion gap: 10 (ref 5–15)
BUN: 18 mg/dL (ref 8–23)
CO2: 26 mmol/L (ref 22–32)
Calcium: 9.2 mg/dL (ref 8.9–10.3)
Chloride: 103 mmol/L (ref 98–111)
Creatinine, Ser: 0.81 mg/dL (ref 0.44–1.00)
GFR, Estimated: >60 mL/min (ref >60)
Glucose, Bld: 148 mg/dL — ABNORMAL HIGH (ref 70–99)
Potassium: 4.5 mmol/L (ref 3.5–5.1)
Sodium: 139 mmol/L (ref 135–145)

## 2021-01-18 LAB — HIV ANTIBODY (ROUTINE TESTING W REFLEX): HIV Screen 4th Generation wRfx: NONREACTIVE

## 2021-01-18 LAB — MAGNESIUM: Magnesium: 1.9 mg/dL (ref 1.7–2.4)

## 2021-01-18 MED ORDER — ENSURE ENLIVE PO LIQD: 237.0000 mL | Freq: Two times a day (BID) | ORAL | Status: DC

## 2021-01-18 MED ORDER — OXYCODONE HCL 5 MG PO TABS
5.0000 mg | ORAL_TABLET | Freq: Four times a day (QID) | ORAL | Status: DC | PRN
  Administered 2021-01-18 (×2): 5 mg via ORAL
  Filled 2021-01-18 (×2): qty 1

## 2021-01-18 NOTE — Discharge Summary (Signed)
Physician Discharge Summary  [name] TIW:[id number] DOB: [birth year] DOA: 01/17/2021  PCP: [name], MD  Admit date: 01/17/2021 Discharge date: 01/18/2021  Admitted From: home Disposition:  home  Recommendations for Outpatient Follow-up:  Follow up with PCP and dentist  in 1-2 weeks  Home Health:no  Equipment/Devices: none  Discharge Condition: Stable Code Status:   Code Status: Full Code Diet recommendation:  Diet Order             DIET SOFT Room service appropriate? Yes; Fluid consistency: Thin  Diet effective now           Diet - low sodium heart healthy                    Brief/Interim Summary:  70 y.o. female with medical history significant of IBS with chronic constipation/opioid-induced constipation, GERD, fatty liver, colon polyps, asthma, obesity, COPD, sleep apnea, hypothyroidism, arthritis, hypertension, hyperlipidemia, depression, anxiety, panic attacks.  She was taken to the OR today by Dr. Hoyt Koch for extraction of 14 teeth and alveoloplasty.  Patient was supposed to be discharged after her surgery but while in the PACU, she appeared anxious but was hemodynamically stable.  Dr. Hoyt Koch requested hospital admission for observation.  He recommends continuing amoxicillin and Percocet, soft diet okay.  He will see the patient in the morning.  Labs done this afternoon showing hypokalemia (potassium 2.7).  Patient was given IV potassium chloride 10 mEq.  Repeat labs showing potassium 2.9.   PACU RN informed me that patient had a panic attack earlier.  She was complaining of a choking sensation and not being able to breathe.  She was very anxious about going home.  She was evaluated by anesthesiology at that time and vital signs were stable.  At the time of my evaluation, patient's vital signs were stable, not hypoxic.  Appeared anxious.  Stated earlier she felt like she could not breathe or swallow but feeling better now.  Stated she was in her usual state of  health prior to her surgery today.  She was not having any fevers, cough, shortness of breath, or chest pain.  She felt nauseous after taking a pain medication earlier today but no longer nauseous.  No vomiting, abdominal pain, or diarrhea.  Patient states she was taking a medication for anxiety a long time ago.  Denies history of CAD or blood clots Patient was monitored overnight her hypokalemia was repleted and normalized. This morning she is alert awake oriented x3, tolerating soft diet, Seen by her oral surgeon okay for discharge home.  Discharge Diagnoses:   Episode of anxiety/panic attack in the PACU monitored overnight, no recurrence.  Vitals stable.  Okay for discharge  Hypokalemia repleted resume home potassium supplement  S/P oral surgery with extraction of 14 teeth alveoloplasty-see already has prescription sent for amoxicillin and Percocet and she is on soft diet.  Cleared by her surgeon this morning  IBS continue home meds GERD continue PPI Asthma/?  COPD: Compensated continue home inhalers.  On room air. Hypothyroidism on home Synthroid  Hypertension blood pressure stable not on meds  Consults: Oral surgery   Subjective: AAOX3, doing well this am wants to go home  Discharge Exam: Vitals:   01/18/21 0600 01/18/21 0816  BP: 126/86 126/86  Pulse: 78 64  Resp: 18 18  Temp: 98.5 F (36.9 C) 98.2 F (36.8 C)  SpO2: 92% 91%   General: Pt is alert, awake, not in acute distress Cardiovascular: RRR, S1/S2 +, no  rubs, no gallops Respiratory: CTA bilaterally, no wheezing, no rhonchi Abdominal: Soft, NT, ND, bowel sounds + Extremities: no edema, no cyanosis  Discharge Instructions  Discharge Instructions     Call MD for:  difficulty breathing, headache or visual disturbances   Complete by: As directed    Call MD for:  persistant nausea and vomiting   Complete by: As directed    Call MD for:  severe uncontrolled pain   Complete by: As directed    Call MD for:   temperature >100.4   Complete by: As directed    Diet - low sodium heart healthy   Complete by: As directed    Discharge instructions   Complete by: As directed    Ice to affected area for 2-3 days. Some bleeding is normal. Bite on moistened gauze with firm pressure for 30 minutes and change as needed. When bleeding has stopped, remove gauze from mouth.  Soft diet, advance as tolerated. Warm salt water mouth rinses 4-5 times per day starting the day after surgery. No smoking or vaping for 2 weeks. For problems, call [phone].   Discharge instructions   Complete by: As directed    Please call call MD or return to ER for similar or worsening recurring problem that brought you to hospital or if any fever,nausea/vomiting,abdominal pain, uncontrolled pain, chest pain,  shortness of breath or any other alarming symptoms.  Please follow-up your doctor as instructed in a week time and call the office for appointment.  Please avoid alcohol, smoking, or any other illicit substance and maintain healthy habits including taking your regular medications as prescribed.  You were cared for by a hospitalist during your hospital stay. If you have any questions about your discharge medications or the care you received while you were in the hospital after you are discharged, you can call the unit and ask to speak with the hospitalist on call if the hospitalist that took care of you is not available.  Once you are discharged, your primary care physician will handle any further medical issues. Please note that NO REFILLS for any discharge medications will be authorized once you are discharged, as it is imperative that you return to your primary care physician (or establish a relationship with a primary care physician if you do not have one) for your aftercare needs so that they can reassess your need for medications and monitor your lab values   Increase activity slowly   Complete by: As directed    No wound  care   Complete by: As directed       Allergies as of 01/18/2021       Reactions   Bee Venom Anaphylaxis   Wasp Venom Anaphylaxis   Codeine Nausea Only   Pt reported nausea to nurse when asked about allergy on 10/27/14   Hydrocodone-acetaminophen Other (See Comments)   Halluninations   Mango Flavor Hives   Oxycodone Itching   Procaine Hcl Nausea Only   Ineffective    Tramadol Nausea And Vomiting   Latex Rash        Medication List     TAKE these medications    albuterol 108 (90 Base) MCG/ACT inhaler Commonly known as: VENTOLIN HFA Inhale 2 puffs into the lungs every 6 (six) hours as needed (COPD).   amoxicillin 500 MG capsule Commonly known as: AMOXIL Take 1 capsule (500 mg total) by mouth 2 (two) times daily.   Bariatric Multivitamins/Iron Caps Take 1 tablet by mouth daily. 45 mg  iron   budesonide-formoterol 80-4.5 MCG/ACT inhaler Commonly known as: SYMBICORT Inhale 2 puffs into the lungs daily as needed for shortness of breath.   clobetasol ointment 0.05 % Commonly known as: TEMOVATE Apply 1 application topically daily as needed for sore throat.   diclofenac Sodium 1 % Gel Commonly known as: VOLTAREN Apply 2 g topically daily as needed for pain.   diphenhydrAMINE 25 MG tablet Commonly known as: BENADRYL Take 25 mg by mouth every 4 (four) hours as needed for itching.   EPINEPHrine 0.3 mg/0.3 mL Soaj injection Commonly known as: EPI-PEN Inject 0.3 mg into the muscle as needed.   levothyroxine 100 MCG tablet Commonly known as: SYNTHROID Take 100 mcg by mouth daily before breakfast.   linaclotide 290 MCG Caps capsule Commonly known as: Linzess Take 1 capsule (290 mcg total) by mouth daily before breakfast.   naloxegol oxalate 12.5 MG Tabs tablet Commonly known as: Movantik Take 1 tablet (12.5 mg total) by mouth daily.   oxyCODONE 5 MG immediate release tablet Commonly known as: Roxicodone Take 1 tablet (5 mg total) by mouth every 4 (four) hours as  needed for severe pain.   oxyCODONE-acetaminophen 5-325 MG tablet Commonly known as: Percocet Take 1 tablet by mouth every 6 (six) hours as needed.   pantoprazole 40 MG tablet Commonly known as: PROTONIX Take 1 tablet (40 mg total) by mouth daily.   Plenvu 140 g Solr Generic drug: PEG-KCl-NaCl-NaSulf-Na Asc-C Take 1 kit by mouth as directed. Use coupon: BIN: [id number] PNC: CNRX Group: [id number] ID: [id number]   potassium chloride SA 20 MEQ tablet Commonly known as: KLOR-CON M Take 40 mEq by mouth daily.   promethazine 25 MG tablet Commonly known as: PHENERGAN Take 25 mg by mouth every 4 (four) hours as needed for nausea or vomiting.        Allergies  Allergen Reactions   Bee Venom Anaphylaxis   Wasp Venom Anaphylaxis   Codeine Nausea Only    Pt reported nausea to nurse when asked about allergy on 10/27/14   Hydrocodone-Acetaminophen Other (See Comments)    Halluninations   Mango Flavor Hives   Oxycodone Itching   Procaine Hcl Nausea Only    Ineffective    Tramadol Nausea And Vomiting   Latex Rash    The results of significant diagnostics from this hospitalization (including imaging, microbiology, ancillary and laboratory) are listed below for reference.    Microbiology: No results found for this or any previous visit (from the past 240 hour(s)).  Procedures/Studies: No results found.  Labs: BNP (last 3 results) No results for input(s): BNP in the last 8760 hours. Basic Metabolic Panel: Recent Labs  Lab 01/17/21 1323 01/17/21 1653 01/18/21 0645  NA 140 141 139  K 2.7* 2.9* 4.5  CL 100 101 103  CO2 29  --  26  GLUCOSE 93 97 148*  BUN _0 CREATININE 0.83 0.80 0.81  CALCIUM 8.8*  --  9.2  MG  --   --  1.9   Liver Function Tests: No results for input(s): AST, ALT, ALKPHOS, BILITOT, PROT, ALBUMIN in the last 168 hours. No results for input(s): LIPASE, AMYLASE in the last 168 hours. No results for input(s): AMMONIA in the last 168  hours. CBC: Recent Labs  Lab 01/17/21 1323 01/17/21 1653  WBC 7.6  --   HGB 15.0 13.3  HCT 42.5 39.0  MCV 87.8  --   PLT 222  --    Cardiac Enzymes: No results for  input(s): CKTOTAL, CKMB, CKMBINDEX, TROPONINI in the last 168 hours. BNP: Invalid input(s): POCBNP CBG: No results for input(s): GLUCAP in the last 168 hours. D-Dimer No results for input(s): DDIMER in the last 72 hours. Hgb A1c No results for input(s): HGBA1C in the last 72 hours. Lipid Profile No results for input(s): CHOL, HDL, LDLCALC, TRIG, CHOLHDL, LDLDIRECT in the last 72 hours. Thyroid function studies No results for input(s): TSH, T4TOTAL, T3FREE, THYROIDAB in the last 72 hours.  Invalid input(s): FREET3 Anemia work up No results for input(s): VITAMINB12, FOLATE, FERRITIN, TIBC, IRON, RETICCTPCT in the last 72 hours. Urinalysis    Component Value Date/Time   COLORURINE YELLOW 06/22/2015 1008   APPEARANCEUR CLOUDY (A) 06/22/2015 1008   LABSPEC 1.025 06/22/2015 1008   PHURINE 5.5 06/22/2015 1008   GLUCOSEU 100 (A) 06/22/2015 1008   HGBUR NEGATIVE 06/22/2015 1008   BILIRUBINUR NEGATIVE 06/22/2015 1008   KETONESUR NEGATIVE 06/22/2015 1008   PROTEINUR NEGATIVE 06/22/2015 1008   NITRITE NEGATIVE 06/22/2015 1008   LEUKOCYTESUR NEGATIVE 06/22/2015 1008   Sepsis Labs Invalid input(s): PROCALCITONIN,  WBC,  LACTICIDVEN Microbiology No results found for this or any previous visit (from the past 240 hour(s)).   Time coordinating discharge: 25 minutes  SIGNED: Antonieta Pert, MD  [facility] 01/18/2021, 12:53 PM  If 7PM-7AM, please contact night-coverage [email or web address]

## 2021-01-18 NOTE — Progress Notes (Signed)
[name] PROGRESS NOTE:   SUBJECTIVE: Resting comfortably. Pain 4/10. Only took Tylenol last night.  OBJECTIVE:  Vitals: Blood pressure 126/86, pulse 78, temperature 98.5 F (36.9 C), temperature source Oral, resp. rate 18, height 5' (1.524 m), weight 80.1 kg, SpO2 92 %. Lab results: Results for orders placed or performed during the hospital encounter of 01/17/21 (from the past 24 hour(s))  CBC per protocol     Status: None   Collection Time: 01/17/21  1:23 PM  Result Value Ref Range   WBC 7.6 4.0 - 10.5 K/uL   RBC 4.84 3.87 - 5.11 MIL/uL   Hemoglobin 15.0 12.0 - 15.0 g/dL   HCT 42.5 36.0 - 46.0 %   MCV 87.8 80.0 - 100.0 fL   MCH 31.0 26.0 - 34.0 pg   MCHC 35.3 30.0 - 36.0 g/dL   RDW 12.8 11.5 - 15.5 %   Platelets 222 150 - 400 K/uL   nRBC 0.0 0.0 - 0.2 %  Basic metabolic panel per protocol     Status: Abnormal   Collection Time: 01/17/21  1:23 PM  Result Value Ref Range   Sodium 140 135 - 145 mmol/L   Potassium 2.7 (LL) 3.5 - 5.1 mmol/L   Chloride 100 98 - 111 mmol/L   CO2 29 22 - 32 mmol/L   Glucose, Bld 93 70 - 99 mg/dL   BUN 18 8 - 23 mg/dL   Creatinine, Ser 0.83 0.44 - 1.00 mg/dL   Calcium 8.8 (L) 8.9 - 10.3 mg/dL   GFR, Estimated >60 >60 mL/min   Anion gap 11 5 - 15  I-STAT, chem 8     Status: Abnormal   Collection Time: 01/17/21  4:53 PM  Result Value Ref Range   Sodium 141 135 - 145 mmol/L   Potassium 2.9 (L) 3.5 - 5.1 mmol/L   Chloride 101 98 - 111 mmol/L   BUN 20 8 - 23 mg/dL   Creatinine, Ser 0.80 0.44 - 1.00 mg/dL   Glucose, Bld 97 70 - 99 mg/dL   Calcium, Ion 1.00 (L) 1.15 - 1.40 mmol/L   TCO2 30 22 - 32 mmol/L   Hemoglobin 13.3 12.0 - 15.0 g/dL   HCT 39.0 36.0 - 46.0 %   Radiology Results: No results found. General appearance: alert, cooperative, and no distress Head: Normocephalic, without obvious abnormality, atraumatic Eyes: negative Nose: Nares normal. Septum midline. Mucosa normal. No drainage or sinus tenderness. Throat: Hemostatic,  Minimal edema. Wearing upper denture without difficulty. Sutures intact lower alveolar ridge. Neck: no adenopathy and supple, symmetrical, trachea midline  ASSESSMENT: Stable s/p multiple extractions, alveoloplasty.   PLAN: Stable for D/C from oral surgery standpoint. Hospitalist admission help appreciated.   Diona Browner 01/18/2021

## 2021-01-18 NOTE — Progress Notes (Signed)
Patient transferred from PACU to 5W20. Patient is alert and oriented to person, place, time, and situation. Telemetry monitoring enabled, vital signs taken, and IV assessed for patency. Skin checked with [facility]. Fall precautions initiated. Patient bed in the locked, lowest position. Non-slip socks in place and bed alarm on. Call bell is within reach. Patient knows to call for assistance prior to getting up and demonstrates use. Patient is laying comfortably in bed.

## 2021-01-24 ENCOUNTER — Ambulatory Visit: Payer: Medicare Other | Admitting: Occupational Therapy

## 2021-01-27 ENCOUNTER — Encounter: Payer: Self-pay | Admitting: Internal Medicine

## 2021-01-30 NOTE — Telephone Encounter (Signed)
Ensure that she is still using narcotic pain medications and if so increase Movantik to 25 mg daily  This is in addition to the Pine which she remains on for constipation  After a week to 10 days she should let me know if the higher dose of Movantik is helping

## 2021-02-02 ENCOUNTER — Other Ambulatory Visit: Payer: Self-pay

## 2021-02-02 ENCOUNTER — Ambulatory Visit: Payer: Medicare Other | Attending: Orthopedic Surgery | Admitting: Occupational Therapy

## 2021-02-02 DIAGNOSIS — M25632 Stiffness of left wrist, not elsewhere classified: Secondary | ICD-10-CM

## 2021-02-02 DIAGNOSIS — M25642 Stiffness of left hand, not elsewhere classified: Secondary | ICD-10-CM | POA: Diagnosis present

## 2021-02-02 DIAGNOSIS — M79602 Pain in left arm: Secondary | ICD-10-CM

## 2021-02-02 DIAGNOSIS — M6281 Muscle weakness (generalized): Secondary | ICD-10-CM | POA: Diagnosis not present

## 2021-02-02 MED ORDER — NALOXEGOL OXALATE 25 MG PO TABS: 25.0000 mg | ORAL_TABLET | Freq: Every day | ORAL | 3 refills | Status: DC
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
## 2021-02-28 ENCOUNTER — Ambulatory Visit: Payer: Medicaid Other | Admitting: Occupational Therapy

## 2021-03-16 ENCOUNTER — Encounter: Payer: Medicare Other | Admitting: Internal Medicine

## 2021-03-16 ENCOUNTER — Other Ambulatory Visit: Payer: Self-pay | Admitting: Internal Medicine

## 2021-03-20 ENCOUNTER — Telehealth: Payer: Self-pay | Admitting: Internal Medicine

## 2021-03-20 NOTE — Telephone Encounter (Signed)
Hey Dr. [name],   Patient called in to cancel procedure for 2/2 for family problem with transportation. States she will call back to reschedule.   Thank you

## 2021-03-23 ENCOUNTER — Encounter (HOSPITAL_COMMUNITY): Payer: Self-pay | Admitting: Oral Surgery

## 2021-03-23 ENCOUNTER — Other Ambulatory Visit: Payer: Self-pay

## 2021-03-23 ENCOUNTER — Encounter: Payer: Medicare Other | Admitting: Internal Medicine
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
## 2021-03-23 NOTE — Progress Notes (Addendum)
[name] denies chest pain or shortness of breath. Patient denies having any s/s of Covid in her household.  [name] was in the present of someone 04/17/21, that tested positive for Covid, they both were both visiting the same patient in the hospital.  [name] denies any s/s of Covid.  [name] tested positive for sleep apnea in the past, patient cannot wear the mask or the pads, she does not use th e CPAP.  PCP is Dr. [name]. Pulmonologist Dr. Jeronimo Greaves, does not see any more. Cardiologist  is Dr. Arma Heading- patient was evaluated for chest discomfort in 2022 test were done, patient reports that she was released  drom cardiologist care.  I instructed [name]  to shower with antibiotic soap, if it is available.  Dry off with a clean towel. Do not put lotion, powder, cologne or deodorant or makeup.No jewelry or piercings. Men may shave their face and neck. Woman should not shave. No nail polish, artificial or acrylic nails. Wear clean clothes, brush your teeth. Glasses, contact lens,dentures or partials may not be worn in the OR. If you need to wear them, please bring a case for glasses, do not wear contacts or bring a case, the hospital does not have contact cases, dentures or partials will have to be removed , make sure they are clean, we will provide a denture cup to put them in. You will need some one to drive you home and a responsible person over the age of 44 to stay with you for the first 24 hours after surgery.

## 2021-03-24 ENCOUNTER — Ambulatory Visit (HOSPITAL_COMMUNITY): Payer: Medicare Other | Admitting: Anesthesiology

## 2021-03-24 ENCOUNTER — Ambulatory Visit (HOSPITAL_COMMUNITY)
Admission: RE | Admit: 2021-03-24 | Discharge: 2021-03-24 | Disposition: A | Discharge status: Home / Self Care | Payer: Medicare Other | Attending: Oral Surgery | Admitting: Oral Surgery

## 2021-03-24 ENCOUNTER — Encounter (HOSPITAL_COMMUNITY): Payer: Self-pay | Admitting: Oral Surgery

## 2021-03-24 ENCOUNTER — Encounter (HOSPITAL_COMMUNITY)
Admission: RE | Disposition: A | Discharge status: Home / Self Care | Payer: Self-pay | Source: Home / Self Care | Attending: Oral Surgery

## 2021-03-24 DIAGNOSIS — M898X8 Other specified disorders of bone, other site: Secondary | ICD-10-CM | POA: Diagnosis present

## 2021-03-24 DIAGNOSIS — Z791 Long term (current) use of non-steroidal anti-inflammatories (NSAID): Secondary | ICD-10-CM | POA: Diagnosis not present

## 2021-03-24 DIAGNOSIS — G4730 Sleep apnea, unspecified: Secondary | ICD-10-CM | POA: Insufficient documentation

## 2021-03-24 DIAGNOSIS — E785 Hyperlipidemia, unspecified: Secondary | ICD-10-CM | POA: Insufficient documentation

## 2021-03-24 DIAGNOSIS — M1990 Unspecified osteoarthritis, unspecified site: Secondary | ICD-10-CM | POA: Insufficient documentation

## 2021-03-24 DIAGNOSIS — Z79899 Other long term (current) drug therapy: Secondary | ICD-10-CM | POA: Diagnosis not present

## 2021-03-24 DIAGNOSIS — Z7951 Long term (current) use of inhaled steroids: Secondary | ICD-10-CM | POA: Diagnosis not present

## 2021-03-24 DIAGNOSIS — J449 Chronic obstructive pulmonary disease, unspecified: Secondary | ICD-10-CM | POA: Insufficient documentation

## 2021-03-24 DIAGNOSIS — K219 Gastro-esophageal reflux disease without esophagitis: Secondary | ICD-10-CM | POA: Diagnosis not present

## 2021-03-24 DIAGNOSIS — I10 Essential (primary) hypertension: Secondary | ICD-10-CM | POA: Diagnosis not present

## 2021-03-24 DIAGNOSIS — E039 Hypothyroidism, unspecified: Secondary | ICD-10-CM | POA: Diagnosis not present

## 2021-03-24 DIAGNOSIS — Z8616 Personal history of COVID-19: Secondary | ICD-10-CM | POA: Insufficient documentation

## 2021-03-24 HISTORY — DX: Other constipation: K59.09

## 2021-03-24 HISTORY — DX: Headache, unspecified: R51.9

## 2021-03-24 HISTORY — PX: TOOTH EXTRACTION: SHX859

## 2021-03-24 HISTORY — DX: Fibromyalgia: M79.7

## 2021-03-24 LAB — BASIC METABOLIC PANEL
Anion gap: 7 (ref 5–15)
BUN: 20 mg/dL (ref 8–23)
CO2: 29 mmol/L (ref 22–32)
Calcium: 9.3 mg/dL (ref 8.9–10.3)
Chloride: 105 mmol/L (ref 98–111)
Creatinine, Ser: 0.81 mg/dL (ref 0.44–1.00)
GFR, Estimated: >60 mL/min (ref >60)
Glucose, Bld: 99 mg/dL (ref 70–99)
Potassium: 4.1 mmol/L (ref 3.5–5.1)
Sodium: 141 mmol/L (ref 135–145)

## 2021-03-24 SURGERY — DENTAL RESTORATION/EXTRACTIONS
Anesthesia: General

## 2021-03-24 MED ORDER — OXYCODONE-ACETAMINOPHEN 5-325 MG PO TABS
1.0000 | ORAL_TABLET | ORAL | Status: DC | PRN
  Administered 2021-03-24: 1 via ORAL

## 2021-03-24 MED ORDER — ONDANSETRON HCL 4 MG/2ML IJ SOLN
INTRAMUSCULAR | Status: AC
  Filled 2021-03-24: qty 2

## 2021-03-24 MED ORDER — DEXAMETHASONE SODIUM PHOSPHATE 10 MG/ML IJ SOLN
INTRAMUSCULAR | Status: DC | PRN
  Administered 2021-03-24: 10 mg via INTRAVENOUS

## 2021-03-24 MED ORDER — ACETAMINOPHEN 10 MG/ML IV SOLN: 1000.0000 mg | Freq: Once | INTRAVENOUS | Status: DC | PRN

## 2021-03-24 MED ORDER — ROCURONIUM BROMIDE 10 MG/ML (PF) SYRINGE
PREFILLED_SYRINGE | INTRAVENOUS | Status: AC
  Filled 2021-03-24: qty 10

## 2021-03-24 MED ORDER — PHENYLEPHRINE 40 MCG/ML (10ML) SYRINGE FOR IV PUSH (FOR BLOOD PRESSURE SUPPORT)
PREFILLED_SYRINGE | INTRAVENOUS | Status: DC | PRN
  Administered 2021-03-24: 80 ug via INTRAVENOUS
  Administered 2021-03-24: 40 ug via INTRAVENOUS

## 2021-03-24 MED ORDER — OXYCODONE-ACETAMINOPHEN 5-325 MG PO TABS
ORAL_TABLET | ORAL | Status: AC
  Filled 2021-03-24: qty 1

## 2021-03-24 MED ORDER — DEXAMETHASONE SODIUM PHOSPHATE 10 MG/ML IJ SOLN
INTRAMUSCULAR | Status: AC
  Filled 2021-03-24: qty 1

## 2021-03-24 MED ORDER — OXYMETAZOLINE HCL 0.05 % NA SOLN
NASAL | Status: AC
  Filled 2021-03-24: qty 30

## 2021-03-24 MED ORDER — SODIUM CHLORIDE 0.9 % IR SOLN
Status: DC | PRN
  Administered 2021-03-24: 1000 mL

## 2021-03-24 MED ORDER — FENTANYL CITRATE (PF) 100 MCG/2ML IJ SOLN
INTRAMUSCULAR | Status: AC
  Filled 2021-03-24: qty 2

## 2021-03-24 MED ORDER — FENTANYL CITRATE (PF) 250 MCG/5ML IJ SOLN
INTRAMUSCULAR | Status: DC | PRN
Start: 2021-03-24 — End: 2021-03-24
  Administered 2021-03-24: 100 ug via INTRAVENOUS
  Administered 2021-03-24: 50 ug via INTRAVENOUS

## 2021-03-24 MED ORDER — PROPOFOL 10 MG/ML IV BOLUS
INTRAVENOUS | Status: DC | PRN
  Administered 2021-03-24: 140 mg via INTRAVENOUS

## 2021-03-24 MED ORDER — PROPOFOL 10 MG/ML IV BOLUS
INTRAVENOUS | Status: AC
  Filled 2021-03-24: qty 20

## 2021-03-24 MED ORDER — SUGAMMADEX SODIUM 200 MG/2ML IV SOLN
INTRAVENOUS | Status: DC | PRN
  Administered 2021-03-24: 400 mg via INTRAVENOUS

## 2021-03-24 MED ORDER — ORAL CARE MOUTH RINSE: 15.0000 mL | Freq: Once | OROMUCOSAL | Status: AC

## 2021-03-24 MED ORDER — OXYCODONE-ACETAMINOPHEN 5-325 MG PO TABS: 1.0000 | ORAL_TABLET | ORAL | 0 refills | Status: DC | PRN

## 2021-03-24 MED ORDER — LIDOCAINE 2% (20 MG/ML) 5 ML SYRINGE
INTRAMUSCULAR | Status: DC | PRN
Start: 2021-03-24 — End: 2021-03-24
  Administered 2021-03-24: 60 mg via INTRAVENOUS

## 2021-03-24 MED ORDER — LIDOCAINE 2% (20 MG/ML) 5 ML SYRINGE
INTRAMUSCULAR | Status: AC
  Filled 2021-03-24: qty 5

## 2021-03-24 MED ORDER — FENTANYL CITRATE (PF) 250 MCG/5ML IJ SOLN
INTRAMUSCULAR | Status: AC
  Filled 2021-03-24: qty 5

## 2021-03-24 MED ORDER — ONDANSETRON HCL 4 MG/2ML IJ SOLN
INTRAMUSCULAR | Status: DC | PRN
Start: 2021-03-24 — End: 2021-03-24
  Administered 2021-03-24: 4 mg via INTRAVENOUS

## 2021-03-24 MED ORDER — ACETAMINOPHEN 160 MG/5ML PO SOLN: 1000.0000 mg | Freq: Once | ORAL | Status: DC | PRN

## 2021-03-24 MED ORDER — ACETAMINOPHEN 500 MG PO TABS: 1000.0000 mg | ORAL_TABLET | Freq: Once | ORAL | Status: DC | PRN

## 2021-03-24 MED ORDER — LIDOCAINE-EPINEPHRINE 2 %-1:100000 IJ SOLN
INTRAMUSCULAR | Status: AC
  Filled 2021-03-24: qty 2

## 2021-03-24 MED ORDER — OXYMETAZOLINE HCL 0.05 % NA SOLN
NASAL | Status: AC
  Filled 2021-03-24: qty 60

## 2021-03-24 MED ORDER — ROCURONIUM BROMIDE 10 MG/ML (PF) SYRINGE
PREFILLED_SYRINGE | INTRAVENOUS | Status: DC | PRN
  Administered 2021-03-24: 60 mg via INTRAVENOUS

## 2021-03-24 MED ORDER — FENTANYL CITRATE (PF) 100 MCG/2ML IJ SOLN
25.0000 ug | INTRAMUSCULAR | Status: DC | PRN
  Administered 2021-03-24: 25 ug via INTRAVENOUS

## 2021-03-24 MED ORDER — 0.9 % SODIUM CHLORIDE (POUR BTL) OPTIME
TOPICAL | Status: DC | PRN
  Administered 2021-03-24: 1000 mL

## 2021-03-24 MED ORDER — CEFAZOLIN SODIUM-DEXTROSE 2-4 GM/100ML-% IV SOLN
2.0000 g | INTRAVENOUS | Status: AC
  Administered 2021-03-24: 2 g via INTRAVENOUS
  Filled 2021-03-24: qty 100

## 2021-03-24 MED ORDER — PHENYLEPHRINE 40 MCG/ML (10ML) SYRINGE FOR IV PUSH (FOR BLOOD PRESSURE SUPPORT)
PREFILLED_SYRINGE | INTRAVENOUS | Status: AC
  Filled 2021-03-24: qty 10

## 2021-03-24 MED ORDER — LIDOCAINE-EPINEPHRINE 2 %-1:100000 IJ SOLN
INTRAMUSCULAR | Status: DC | PRN
  Administered 2021-03-24: 10 mL via INTRADERMAL

## 2021-03-24 MED ORDER — LACTATED RINGERS IV SOLN: INTRAVENOUS | Status: DC

## 2021-03-24 MED ORDER — CHLORHEXIDINE GLUCONATE 0.12 % MT SOLN
15.0000 mL | Freq: Once | OROMUCOSAL | Status: AC
  Administered 2021-03-24: 15 mL via OROMUCOSAL
  Filled 2021-03-24: qty 15

## 2021-03-24 SURGICAL SUPPLY — 32 items
BLADE SURG 15 STRL LF DISP TIS (BLADE) ×1 IMPLANT
BLADE SURG 15 STRL SS (BLADE) ×2
BUR CROSS CUT FISSURE 1.6 (BURR) ×2 IMPLANT
CANISTER SUCT 3000ML PPV (MISCELLANEOUS) ×2 IMPLANT
COVER SURGICAL LIGHT HANDLE (MISCELLANEOUS) ×2 IMPLANT
DRAPE U-SHAPE 76X120 STRL (DRAPES) ×2 IMPLANT
GAUZE PACKING FOLDED 2  STR (GAUZE/BANDAGES/DRESSINGS) ×2
GAUZE PACKING FOLDED 2 STR (GAUZE/BANDAGES/DRESSINGS) ×1 IMPLANT
GLOVE SURG ENC MOIS LTX SZ6.5 (GLOVE) IMPLANT
GLOVE SURG ENC MOIS LTX SZ7 (GLOVE) IMPLANT
GLOVE SURG ENC MOIS LTX SZ8 (GLOVE) ×2 IMPLANT
GLOVE SURG UNDER POLY LF SZ6.5 (GLOVE) IMPLANT
GLOVE SURG UNDER POLY LF SZ7 (GLOVE) IMPLANT
GOWN STRL REUS W/ TWL LRG LVL3 (GOWN DISPOSABLE) ×1 IMPLANT
GOWN STRL REUS W/ TWL XL LVL3 (GOWN DISPOSABLE) ×1 IMPLANT
GOWN STRL REUS W/TWL LRG LVL3 (GOWN DISPOSABLE) ×2
GOWN STRL REUS W/TWL XL LVL3 (GOWN DISPOSABLE) ×2
IV NS 1000ML (IV SOLUTION) ×2
IV NS 1000ML BAXH (IV SOLUTION) ×1 IMPLANT
KIT BASIN OR (CUSTOM PROCEDURE TRAY) ×2 IMPLANT
KIT TURNOVER KIT B (KITS) ×2 IMPLANT
NDL HYPO 25GX1X1/2 BEV (NEEDLE) ×2 IMPLANT
NEEDLE HYPO 25GX1X1/2 BEV (NEEDLE) ×4 IMPLANT
NS IRRIG 1000ML POUR BTL (IV SOLUTION) ×2 IMPLANT
PAD ARMBOARD 7.5X6 YLW CONV (MISCELLANEOUS) ×3 IMPLANT
SLEEVE IRRIGATION ELITE 7 (MISCELLANEOUS) ×2 IMPLANT
SUT CHROMIC 3 0 PS 2 (SUTURE) ×2 IMPLANT
SYR BULB IRRIG 60ML STRL (SYRINGE) ×2 IMPLANT
SYR CONTROL 10ML LL (SYRINGE) ×2 IMPLANT
TRAY ENT MC OR (CUSTOM PROCEDURE TRAY) ×2 IMPLANT
TUBING IRRIGATION (MISCELLANEOUS) ×2 IMPLANT
YANKAUER SUCT BULB TIP NO VENT (SUCTIONS) ×2 IMPLANT

## 2021-03-24 NOTE — Op Note (Signed)
03/24/2021  8:03 AM  PATIENT:  [name]  71 y.o. female  PRE-OPERATIVE DIAGNOSIS:  Irregular mandibular alveolar ridge contour  POST-OPERATIVE DIAGNOSIS:  SAME  PROCEDURE:  Procedure(s): Bilateral mandibular alveoloplasty   SURGEON:  Surgeon(s): Diona Browner, DMD  ANESTHESIA:   local and general  EBL:  minimal  DRAINS: none   SPECIMEN:  No Specimen  COUNTS:  YES  PLAN OF CARE: Discharge to home after PACU  PATIENT DISPOSITION:  PACU - hemodynamically stable.   PROCEDURE DETAILS: Dictation # [id number]  Gae Bon, DMD 03/24/2021 8:03 AM

## 2021-03-24 NOTE — Op Note (Signed)
[name], [name] MEDICAL RECORD NO: [id number] ACCOUNT NO: [id number] DATE OF BIRTH: March 16, 1950 FACILITY: [facility] LOCATION: [facility] PHYSICIAN: Gae Bon, DDS  Operative Report   DATE OF PROCEDURE: 03/24/2021  PREOPERATIVE DIAGNOSIS:  Irregular edentulous mandibular ridge alveolar contour.  POSTOPERATIVE DIAGNOSIS:  Irregular edentulous mandibular ridge alveolar contour.  PROCEDURE:  Bilateral mandibular alveoloplasty.  SURGEON:  Gae Bon, DDS  ANESTHESIA:  General, nasal intubation.  ATTENDING:  Dr. Ermalene Postin.  INDICATIONS:  The patient is a 70 year old female who underwent removal of all remaining teeth in November of 2022.  She reported to the office some weeks ago complaining of pain on the right and left mandible.  She was examined and found to have  multiple undercut areas and bony protrusions, but no frank bone exposure because of the irregular contour it was recommended an alveoplasty be performed in order to smooth the ridge so that the denture could be fabricated.  DESCRIPTION OF PROCEDURE:  The patient was taken to the operating room and placed on the table in the supine position.  General anesthesia was administered, nasal endotracheal tube was placed and secured.  The eyes were taped and the patient was draped  for surgery.  Timeout was performed.  The posterior pharynx was suctioned and a throat pack was placed.  2% lidocaine 1:100,000 epinephrine was infiltrated in the inferior alveolar block on the right and left sides and a buccal infiltration of the  anterior mandible.  A bite block was placed on the right side of the mouth.  A sweetheart retractor was used to retract the tongue.  A 15 blade was used to make an incision in the area of #18 on the alveolar crest, carried forward on the crest of the  edentulous ridge to the midline where a vertical releasing incision was made buccally.  Periosteum was reflected to expose the ridge.  There were indeed multiple  areas of bony protrusions and sharp areas.  The egg bur was then used under irrigation and a  Stryker handpiece to smooth the bone down then the bone file was used to further smooth this area.  The incision was irrigated and closed with 3-0 chromic.  The bite block and sweetheart retractor were repositioned to the other side of the mouth.  A 15  blade was used to make an incision beginning in the area of tooth #30 and carried forward towards the midline to the previous incision.  The tissue was reflected to expose the alveolar bone, which was irregular in contour. The sharp edges and then the  egg bur under irrigation was used to reduce the contour and smooth the overall contour of the bone.  The bone file was then used to further smooth this area and it was irrigated and closed with 3-0 chromic.  The oral cavity was then irrigated and  suctioned.  The throat pack was removed.  The patient was left under the care of anesthesia for extubation and transport to recovery room with plans for discharge home through day surgery.  ESTIMATED BLOOD LOSS:  Minimal.  COMPLICATIONS:  None.  SPECIMENS:  None.   PUS D: 03/24/2021 8:06:53 am T: 03/24/2021 8:52:00 am  JOB: [id number]/ [id number]

## 2021-03-24 NOTE — Anesthesia Procedure Notes (Signed)
Procedure Name: Intubation Date/Time: 03/24/2021 7:32 AM Performed by: Lorie Phenix, CRNA Pre-anesthesia Checklist: Patient identified, Emergency Drugs available, Suction available and Patient being monitored Patient Re-evaluated:Patient Re-evaluated prior to induction Oxygen Delivery Method: Circle system utilized Preoxygenation: Pre-oxygenation with 100% oxygen Induction Type: IV induction Ventilation: Mask ventilation without difficulty and Oral airway inserted - appropriate to patient size Laryngoscope Size: Mac and 3 Grade View: Grade I Nasal Tubes: Magill forceps- large, utilized, Nasal Rae, Nasal prep performed and Left Tube size: 7.0 mm Number of attempts: 1 Placement Confirmation: ETT inserted through vocal cords under direct vision, positive ETCO2 and breath sounds checked- equal and bilateral Tube secured with: Tape Dental Injury: Teeth and Oropharynx as per pre-operative assessment

## 2021-03-24 NOTE — Anesthesia Preprocedure Evaluation (Signed)
Anesthesia Evaluation  Patient identified by MRN, date of birth, ID band Patient awake    Reviewed: Allergy & Precautions, NPO status , Patient's Chart, lab work & pertinent test results  History of Anesthesia Complications Negative for: history of anesthetic complications  Airway Mallampati: I  TM Distance: >3 FB Neck ROM: Limited    Dental  (+) Edentulous Upper, Edentulous Lower, Dental Advisory Given   Pulmonary sleep apnea , COPD, neg recent URI,    breath sounds clear to auscultation       Cardiovascular hypertension, (-) angina(-) Past MI and (-) CHF  Rhythm:Regular     Neuro/Psych PSYCHIATRIC DISORDERS Anxiety Depression  Neuromuscular disease    GI/Hepatic Neg liver ROS, hiatal hernia, GERD  Medicated and Controlled,  Endo/Other  Hypothyroidism   Renal/GU negative Renal ROSLab Results      Component                Value               Date                      CREATININE               0.81                03/24/2021                Musculoskeletal   Abdominal   Peds  Hematology negative hematology ROS (+) Lab Results      Component                Value               Date                      WBC                      7.6                 01/17/2021                HGB                      13.3                01/17/2021                HCT                      39.0                01/17/2021                MCV                      87.8                01/17/2021                PLT                      222                 01/17/2021              Anesthesia Other Findings   Reproductive/Obstetrics  Anesthesia Physical Anesthesia Plan  ASA: 2  Anesthesia Plan: General   Post-op Pain Management: Minimal or no pain anticipated   Induction: Intravenous  PONV Risk Score and Plan: 3 and Dexamethasone and Ondansetron  Airway Management Planned: Nasal  ETT  Additional Equipment: None  Intra-op Plan:   Post-operative Plan: Extubation in OR  Informed Consent: I have reviewed the patients History and Physical, chart, labs and discussed the procedure including the risks, benefits and alternatives for the proposed anesthesia with the patient or authorized representative who has indicated his/her understanding and acceptance.     Dental advisory given  Plan Discussed with: CRNA and Anesthesiologist  Anesthesia Plan Comments:         Anesthesia Quick Evaluation

## 2021-03-24 NOTE — Transfer of Care (Signed)
Immediate Anesthesia Transfer of Care Note  Patient: [name]  Procedure(s) Performed: DENTAL RESTORATION/EXTRACTIONS  Patient Location: PACU  Anesthesia Type:General  Level of Consciousness: awake and alert   Airway & Oxygen Therapy: Patient Spontanous Breathing and Patient connected to face mask oxygen  Post-op Assessment: Report given to RN and Post -op Vital signs reviewed and stable  Post vital signs: Reviewed and stable  Last Vitals:  Vitals Value Taken Time    03/24/21 0812  Temp    Pulse 92 03/24/21 0814  Resp 24 03/24/21 0814  SpO2 100 % 03/24/21 0814  Vitals shown include unvalidated device data.  Last Pain:  Vitals:   03/24/21 0551  TempSrc: Oral         Complications: No notable events documented.

## 2021-03-24 NOTE — H&P (Signed)
H&P documentation  -History and Physical Reviewed  -Patient has been re-examined  -No change in the plan of care  [name]  

## 2021-03-26 NOTE — Anesthesia Postprocedure Evaluation (Signed)
Anesthesia Post Note  Patient: Kynley Metzger  Procedure(s) Performed: DENTAL RESTORATION/EXTRACTIONS     Patient location during evaluation: PACU Anesthesia Type: General Level of consciousness: awake and alert Pain management: pain level controlled Vital Signs Assessment: post-procedure vital signs reviewed and stable Respiratory status: spontaneous breathing, nonlabored ventilation, respiratory function stable and patient connected to nasal cannula oxygen Cardiovascular status: blood pressure returned to baseline and stable Postop Assessment: no apparent nausea or vomiting Anesthetic complications: no   No notable events documented.  Last Vitals:  Vitals:   03/24/21 0827 03/24/21 0843  BP: (!) 178/72 (!) 161/63  Pulse: 86 63  Resp: 12 13  Temp:  (!) 36.1 C  SpO2: 100% 99%    Last Pain:  Vitals:   03/24/21 0551  TempSrc: Oral                 Stephen Turnbaugh

## 2021-03-27 ENCOUNTER — Encounter (HOSPITAL_COMMUNITY): Payer: Self-pay | Admitting: Oral Surgery

## 2021-04-24 ENCOUNTER — Telehealth: Payer: Self-pay | Admitting: Internal Medicine

## 2021-04-24 NOTE — Telephone Encounter (Signed)
Is this patient okay to just do a pre-visit. She is not on any medications that will need to be held? ?

## 2021-04-24 NOTE — Telephone Encounter (Signed)
This patient called this morning wanting to reschedule her colonoscopy with Dr. [name].  Her OV with him was in November.  Since so much time has elapsed, does she need an OV, need to schedule a PV, or can she just go ahead and reschedule the procedure?  She still has her prep from where she was originally scheduled back in February.  Please let me know how to proceed.  Thank you. ?

## 2021-04-25 NOTE — Telephone Encounter (Signed)
Patient has been rescheduled for 06/12/21 at 1130 am, 1030 am arrival. She verbalizes understanding. ?

## 2021-04-25 NOTE — Telephone Encounter (Signed)
Colonoscopy can be scheduled for surveillance without office visit first ?

## 2021-05-29 ENCOUNTER — Other Ambulatory Visit: Payer: Self-pay | Admitting: Internal Medicine

## 2021-06-07 ENCOUNTER — Other Ambulatory Visit: Payer: Self-pay | Admitting: Internal Medicine

## 2021-06-12 ENCOUNTER — Encounter: Payer: Self-pay | Admitting: Internal Medicine

## 2021-06-12 ENCOUNTER — Ambulatory Visit (AMBULATORY_SURGERY_CENTER): Payer: Medicare Other | Admitting: Internal Medicine

## 2021-06-12 VITALS — BP 119/50 | HR 64 | Temp 96.8°F | Resp 10 | Ht 60.0 in | Wt 176.0 lb

## 2021-06-12 DIAGNOSIS — Z86010 Personal history of colonic polyps: Secondary | ICD-10-CM

## 2021-06-12 MED ORDER — SODIUM CHLORIDE 0.9 % IV SOLN: 500.0000 mL | Freq: Once | INTRAVENOUS | Status: DC
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
## 2021-06-12 NOTE — Patient Instructions (Addendum)
Resume previous medications.  Handouts on findings given to patient.   (Diverticulosis) ? ? ?YOU HAD AN ENDOSCOPIC PROCEDURE TODAY AT [facility]:   Refer to the procedure report that was given to you for any specific questions about what was found during the examination.  If the procedure report does not answer your questions, please call your gastroenterologist to clarify.  If you requested that your care partner not be given the details of your procedure findings, then the procedure report has been included in a sealed envelope for you to review at your convenience later. ? ?YOU SHOULD EXPECT: Some feelings of bloating in the abdomen. Passage of more gas than usual.  Walking can help get rid of the air that was put into your GI tract during the procedure and reduce the bloating. If you had a lower endoscopy (such as a colonoscopy or flexible sigmoidoscopy) you may notice spotting of blood in your stool or on the toilet paper. If you underwent a bowel prep for your procedure, you may not have a normal bowel movement for a few days. ? ?Please Note:  You might notice some irritation and congestion in your nose or some drainage.  This is from the oxygen used during your procedure.  There is no need for concern and it should clear up in a day or so. ? ?SYMPTOMS TO REPORT IMMEDIATELY: ? ?Following lower endoscopy (colonoscopy or flexible sigmoidoscopy): ? Excessive amounts of blood in the stool ? Significant tenderness or worsening of abdominal pains ? Swelling of the abdomen that is new, acute ? Fever of 100?F or higher ? ? ?For urgent or emergent issues, a gastroenterologist can be reached at any hour by calling [phone]. ?Do not use MyChart messaging for urgent concerns.  ? ? ?DIET:  We do recommend a small meal at first, but then you may proceed to your regular diet.  Drink plenty of fluids but you should avoid alcoholic beverages for 24 hours. ? ?ACTIVITY:  You should plan to take it easy  for the rest of today and you should NOT DRIVE or use heavy machinery until tomorrow (because of the sedation medicines used during the test).   ? ?FOLLOW UP: ?Our staff will call the number listed on your records 48-72 hours following your procedure to check on you and address any questions or concerns that you may have regarding the information given to you following your procedure. If we do not reach you, we will leave a message.  We will attempt to reach you two times.  During this call, we will ask if you have developed any symptoms of COVID 19. If you develop any symptoms (ie: fever, flu-like symptoms, shortness of breath, cough etc.) before then, please call [phone].  If you test positive for Covid 19 in the 2 weeks post procedure, please call and report this information to [place].   ? ?If any biopsies were taken you will be contacted by phone or by letter within the next 1-3 weeks.  Please call us at [phone] if you have not heard about the biopsies in 3 weeks.  ? ? ?SIGNATURES/CONFIDENTIALITY: ?You and/or your care partner have signed paperwork which will be entered into your electronic medical record.  These signatures attest to the fact that that the information above on your After Visit Summary has been reviewed and is understood.  Full responsibility of the confidentiality of this discharge information lies with you and/or your care-partner.  ?

## 2021-06-12 NOTE — Progress Notes (Signed)
Pt non-responsive, VVS, Report to RN  °

## 2021-06-12 NOTE — Op Note (Signed)
[facility] ?Patient Name: [name] ?Procedure Date: 06/12/2021 11:49 AM ?MRN: [id number] ?Endoscopist: Jerene Bears , MD ?Age: 71 ?Referring MD:  ?Date of Birth: 29-Nov-1950 ?Gender: Female ?Account #: [id number] ?Procedure:                Colonoscopy ?Indications:              High risk colon cancer surveillance: Personal  ?                          history of non-advanced adenomas and SSPs, Last  ?                          colonoscopy: February 2020 ?Medicines:                Monitored Anesthesia Care ?Procedure:                Pre-Anesthesia Assessment: ?                          - Prior to the procedure, a History and Physical  ?                          was performed, and patient medications and  ?                          allergies were reviewed. The patient's tolerance of  ?                          previous anesthesia was also reviewed. The risks  ?                          and benefits of the procedure and the sedation  ?                          options and risks were discussed with the patient.  ?                          All questions were answered, and informed consent  ?                          was obtained. Prior Anticoagulants: The patient has  ?                          taken no previous anticoagulant or antiplatelet  ?                          agents. ASA Grade Assessment: III - A patient with  ?                          severe systemic disease. After reviewing the risks  ?                          and benefits, the patient was deemed in  ?  satisfactory condition to undergo the procedure. ?                          After obtaining informed consent, the colonoscope  ?                          was passed under direct vision. Throughout the  ?                          procedure, the patient's blood pressure, pulse, and  ?                          oxygen saturations were monitored continuously. The  ?                          Olympus PCF-H190DL (#[id number])  Colonoscope was  ?                          introduced through the anus and advanced to the  ?                          cecum, identified by appendiceal orifice and  ?                          ileocecal valve. The colonoscopy was performed  ?                          without difficulty. The patient tolerated the  ?                          procedure well. The quality of the bowel  ?                          preparation was good. The ileocecal valve,  ?                          appendiceal orifice, and rectum were photographed. ?Scope In: 12:03:51 PM ?Scope Out: 12:20:45 PM ?Scope Withdrawal Time: 0 hours 13 minutes 35 seconds  ?Total Procedure Duration: 0 hours 16 minutes 54 seconds  ?Findings:                 The digital rectal exam was normal. ?                          Multiple small-mouthed diverticula were found in  ?                          the sigmoid colon. ?                          The exam was otherwise without abnormality on  ?                          direct and retroflexion views. ?Complications:            No immediate complications. ?Estimated Blood  Loss:     Estimated blood loss: none. ?Impression:               - Diverticulosis in the sigmoid colon. ?                          - The examination was otherwise normal on direct  ?                          and retroflexion views. ?                          - No specimens collected. ?Recommendation:           - Patient has a contact number available for  ?                          emergencies. The signs and symptoms of potential  ?                          delayed complications were discussed with the  ?                          patient. Return to normal activities tomorrow.  ?                          Written discharge instructions were provided to the  ?                          patient. ?                          - Resume previous diet. ?                          - Continue present medications. ?                          - Await pathology results. ?                           - Consideration of repeat colonoscopy in 5 years  ?                          for surveillance versus discontinuation of  ?                          surveillance based on age. ?Jerene Bears, MD ?06/12/2021 12:38:07 PM ?This report has been signed electronically. ?

## 2021-06-14 ENCOUNTER — Telehealth: Payer: Self-pay

## 2021-06-14 NOTE — Telephone Encounter (Signed)
?  Follow up Call- ? ? ?  06/12/2021  ? 11:03 AM  ?Call back number  ?Post procedure Call Back phone  # [phone]  ?Permission to leave phone message Yes  ?  ? ?Patient questions: ? ?Do you have a fever, pain , or abdominal swelling? No. ?Pain Score  0 * ? ?Have you tolerated food without any problems? Yes.   ? ?Have you been able to return to your normal activities? Yes.   ? ?Do you have any questions about your discharge instructions: ?Diet   No. ?Medications  No. ?Follow up visit  No. ? ?Do you have questions or concerns about your Care? No. ? ?Actions: ?* If pain score is 4 or above: ?No action needed, pain <4. ? ? ?

## 2021-07-07 ENCOUNTER — Other Ambulatory Visit: Payer: Self-pay | Admitting: Orthopedic Surgery

## 2021-07-07 DIAGNOSIS — G8929 Other chronic pain: Secondary | ICD-10-CM

## 2021-08-08 ENCOUNTER — Other Ambulatory Visit: Payer: Self-pay | Admitting: Orthopedic Surgery

## 2021-08-08 ENCOUNTER — Other Ambulatory Visit (HOSPITAL_COMMUNITY): Payer: Self-pay | Admitting: Orthopedic Surgery

## 2021-08-08 DIAGNOSIS — G8929 Other chronic pain: Secondary | ICD-10-CM

## 2021-08-15 ENCOUNTER — Other Ambulatory Visit: Payer: Self-pay | Admitting: Internal Medicine

## 2021-09-15 ENCOUNTER — Other Ambulatory Visit: Payer: Self-pay

## 2021-09-15 ENCOUNTER — Encounter (HOSPITAL_COMMUNITY): Payer: Self-pay | Admitting: *Deleted

## 2021-09-15 NOTE — Progress Notes (Signed)
[name] denies chest pain or shortness of breath.  Patient denies having any s/s of Covid in her household.  Patient denies any known exposure to Covid.  [name]'s PCP is Dr. [name].  [name] has sleep apnea, she does not wear a mask, she has severe claustrophobia.

## 2021-09-18 NOTE — Anesthesia Preprocedure Evaluation (Signed)
Anesthesia Evaluation  Patient identified by MRN, date of birth, ID band Patient awake    Reviewed: Allergy & Precautions, H&P , NPO status , Patient's Chart, lab work & pertinent test results  Airway Mallampati: II  TM Distance: >3 FB Neck ROM: Full    Dental no notable dental hx. (+) Edentulous Upper, Dental Advisory Given   Pulmonary asthma , sleep apnea and Continuous Positive Airway Pressure Ventilation , COPD,  COPD inhaler,    Pulmonary exam normal breath sounds clear to auscultation       Cardiovascular Exercise Tolerance: Good hypertension,  Rhythm:Regular Rate:Normal     Neuro/Psych  Headaches, Anxiety Depression    GI/Hepatic Neg liver ROS, hiatal hernia, GERD  Medicated,  Endo/Other  Hypothyroidism   Renal/GU negative Renal ROS  negative genitourinary   Musculoskeletal  (+) Arthritis , Osteoarthritis,  Fibromyalgia -  Abdominal   Peds  Hematology  (+) Blood dyscrasia, anemia ,   Anesthesia Other Findings   Reproductive/Obstetrics negative OB ROS                            Anesthesia Physical Anesthesia Plan  ASA: 3  Anesthesia Plan: General   Post-op Pain Management: Minimal or no pain anticipated   Induction: Intravenous  PONV Risk Score and Plan: 3 and Ondansetron, Dexamethasone and Treatment may vary due to age or medical condition  Airway Management Planned: Oral ETT and LMA  Additional Equipment:   Intra-op Plan:   Post-operative Plan: Extubation in OR  Informed Consent: I have reviewed the patients History and Physical, chart, labs and discussed the procedure including the risks, benefits and alternatives for the proposed anesthesia with the patient or authorized representative who has indicated his/her understanding and acceptance.     Dental advisory given  Plan Discussed with: CRNA  Anesthesia Plan Comments:        Anesthesia Quick  Evaluation

## 2021-09-19 ENCOUNTER — Ambulatory Visit (HOSPITAL_BASED_OUTPATIENT_CLINIC_OR_DEPARTMENT_OTHER): Payer: Medicare Other | Admitting: Anesthesiology

## 2021-09-19 ENCOUNTER — Ambulatory Visit (HOSPITAL_COMMUNITY): Payer: Medicare Other | Admitting: Anesthesiology

## 2021-09-19 ENCOUNTER — Ambulatory Visit (HOSPITAL_COMMUNITY)
Admission: RE | Admit: 2021-09-19 | Discharge: 2021-09-19 | Disposition: A | Discharge status: Home / Self Care | Payer: Medicare Other | Attending: Orthopedic Surgery | Admitting: Orthopedic Surgery

## 2021-09-19 ENCOUNTER — Other Ambulatory Visit: Payer: Self-pay

## 2021-09-19 ENCOUNTER — Encounter (HOSPITAL_COMMUNITY): Admission: RE | Disposition: A | Discharge status: Home / Self Care | Payer: Self-pay | Source: Home / Self Care

## 2021-09-19 ENCOUNTER — Encounter (HOSPITAL_COMMUNITY): Payer: Self-pay

## 2021-09-19 ENCOUNTER — Ambulatory Visit (HOSPITAL_COMMUNITY)
Admission: RE | Admit: 2021-09-19 | Discharge: 2021-09-19 | Disposition: A | Discharge status: Home / Self Care | Payer: Medicare Other | Source: Ambulatory Visit | Attending: Orthopedic Surgery | Admitting: Orthopedic Surgery

## 2021-09-19 DIAGNOSIS — K219 Gastro-esophageal reflux disease without esophagitis: Secondary | ICD-10-CM | POA: Insufficient documentation

## 2021-09-19 DIAGNOSIS — M19011 Primary osteoarthritis, right shoulder: Secondary | ICD-10-CM | POA: Diagnosis not present

## 2021-09-19 DIAGNOSIS — Z79899 Other long term (current) drug therapy: Secondary | ICD-10-CM | POA: Insufficient documentation

## 2021-09-19 DIAGNOSIS — F419 Anxiety disorder, unspecified: Secondary | ICD-10-CM | POA: Diagnosis not present

## 2021-09-19 DIAGNOSIS — M797 Fibromyalgia: Secondary | ICD-10-CM | POA: Insufficient documentation

## 2021-09-19 DIAGNOSIS — J449 Chronic obstructive pulmonary disease, unspecified: Secondary | ICD-10-CM | POA: Insufficient documentation

## 2021-09-19 DIAGNOSIS — M67813 Other specified disorders of tendon, right shoulder: Secondary | ICD-10-CM | POA: Diagnosis not present

## 2021-09-19 DIAGNOSIS — I10 Essential (primary) hypertension: Secondary | ICD-10-CM | POA: Diagnosis not present

## 2021-09-19 DIAGNOSIS — M25511 Pain in right shoulder: Secondary | ICD-10-CM | POA: Diagnosis not present

## 2021-09-19 DIAGNOSIS — F32A Depression, unspecified: Secondary | ICD-10-CM | POA: Diagnosis not present

## 2021-09-19 DIAGNOSIS — E039 Hypothyroidism, unspecified: Secondary | ICD-10-CM | POA: Insufficient documentation

## 2021-09-19 DIAGNOSIS — G8929 Other chronic pain: Secondary | ICD-10-CM

## 2021-09-19 DIAGNOSIS — G4730 Sleep apnea, unspecified: Secondary | ICD-10-CM | POA: Diagnosis not present

## 2021-09-19 HISTORY — PX: RADIOLOGY WITH ANESTHESIA: SHX6223

## 2021-09-19 LAB — COMPREHENSIVE METABOLIC PANEL
ALT: 15 U/L (ref 0–44)
AST: 18 U/L (ref 15–41)
Albumin: 3.8 g/dL (ref 3.5–5.0)
Alkaline Phosphatase: 94 U/L (ref 38–126)
Anion gap: 8 (ref 5–15)
BUN: 12 mg/dL (ref 8–23)
CO2: 28 mmol/L (ref 22–32)
Calcium: 9.4 mg/dL (ref 8.9–10.3)
Chloride: 104 mmol/L (ref 98–111)
Creatinine, Ser: 0.84 mg/dL (ref 0.44–1.00)
GFR, Estimated: >60 mL/min (ref >60)
Glucose, Bld: 97 mg/dL (ref 70–99)
Potassium: 3.5 mmol/L (ref 3.5–5.1)
Sodium: 140 mmol/L (ref 135–145)
Total Bilirubin: 0.6 mg/dL (ref 0.3–1.2)
Total Protein: 6.9 g/dL (ref 6.5–8.1)

## 2021-09-19 LAB — SURGICAL PCR SCREEN
MRSA, PCR: POSITIVE — AB
Staphylococcus aureus: POSITIVE — AB

## 2021-09-19 SURGERY — MRI WITH ANESTHESIA
Anesthesia: General | Laterality: Right

## 2021-09-19 MED ORDER — FENTANYL CITRATE (PF) 250 MCG/5ML IJ SOLN
INTRAMUSCULAR | Status: DC | PRN
  Administered 2021-09-19: 100 ug via INTRAVENOUS

## 2021-09-19 MED ORDER — SUCCINYLCHOLINE CHLORIDE 200 MG/10ML IV SOSY
PREFILLED_SYRINGE | INTRAVENOUS | Status: DC | PRN
  Administered 2021-09-19: 120 mg via INTRAVENOUS

## 2021-09-19 MED ORDER — LACTATED RINGERS IV SOLN
INTRAVENOUS | Status: DC
Start: 2021-09-19 — End: 2021-09-19

## 2021-09-19 MED ORDER — PROPOFOL 10 MG/ML IV BOLUS
INTRAVENOUS | Status: DC | PRN
  Administered 2021-09-19: 100 mg via INTRAVENOUS

## 2021-09-19 MED ORDER — LIDOCAINE 2% (20 MG/ML) 5 ML SYRINGE
INTRAMUSCULAR | Status: DC | PRN
  Administered 2021-09-19: 60 mg via INTRAVENOUS

## 2021-09-19 MED ORDER — PHENYLEPHRINE 80 MCG/ML (10ML) SYRINGE FOR IV PUSH (FOR BLOOD PRESSURE SUPPORT)
PREFILLED_SYRINGE | INTRAVENOUS | Status: DC | PRN
  Administered 2021-09-19: 160 ug via INTRAVENOUS

## 2021-09-19 MED ORDER — ONDANSETRON HCL 4 MG/2ML IJ SOLN
INTRAMUSCULAR | Status: DC | PRN
  Administered 2021-09-19: 4 mg via INTRAVENOUS

## 2021-09-19 MED ORDER — ORAL CARE MOUTH RINSE: 15.0000 mL | Freq: Once | OROMUCOSAL | Status: AC

## 2021-09-19 MED ORDER — DEXAMETHASONE SODIUM PHOSPHATE 10 MG/ML IJ SOLN
INTRAMUSCULAR | Status: DC | PRN
  Administered 2021-09-19: 5 mg via INTRAVENOUS

## 2021-09-19 MED ORDER — PHENYLEPHRINE HCL-NACL 20-0.9 MG/250ML-% IV SOLN
INTRAVENOUS | Status: DC | PRN
  Administered 2021-09-19: 40 ug/min via INTRAVENOUS

## 2021-09-19 MED ORDER — CHLORHEXIDINE GLUCONATE 0.12 % MT SOLN
15.0000 mL | Freq: Once | OROMUCOSAL | Status: AC
  Administered 2021-09-19: 15 mL via OROMUCOSAL
  Filled 2021-09-19: qty 15

## 2021-09-19 NOTE — Anesthesia Procedure Notes (Signed)
Procedure Name: Intubation Date/Time: 09/19/2021 8:17 AM  Performed by: Imagene Riches, CRNAPre-anesthesia Checklist: Patient identified, Emergency Drugs available, Suction available and Patient being monitored Patient Re-evaluated:Patient Re-evaluated prior to induction Oxygen Delivery Method: Circle System Utilized Preoxygenation: Pre-oxygenation with 100% oxygen Induction Type: IV induction Ventilation: Mask ventilation without difficulty and Oral airway inserted - appropriate to patient size Laryngoscope Size: Glidescope and 3 Grade View: Grade I Tube type: Oral Tube size: 7.0 mm Number of attempts: 1 Airway Equipment and Method: Stylet and Oral airway Placement Confirmation: ETT inserted through vocal cords under direct vision, positive ETCO2 and breath sounds checked- equal and bilateral Secured at: 21 cm Tube secured with: Tape Dental Injury: Teeth and Oropharynx as per pre-operative assessment

## 2021-09-19 NOTE — Anesthesia Postprocedure Evaluation (Signed)
Anesthesia Post Note  Patient: [name]  Procedure(s) Performed: MRI RIGHT SHOULDER WITHOUT CONTRAST WITH ANESTHESIA (Right)     Patient location during evaluation: PACU Anesthesia Type: General Level of consciousness: awake and alert Pain management: pain level controlled Vital Signs Assessment: post-procedure vital signs reviewed and stable Respiratory status: spontaneous breathing, nonlabored ventilation and respiratory function stable Cardiovascular status: blood pressure returned to baseline and stable Postop Assessment: no apparent nausea or vomiting Anesthetic complications: no   No notable events documented.  Last Vitals:  Vitals:   09/19/21 0915 09/19/21 0930  BP: 136/71 128/73  Pulse: 64 65  Resp: 13 (!) 6  Temp:    SpO2: 97% 93%    Last Pain:  Vitals:   09/19/21 0930  PainSc: 0-No pain                 Talibah Colasurdo,W. EDMOND

## 2021-09-19 NOTE — Transfer of Care (Signed)
Immediate Anesthesia Transfer of Care Note  Patient: [name]  Procedure(s) Performed: MRI RIGHT SHOULDER WITHOUT CONTRAST WITH ANESTHESIA (Right)  Patient Location: PACU  Anesthesia Type:General  Level of Consciousness: drowsy  Airway & Oxygen Therapy: Patient Spontanous Breathing and Patient connected to nasal cannula oxygen  Post-op Assessment: Report given to RN and Post -op Vital signs reviewed and stable  Post vital signs: Reviewed and stable  Last Vitals:  Vitals Value Taken Time  BP    Temp    Pulse 73 09/19/21 0904  Resp    SpO2 89 % 09/19/21 0904  Vitals shown include unvalidated device data.  Last Pain:  Vitals:   09/19/21 0653  PainSc: 9          Complications: No notable events documented.

## 2021-09-20 ENCOUNTER — Encounter (HOSPITAL_COMMUNITY): Payer: Self-pay | Admitting: Radiology

## 2021-10-12 ENCOUNTER — Other Ambulatory Visit: Payer: Self-pay | Admitting: Internal Medicine

## 2021-10-29 ENCOUNTER — Other Ambulatory Visit: Payer: Self-pay | Admitting: Internal Medicine

## 2022-01-06 ENCOUNTER — Observation Stay: Payer: Medicare Other

## 2022-01-06 ENCOUNTER — Observation Stay
Admission: EM | Admit: 2022-01-06 | Discharge: 2022-01-08 | Disposition: A | Discharge status: Home / Self Care | Payer: Medicare Other | Attending: Internal Medicine | Admitting: Internal Medicine

## 2022-01-06 ENCOUNTER — Other Ambulatory Visit: Payer: Self-pay

## 2022-01-06 ENCOUNTER — Observation Stay
Admit: 2022-01-06 | Discharge: 2022-01-06 | Disposition: A | Discharge status: Home / Self Care | Payer: Medicare Other | Attending: Internal Medicine | Admitting: Internal Medicine

## 2022-01-06 ENCOUNTER — Emergency Department: Payer: Medicare Other

## 2022-01-06 DIAGNOSIS — Z9884 Bariatric surgery status: Secondary | ICD-10-CM

## 2022-01-06 DIAGNOSIS — Z79899 Other long term (current) drug therapy: Secondary | ICD-10-CM | POA: Insufficient documentation

## 2022-01-06 DIAGNOSIS — J449 Chronic obstructive pulmonary disease, unspecified: Secondary | ICD-10-CM | POA: Diagnosis not present

## 2022-01-06 DIAGNOSIS — R2689 Other abnormalities of gait and mobility: Secondary | ICD-10-CM | POA: Insufficient documentation

## 2022-01-06 DIAGNOSIS — R0789 Other chest pain: Secondary | ICD-10-CM

## 2022-01-06 DIAGNOSIS — Z1152 Encounter for screening for COVID-19: Secondary | ICD-10-CM | POA: Insufficient documentation

## 2022-01-06 DIAGNOSIS — G2581 Restless legs syndrome: Secondary | ICD-10-CM | POA: Diagnosis not present

## 2022-01-06 DIAGNOSIS — G4733 Obstructive sleep apnea (adult) (pediatric): Secondary | ICD-10-CM | POA: Diagnosis present

## 2022-01-06 DIAGNOSIS — E039 Hypothyroidism, unspecified: Secondary | ICD-10-CM | POA: Diagnosis present

## 2022-01-06 DIAGNOSIS — E785 Hyperlipidemia, unspecified: Secondary | ICD-10-CM | POA: Diagnosis present

## 2022-01-06 DIAGNOSIS — Z79890 Hormone replacement therapy (postmenopausal): Secondary | ICD-10-CM | POA: Diagnosis not present

## 2022-01-06 DIAGNOSIS — I10 Essential (primary) hypertension: Secondary | ICD-10-CM | POA: Diagnosis not present

## 2022-01-06 DIAGNOSIS — R0602 Shortness of breath: Secondary | ICD-10-CM | POA: Diagnosis not present

## 2022-01-06 DIAGNOSIS — K219 Gastro-esophageal reflux disease without esophagitis: Secondary | ICD-10-CM | POA: Diagnosis not present

## 2022-01-06 DIAGNOSIS — F419 Anxiety disorder, unspecified: Secondary | ICD-10-CM | POA: Diagnosis present

## 2022-01-06 DIAGNOSIS — R079 Chest pain, unspecified: Secondary | ICD-10-CM | POA: Diagnosis present

## 2022-01-06 DIAGNOSIS — I272 Pulmonary hypertension, unspecified: Secondary | ICD-10-CM | POA: Diagnosis not present

## 2022-01-06 DIAGNOSIS — F32A Depression, unspecified: Secondary | ICD-10-CM | POA: Diagnosis not present

## 2022-01-06 DIAGNOSIS — R519 Headache, unspecified: Principal | ICD-10-CM | POA: Insufficient documentation

## 2022-01-06 DIAGNOSIS — E876 Hypokalemia: Secondary | ICD-10-CM | POA: Diagnosis present

## 2022-01-06 DIAGNOSIS — Z8781 Personal history of (healed) traumatic fracture: Secondary | ICD-10-CM

## 2022-01-06 DIAGNOSIS — I491 Atrial premature depolarization: Secondary | ICD-10-CM

## 2022-01-06 LAB — ECHOCARDIOGRAM COMPLETE
AR max vel: 1.67 cm2
AV Peak grad: 11.6 mmHg
Ao pk vel: 1.71 m/s
Area-P 1/2: 2.39 cm2
Calc EF: 64.1 %
Height: 60 in
S' Lateral: 3 cm
Single Plane A2C EF: 60.7 %
Single Plane A4C EF: 64.5 %
Weight: 2640 oz

## 2022-01-06 LAB — HEPATIC FUNCTION PANEL
ALT: 19 U/L (ref 0–44)
AST: 20 U/L (ref 15–41)
Albumin: 3.7 g/dL (ref 3.5–5.0)
Alkaline Phosphatase: 74 U/L (ref 38–126)
Bilirubin, Direct: 0.1 mg/dL (ref 0.0–0.2)
Indirect Bilirubin: 1 mg/dL — ABNORMAL HIGH (ref 0.3–0.9)
Total Bilirubin: 1.1 mg/dL (ref 0.3–1.2)
Total Protein: 6.8 g/dL (ref 6.5–8.1)

## 2022-01-06 LAB — MAGNESIUM: Magnesium: 2.1 mg/dL (ref 1.7–2.4)

## 2022-01-06 LAB — URINALYSIS, ROUTINE W REFLEX MICROSCOPIC
Bilirubin Urine: NEGATIVE
Glucose, UA: NEGATIVE mg/dL
Hgb urine dipstick: NEGATIVE
Ketones, ur: NEGATIVE mg/dL
Leukocytes,Ua: NEGATIVE
Nitrite: NEGATIVE
Protein, ur: NEGATIVE mg/dL
Specific Gravity, Urine: 1.008 (ref 1.005–1.030)
pH: 6 (ref 5.0–8.0)

## 2022-01-06 LAB — CBC
HCT: 43.5 % (ref 36.0–46.0)
Hemoglobin: 15.2 g/dL — ABNORMAL HIGH (ref 12.0–15.0)
MCH: 30.7 pg (ref 26.0–34.0)
MCHC: 34.9 g/dL (ref 30.0–36.0)
MCV: 87.9 fL (ref 80.0–100.0)
Platelets: 279 10*3/uL (ref 150–400)
RBC: 4.95 MIL/uL (ref 3.87–5.11)
RDW: 13.3 % (ref 11.5–15.5)
WBC: 17.2 10*3/uL — ABNORMAL HIGH (ref 4.0–10.5)
nRBC: 0 % (ref 0.0–0.2)

## 2022-01-06 LAB — BASIC METABOLIC PANEL
Anion gap: 12 (ref 5–15)
BUN: 21 mg/dL (ref 8–23)
CO2: 27 mmol/L (ref 22–32)
Calcium: 9 mg/dL (ref 8.9–10.3)
Chloride: 104 mmol/L (ref 98–111)
Creatinine, Ser: 0.78 mg/dL (ref 0.44–1.00)
GFR, Estimated: >60 mL/min (ref >60)
Glucose, Bld: 99 mg/dL (ref 70–99)
Potassium: 2.9 mmol/L — ABNORMAL LOW (ref 3.5–5.1)
Sodium: 143 mmol/L (ref 135–145)

## 2022-01-06 LAB — TROPONIN I (HIGH SENSITIVITY)
Troponin I (High Sensitivity): 8 ng/L (ref <18)
Troponin I (High Sensitivity): 8 ng/L (ref <18)

## 2022-01-06 LAB — RESP PANEL BY RT-PCR (FLU A&B, COVID) ARPGX2
Influenza A by PCR: NEGATIVE
Influenza B by PCR: NEGATIVE
SARS Coronavirus 2 by RT PCR: NEGATIVE

## 2022-01-06 LAB — BRAIN NATRIURETIC PEPTIDE: B Natriuretic Peptide: 36.8 pg/mL (ref 0.0–100.0)

## 2022-01-06 MED ORDER — PERFLUTREN LIPID MICROSPHERE
1.0000 mL | INTRAVENOUS | Status: AC | PRN
  Administered 2022-01-06: 3 mL via INTRAVENOUS

## 2022-01-06 MED ORDER — ONDANSETRON HCL 4 MG/2ML IJ SOLN
4.0000 mg | Freq: Four times a day (QID) | INTRAMUSCULAR | Status: DC | PRN
  Administered 2022-01-06: 4 mg via INTRAVENOUS
  Filled 2022-01-06: qty 2

## 2022-01-06 MED ORDER — LORAZEPAM 2 MG/ML IJ SOLN
0.5000 mg | Freq: Once | INTRAMUSCULAR | Status: AC
  Administered 2022-01-06: 0.5 mg via INTRAVENOUS
  Filled 2022-01-06: qty 1

## 2022-01-06 MED ORDER — MORPHINE SULFATE (PF) 2 MG/ML IV SOLN
1.0000 mg | INTRAVENOUS | Status: AC | PRN
  Administered 2022-01-06 – 2022-01-07 (×3): 1 mg via INTRAVENOUS
  Filled 2022-01-06 (×3): qty 1

## 2022-01-06 MED ORDER — ACETAMINOPHEN 500 MG PO TABS
1000.0000 mg | ORAL_TABLET | Freq: Once | ORAL | Status: AC
  Administered 2022-01-06: 1000 mg via ORAL
  Filled 2022-01-06: qty 2

## 2022-01-06 MED ORDER — HEPARIN SODIUM (PORCINE) 5000 UNIT/ML IJ SOLN
5000.0000 [IU] | Freq: Three times a day (TID) | INTRAMUSCULAR | Status: DC
Start: 2022-01-06 — End: 2022-01-08
  Administered 2022-01-06 – 2022-01-08 (×6): 5000 [IU] via SUBCUTANEOUS
  Filled 2022-01-06 (×6): qty 1

## 2022-01-06 MED ORDER — POTASSIUM CHLORIDE 10 MEQ/100ML IV SOLN
10.0000 meq | Freq: Once | INTRAVENOUS | Status: AC
  Administered 2022-01-06: 10 meq via INTRAVENOUS
  Filled 2022-01-06: qty 100

## 2022-01-06 MED ORDER — ACETAMINOPHEN 325 MG PO TABS: 650.0000 mg | ORAL_TABLET | ORAL | Status: DC | PRN

## 2022-01-06 MED ORDER — LEVOTHYROXINE SODIUM 88 MCG PO TABS
88.0000 ug | ORAL_TABLET | Freq: Every day | ORAL | Status: DC
  Administered 2022-01-07 – 2022-01-08 (×2): 88 ug via ORAL
  Filled 2022-01-06 (×2): qty 1

## 2022-01-06 MED ORDER — ALBUTEROL SULFATE (2.5 MG/3ML) 0.083% IN NEBU: 2.5000 mg | INHALATION_SOLUTION | Freq: Four times a day (QID) | RESPIRATORY_TRACT | Status: DC | PRN

## 2022-01-06 MED ORDER — METOPROLOL TARTRATE 25 MG PO TABS
12.5000 mg | ORAL_TABLET | Freq: Once | ORAL | Status: AC
  Administered 2022-01-06: 12.5 mg via ORAL
  Filled 2022-01-06: qty 1

## 2022-01-06 MED ORDER — ALUM & MAG HYDROXIDE-SIMETH 200-200-20 MG/5ML PO SUSP
30.0000 mL | Freq: Once | ORAL | Status: AC
  Administered 2022-01-06: 30 mL via ORAL
  Filled 2022-01-06: qty 30

## 2022-01-06 MED ORDER — METOPROLOL TARTRATE 5 MG/5ML IV SOLN: 2.5000 mg | INTRAVENOUS | Status: DC | PRN

## 2022-01-06 MED ORDER — IOHEXOL 350 MG/ML SOLN
75.0000 mL | Freq: Once | INTRAVENOUS | Status: AC | PRN
  Administered 2022-01-06: 75 mL via INTRAVENOUS

## 2022-01-06 MED ORDER — IOHEXOL 300 MG/ML  SOLN: 100.0000 mL | Freq: Once | INTRAMUSCULAR | Status: DC | PRN

## 2022-01-06 MED ORDER — NITROGLYCERIN 0.4 MG SL SUBL: 0.4000 mg | SUBLINGUAL_TABLET | SUBLINGUAL | Status: DC | PRN

## 2022-01-06 MED ORDER — POTASSIUM CHLORIDE 20 MEQ PO PACK
40.0000 meq | PACK | Freq: Once | ORAL | Status: AC
  Administered 2022-01-06: 40 meq via ORAL
  Filled 2022-01-06: qty 2

## 2022-01-06 MED ORDER — PANTOPRAZOLE SODIUM 40 MG PO TBEC
40.0000 mg | DELAYED_RELEASE_TABLET | Freq: Every day | ORAL | Status: DC
  Administered 2022-01-06 – 2022-01-07 (×2): 40 mg via ORAL
  Filled 2022-01-06 (×2): qty 1

## 2022-01-06 NOTE — Assessment & Plan Note (Signed)
Increase Protonix to twice daily dosing.

## 2022-01-06 NOTE — Assessment & Plan Note (Addendum)
CT scan of the chest negative for pulmonary embolism.  Cardiac enzymes are negative which goes along with not having a heart attack.  Patient does have some pain to palpation.  We will give a quick prednisone taper just in case costochondritis we will try diclofenac gel.  We will also empirically place on twice daily PPI.  Patient thinks her symptoms may be GI in nature.  Advised twice a day PPI as outpatient.  She will follow-up with her gastroenterologist as outpatient for

## 2022-01-06 NOTE — ED Triage Notes (Signed)
Pt presents via EMS c/o chest pain x3 days ago. Reports had too much going on and had to wait to be seen. Reports has had a fib on mobile home EKG but was unable to follow-up with ED due to other obligations. Took 3 ASA at home.Given nitro x1 by EMS.

## 2022-01-06 NOTE — Assessment & Plan Note (Signed)
-   Per med reconciliation patient is not taking her duloxetine

## 2022-01-06 NOTE — ED Notes (Signed)
Pt o2 100% on room air while ambulating.

## 2022-01-06 NOTE — Assessment & Plan Note (Signed)
-   Patient is currently not taking her duloxetine

## 2022-01-06 NOTE — Hospital Course (Signed)
Ms. [name] is a 71 year old female with history of hypothyroid, GERD, who presents emergency department for chief concerns of chest pain and shortness of breath.  Initial vitals in the emergency department showed temperature of 98, respiration rate of 16, heart rate of 88, blood pressure 137/93, SPO2 of 98% on room air.  Serum sodium is 143, potassium 2.9, chloride 104, bicarb 27, BUN of 21, serum creatinine 0.78, GFR greater than 60, nonfasting blood glucose 99, WBC 17.2, hemoglobin 15.2, platelets of 279.  BNP was 36.8.  High sensitive troponin was 8 and on repeat was 8.  Magnesium level was within normal limits at 2.1.  UA was ordered and pending in the ED.  ED treatment: Tylenol 1 g p.o., Maalox, potassium chloride 40 mill equivalents p.o. one-time dose, potassium chloride 10 mill equivalent one-time dose IV.  On 11/19, the patient was still having a lot of chest pain and headache.  Patient was given a few rounds of headache medications and Solu-Medrol for reproducible chest pain.  Her Protonix was increased to twice daily.  On 11/20, the patient feels that her symptoms may be GI in nature.  She is having symptoms after she eats.  Since she ate breakfast I was unable to order any testing for today.  The patient wanted to go home.  She states that she will follow-up with her gastroenterologist.  I advised her to eat 8 small meals a day.  Recommended Protonix twice a day dosing.  She states that she has an extra prescription of this at home.  She can also use Protonix once a day and over-the-counter Nexium in the evening.  The patient was still having headache and wanted something for preventative nature and was okay starting Topamax.
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
## 2022-01-06 NOTE — Assessment & Plan Note (Signed)
Potassium 2.6 on presentation and increased up to 3.6.  We will give a dose of oral potassium today.

## 2022-01-06 NOTE — Progress Notes (Signed)
  Echocardiogram 2D Echocardiogram has been performed. Definity IV ultrasound imaging agent used on this study.  [name] 01/06/2022, 3:20 PM

## 2022-01-06 NOTE — Assessment & Plan Note (Addendum)
Continue levothyroxine 

## 2022-01-06 NOTE — ED Provider Notes (Signed)
[facility] Provider Note    Event Date/Time   First MD Initiated Contact with Patient 01/06/22 1039     (approximate)   History   Chest Pain   HPI  [name] is a 71 y.o. female here with ongoing chest pain.  The patient has a history of hypertension, diabetes, hyperlipidemia, chronic migraines, and is here with severe chest pain, shortness of breath, palpitations.  The patient states that for the last several days, she has had progressively worsening palpitations, and her heart monitor has been reading in the 140s to 160s and telling her that she was in atrial fibrillation.  No history of A-fib.  She states that she has had some intermittent but progressively worsening dull, aching, substernal chest pressure with this which is ongoing currently.  She feels irregular heartbeat.  Denies any fevers or chills.  She does note significant stressors at home.     Physical Exam   Triage Vital Signs: ED Triage Vitals  Enc Vitals Group     BP 01/06/22 1005 (!) 137/93     Pulse Rate 01/06/22 1005 88     Resp 01/06/22 1005 16     Temp 01/06/22 1005 98 F (36.7 C)     Temp Source 01/06/22 1005 Oral     SpO2 01/06/22 1005 98 %     Weight 01/06/22 1001 165 lb (74.8 kg)     Height 01/06/22 1001 5' (1.524 m)     Head Circumference --      Peak Flow --      Pain Score 01/06/22 1001 8     Pain Loc --      Pain Edu? --      Excl. in [place]? --     Most recent vital signs: Vitals:   01/06/22 2013 01/06/22 2345  BP: (!) 143/74 104/63  Pulse: (!) 54 62  Resp: 18 19  Temp: 98.1 F (36.7 C) 98.4 F (36.9 C)  SpO2: 97% 97%     General: Awake, no distress.  CV:  Good peripheral perfusion.  No murmurs.  Frequent PACs/extra beats noted. Resp:  Normal effort.  Slight tachypnea. Abd:  No distention.  Other:  No leg swelling.   ED Results / Procedures / Treatments   Labs (all labs ordered are listed, but only abnormal results are displayed) Labs Reviewed   BASIC METABOLIC PANEL - Abnormal; Notable for the following components:      Result Value   Potassium 2.9 (*)    All other components within normal limits  CBC - Abnormal; Notable for the following components:   WBC 17.2 (*)    Hemoglobin 15.2 (*)    All other components within normal limits  HEPATIC FUNCTION PANEL - Abnormal; Notable for the following components:   Indirect Bilirubin 1.0 (*)    All other components within normal limits  URINALYSIS, ROUTINE W REFLEX MICROSCOPIC - Abnormal; Notable for the following components:   Color, Urine YELLOW (*)    APPearance CLEAR (*)    All other components within normal limits  RESP PANEL BY RT-PCR (FLU A&B, COVID) ARPGX2  MAGNESIUM  BRAIN NATRIURETIC PEPTIDE  TROPONIN I (HIGH SENSITIVITY)  TROPONIN I (HIGH SENSITIVITY)     EKG Sinus rhythm with PACs, ventricular rate 97.  PR 168, QRS 80, QTc 480.  No acute ST elevations or depressions.  No EKG evidence of acute ischemia or infarct.   RADIOLOGY Chest x-ray: No active disease   I also  independently reviewed and agree with radiologist interpretations.   PROCEDURES:  Critical Care performed: No     MEDICATIONS ORDERED IN ED: Medications  levothyroxine (SYNTHROID) tablet 88 mcg (has no administration in time range)  acetaminophen (TYLENOL) tablet 650 mg (has no administration in time range)  ondansetron (ZOFRAN) injection 4 mg (4 mg Intravenous Given 01/06/22 1516)  heparin injection 5,000 Units (5,000 Units Subcutaneous Given 01/06/22 2130)  metoprolol tartrate (LOPRESSOR) injection 2.5 mg (has no administration in time range)  nitroGLYCERIN (NITROSTAT) SL tablet 0.4 mg (has no administration in time range)  morphine (PF) 2 MG/ML injection 1 mg (1 mg Intravenous Given 01/06/22 2130)  pantoprazole (PROTONIX) EC tablet 40 mg (40 mg Oral Given 01/06/22 1516)  albuterol (PROVENTIL) (2.5 MG/3ML) 0.083% nebulizer solution 2.5 mg (has no administration in time range)  iohexol  (OMNIPAQUE) 300 MG/ML solution 100 mL (has no administration in time range)  potassium chloride (KLOR-CON) packet 40 mEq (40 mEq Oral Given 01/06/22 1111)  acetaminophen (TYLENOL) tablet 1,000 mg (1,000 mg Oral Given 01/06/22 1326)  alum & mag hydroxide-simeth (MAALOX/MYLANTA) 200-200-20 MG/5ML suspension 30 mL (30 mLs Oral Given 01/06/22 1326)  potassium chloride 10 mEq in 100 mL IVPB (0 mEq Intravenous Stopped 01/06/22 1532)  metoprolol tartrate (LOPRESSOR) tablet 12.5 mg (12.5 mg Oral Given 01/06/22 1403)  LORazepam (ATIVAN) injection 0.5 mg (0.5 mg Intravenous Given 01/06/22 1752)  iohexol (OMNIPAQUE) 350 MG/ML injection 75 mL (75 mLs Intravenous Contrast Given 01/06/22 1805)     IMPRESSION / MDM / ASSESSMENT AND PLAN / ED COURSE  I reviewed the triage vital signs and the nursing notes.                               This patient presents to the ED for concern of chest pain and palpitations, this involves an extensive number of treatment options, and is a complaint that carries with it a high risk of complications and morbidity.  The differential diagnosis includes atypical chest pain/angina, atrial fibrillation, other arrhythmia, electrolyte abnormality, GERD/gastritis, PE   Co morbidities that complicate the patient evaluation  Hypertension, anxiety, COPD   Additional history obtained:  Additional history obtained from prior GI notes, PCP notes    Lab Tests:  I Ordered, and personally interpreted labs.  The pertinent results include:   UA negative, BMP with mild hypokalemia, CBC with leukocytosis of 17,000, troponin negative, BNP normal   Imaging Studies ordered:  I ordered imaging studies including chest x-ray I independently visualized and interpreted imaging which showed: Chest x-ray shows no acute disease I agree with the radiologist interpretation   Cardiac Monitoring: / EKG:  The patient was maintained on a cardiac monitor.  I personally viewed and  interpreted the cardiac monitored which showed an underlying rhythm of: Sinus rhythm with frequent PACs, query possible underlying atrial fibrillation   Problem List / ED Course / Critical interventions / Medication management  Chest pain DDx remains ACS vs arrhythmia. Frequent pACs in ED with symptoms related to this. No signs of ischemia on EKG and trop negative. Pt does have murmur which she states is new, no valvular abnormality on last echo per my review WIll admit for observation  Social Determinants of Health:  Caregiver for multiple other sick individuals High stress at home   Test / Admission - Considered:  Admit to hospitalist   FINAL CLINICAL IMPRESSION(S) / ED DIAGNOSES   Final diagnoses:  Atypical chest pain  Premature atrial beat     Rx / DC Orders   ED Discharge Orders     None        Note:  This document was prepared using Dragon voice recognition software and may include unintentional dictation errors.   Duffy Bruce, MD 01/07/22 [phone]

## 2022-01-07 DIAGNOSIS — K219 Gastro-esophageal reflux disease without esophagitis: Secondary | ICD-10-CM | POA: Diagnosis not present

## 2022-01-07 DIAGNOSIS — R519 Headache, unspecified: Secondary | ICD-10-CM | POA: Diagnosis not present

## 2022-01-07 DIAGNOSIS — E876 Hypokalemia: Secondary | ICD-10-CM

## 2022-01-07 DIAGNOSIS — E039 Hypothyroidism, unspecified: Secondary | ICD-10-CM

## 2022-01-07 DIAGNOSIS — J449 Chronic obstructive pulmonary disease, unspecified: Secondary | ICD-10-CM

## 2022-01-07 DIAGNOSIS — R079 Chest pain, unspecified: Secondary | ICD-10-CM | POA: Diagnosis not present

## 2022-01-07 LAB — BASIC METABOLIC PANEL
Anion gap: 7 (ref 5–15)
BUN: 17 mg/dL (ref 8–23)
CO2: 30 mmol/L (ref 22–32)
Calcium: 8.7 mg/dL — ABNORMAL LOW (ref 8.9–10.3)
Chloride: 102 mmol/L (ref 98–111)
Creatinine, Ser: 0.74 mg/dL (ref 0.44–1.00)
GFR, Estimated: >60 mL/min (ref >60)
Glucose, Bld: 101 mg/dL — ABNORMAL HIGH (ref 70–99)
Potassium: 3.6 mmol/L (ref 3.5–5.1)
Sodium: 139 mmol/L (ref 135–145)

## 2022-01-07 LAB — SEDIMENTATION RATE: Sed Rate: 15 mm/hr (ref 0–30)

## 2022-01-07 LAB — TSH: TSH: 2.003 u[IU]/mL (ref 0.350–4.500)

## 2022-01-07 MED ORDER — INFLUENZA VAC A&B SA ADJ QUAD 0.5 ML IM PRSY
0.5000 mL | PREFILLED_SYRINGE | INTRAMUSCULAR | Status: DC
  Filled 2022-01-07: qty 0.5

## 2022-01-07 MED ORDER — POTASSIUM CHLORIDE CRYS ER 20 MEQ PO TBCR
20.0000 meq | EXTENDED_RELEASE_TABLET | Freq: Once | ORAL | Status: AC
  Administered 2022-01-07: 20 meq via ORAL
  Filled 2022-01-07: qty 1

## 2022-01-07 MED ORDER — DEXAMETHASONE SODIUM PHOSPHATE 10 MG/ML IJ SOLN
10.0000 mg | Freq: Once | INTRAMUSCULAR | Status: AC
  Administered 2022-01-07: 10 mg via INTRAVENOUS
  Filled 2022-01-07: qty 1

## 2022-01-07 MED ORDER — CYCLOBENZAPRINE HCL 10 MG PO TABS: 5.0000 mg | ORAL_TABLET | Freq: Three times a day (TID) | ORAL | Status: DC | PRN

## 2022-01-07 MED ORDER — KETOROLAC TROMETHAMINE 15 MG/ML IJ SOLN
15.0000 mg | Freq: Once | INTRAMUSCULAR | Status: AC
  Administered 2022-01-07: 15 mg via INTRAVENOUS
  Filled 2022-01-07: qty 1

## 2022-01-07 MED ORDER — OXYCODONE HCL 5 MG PO TABS: 5.0000 mg | ORAL_TABLET | ORAL | Status: DC | PRN

## 2022-01-07 MED ORDER — MOMETASONE FURO-FORMOTEROL FUM 100-5 MCG/ACT IN AERO
2.0000 | INHALATION_SPRAY | Freq: Two times a day (BID) | RESPIRATORY_TRACT | Status: DC
  Administered 2022-01-07 – 2022-01-08 (×2): 2 via RESPIRATORY_TRACT
  Filled 2022-01-07: qty 8.8

## 2022-01-07 MED ORDER — METHYLPREDNISOLONE SODIUM SUCC 40 MG IJ SOLR
40.0000 mg | Freq: Every day | INTRAMUSCULAR | Status: DC
  Administered 2022-01-07 – 2022-01-08 (×2): 40 mg via INTRAVENOUS
  Filled 2022-01-07 (×2): qty 1

## 2022-01-07 MED ORDER — MAGNESIUM SULFATE 2 GM/50ML IV SOLN
2.0000 g | Freq: Once | INTRAVENOUS | Status: AC
  Administered 2022-01-07: 2 g via INTRAVENOUS
  Filled 2022-01-07: qty 50

## 2022-01-07 MED ORDER — ORAL CARE MOUTH RINSE: 15.0000 mL | OROMUCOSAL | Status: DC | PRN

## 2022-01-07 MED ORDER — DICLOFENAC SODIUM 1 % EX GEL
2.0000 g | Freq: Two times a day (BID) | CUTANEOUS | Status: DC | PRN
  Filled 2022-01-07: qty 100

## 2022-01-07 MED ORDER — ASPIRIN-ACETAMINOPHEN-CAFFEINE 250-250-65 MG PO TABS
1.0000 | ORAL_TABLET | Freq: Three times a day (TID) | ORAL | Status: DC | PRN
  Filled 2022-01-07: qty 1

## 2022-01-07 MED ORDER — PANTOPRAZOLE SODIUM 40 MG PO TBEC
40.0000 mg | DELAYED_RELEASE_TABLET | Freq: Two times a day (BID) | ORAL | Status: DC
  Administered 2022-01-07 – 2022-01-08 (×2): 40 mg via ORAL
  Filled 2022-01-07 (×2): qty 1

## 2022-01-07 MED ORDER — PNEUMOCOCCAL 20-VAL CONJ VACC 0.5 ML IM SUSY
0.5000 mL | PREFILLED_SYRINGE | INTRAMUSCULAR | Status: DC
  Filled 2022-01-07: qty 0.5

## 2022-01-07 MED ORDER — ACETAMINOPHEN-CAFFEINE 500-65 MG PO TABS
1.0000 | ORAL_TABLET | Freq: Three times a day (TID) | ORAL | Status: DC | PRN
  Administered 2022-01-07 – 2022-01-08 (×2): 1 via ORAL
  Filled 2022-01-07 (×3): qty 1

## 2022-01-07 NOTE — Assessment & Plan Note (Addendum)
Patient received Decadron, Toradol and IV magnesium this morning.  Medications in the evening and again in the morning upon discharge.  Will prescribe Topamax 25 mg daily for headache prevention.  CT scan of the head was negative.  Patient does have some crepitus in the cervical spine.

## 2022-01-07 NOTE — Assessment & Plan Note (Signed)
Continue inhalers

## 2022-01-07 NOTE — Progress Notes (Signed)
Progress Note   Patient: [name] ION:[id number] DOB: [birth year] DOA: 01/06/2022     0 DOS: the patient was seen and examined on 01/07/2022   Brief hospital course: [name] is a 71 year old female with history of hypothyroid, GERD, who presents emergency department for chief concerns of chest pain and shortness of breath.  Initial vitals in the emergency department showed temperature of 98, respiration rate of 16, heart rate of 88, blood pressure 137/93, SPO2 of 98% on room air.  Serum sodium is 143, potassium 2.9, chloride 104, bicarb 27, BUN of 21, serum creatinine 0.78, GFR greater than 60, nonfasting blood glucose 99, WBC 17.2, hemoglobin 15.2, platelets of 279.  BNP was 36.8.  High sensitive troponin was 8 and on repeat was 8.  Magnesium level was within normal limits at 2.1.  UA was ordered and pending in the ED.  ED treatment: Tylenol 1 g p.o., Maalox, potassium chloride 40 mill equivalents p.o. one-time dose, potassium chloride 10 mill equivalent one-time dose IV.  Assessment and Plan: * Headache Patient received Decadron, Toradol and IV magnesium this morning.  Will try Excedrin Migraine this afternoon.  Chest pain CT scan of the chest negative for pulmonary embolism.  Cardiac enzymes are negative which goes along with not having a heart attack.  Patient does have some pain to palpation.  Patient given Decadron earlier we will give a dose of Solu-Medrol this afternoon.  We will try diclofenac gel.  We will also empirically place on twice daily PPI.  Reassess in a.m.  Acid reflux Increase Protonix to twice daily dosing.  Hypokalemia Potassium 2.6 on presentation and increased up to 3.6.  We will give a dose of oral potassium today.  COPD (chronic obstructive pulmonary disease) (HCC) Continue inhalers.  Hypothyroidism Continue levothyroxine        Subjective: Patient still having chest pain and headache on reassessment this afternoon.  Received  medications for headache this morning and will give more medications this afternoon.  Admitted with chest pain.  Physical Exam: Vitals:   01/06/22 2345 01/07/22 0359 01/07/22 0723 01/07/22 1205  BP: 104/63 (!) 114/55 (!) 124/56 116/68  Pulse: 62 (!) 56 82 62  Resp: '19 20 18 18  '$ Temp: 98.4 F (36.9 C) 99 F (37.2 C) 98.1 F (36.7 C) 99 F (37.2 C)  TempSrc: Oral Oral  Oral  SpO2: 97% 99% 98% 95%  Weight:      Height:       Physical Exam HENT:     Head: Normocephalic.     Mouth/Throat:     Pharynx: No oropharyngeal exudate.  Eyes:     General: Lids are normal.     Conjunctiva/sclera: Conjunctivae normal.  Cardiovascular:     Rate and Rhythm: Normal rate and regular rhythm.     Heart sounds: Normal heart sounds, S1 normal and S2 normal.     Comments: Pain to palpation over anterior chest wall. Pulmonary:     Breath sounds: No decreased breath sounds, wheezing, rhonchi or rales.  Abdominal:     Palpations: Abdomen is soft.     Tenderness: There is no abdominal tenderness.  Musculoskeletal:     Right lower leg: No swelling.     Left lower leg: No swelling.  Skin:    General: Skin is warm.     Findings: No rash.  Neurological:     Mental Status: She is alert and oriented to person, place, and time.     Data Reviewed: Troponin  negative x2, initial potassium 2.9 and increased to 3.6, creatinine 0.74, white blood cell count 17.2, hemoglobin 15.2, TSH 2.03, echocardiogram showed a normal EF of 60 to 65%  Family Communication: Updated patient's daughter on the phone  Disposition: Status is: Observation Received IV medications this morning for headache and will continue managing headache today.  Continue Solu-Medrol for chest wall pain likely costochondritis.  Reassess tomorrow.  Planned Discharge Destination: Home    Time spent: 28 minutes Author: Loletha Grayer, MD 01/07/2022 2:17 PM  For on call review [email or web address].

## 2022-01-08 ENCOUNTER — Observation Stay: Payer: Medicare Other

## 2022-01-08 DIAGNOSIS — F419 Anxiety disorder, unspecified: Secondary | ICD-10-CM

## 2022-01-08 DIAGNOSIS — K219 Gastro-esophageal reflux disease without esophagitis: Secondary | ICD-10-CM | POA: Diagnosis not present

## 2022-01-08 DIAGNOSIS — R079 Chest pain, unspecified: Secondary | ICD-10-CM | POA: Diagnosis not present

## 2022-01-08 DIAGNOSIS — R519 Headache, unspecified: Secondary | ICD-10-CM | POA: Diagnosis not present

## 2022-01-08 MED ORDER — PREDNISONE 20 MG PO TABS: ORAL_TABLET | ORAL | 0 refills | Status: DC

## 2022-01-08 MED ORDER — KETOROLAC TROMETHAMINE 15 MG/ML IJ SOLN
15.0000 mg | Freq: Once | INTRAMUSCULAR | Status: AC
  Administered 2022-01-08: 15 mg via INTRAVENOUS
  Filled 2022-01-08: qty 1

## 2022-01-08 MED ORDER — TOPIRAMATE 25 MG PO TABS: 25.0000 mg | ORAL_TABLET | Freq: Every day | ORAL | 0 refills | Status: DC

## 2022-01-08 MED ORDER — LORAZEPAM 2 MG/ML IJ SOLN
0.5000 mg | Freq: Once | INTRAMUSCULAR | Status: AC | PRN
  Administered 2022-01-08: 0.5 mg via INTRAVENOUS
  Filled 2022-01-08: qty 1

## 2022-01-08 MED ORDER — PANTOPRAZOLE SODIUM 40 MG PO TBEC: 40.0000 mg | DELAYED_RELEASE_TABLET | Freq: Two times a day (BID) | ORAL | 1 refills | Status: DC

## 2022-01-08 MED ORDER — MAGNESIUM SULFATE 2 GM/50ML IV SOLN
2.0000 g | Freq: Once | INTRAVENOUS | Status: AC
  Administered 2022-01-08: 2 g via INTRAVENOUS
  Filled 2022-01-08 (×2): qty 50

## 2022-01-08 NOTE — Discharge Instructions (Signed)
Recommend taking protonix twice a day or protonix in the morning and nexium '40mg'$  at night

## 2022-01-08 NOTE — Discharge Summary (Signed)
Physician Discharge Summary   Patient: [name] MRN: [id number] DOB: November 23, 1950  Admit date:     01/06/2022  Discharge date: 01/08/22  Discharge Physician: Loletha Grayer   PCP: [name], MD   Recommendations at discharge:   Follow-up PCP 5 days Recommend following up with your gastroenterologist.  Discharge Diagnoses: Principal Problem:   Headache Active Problems:   Chest pain   Hypothyroidism   Depression   Status post open reduction and internal fixation (ORIF) of fracture (right)   Anxiety   COPD (chronic obstructive pulmonary disease) (HCC)   Hypokalemia   Hyperlipidemia   OSA (obstructive sleep apnea)   Pulmonary hypertension (HCC)   Restless legs syndrome   S/P laparoscopic sleeve gastrectomy   Essential hypertension   Acid reflux    Hospital Course: [name] is a 71 year old female with history of hypothyroid, GERD, who presents emergency department for chief concerns of chest pain and shortness of breath.  Initial vitals in the emergency department showed temperature of 98, respiration rate of 16, heart rate of 88, blood pressure 137/93, SPO2 of 98% on room air.  Serum sodium is 143, potassium 2.9, chloride 104, bicarb 27, BUN of 21, serum creatinine 0.78, GFR greater than 60, nonfasting blood glucose 99, WBC 17.2, hemoglobin 15.2, platelets of 279.  BNP was 36.8.  High sensitive troponin was 8 and on repeat was 8.  Magnesium level was within normal limits at 2.1.  UA was ordered and pending in the ED.  ED treatment: Tylenol 1 g p.o., Maalox, potassium chloride 40 mill equivalents p.o. one-time dose, potassium chloride 10 mill equivalent one-time dose IV.  On 11/19, the patient was still having a lot of chest pain and headache.  Patient was given a few rounds of headache medications and Solu-Medrol for reproducible chest pain.  Her Protonix was increased to twice daily.  On 11/20, the patient feels that her symptoms may be GI in nature.   She is having symptoms after she eats.  Since she ate breakfast I was unable to order any testing for today.  The patient wanted to go home.  She states that she will follow-up with her gastroenterologist.  I advised her to eat 8 small meals a day.  Recommended Protonix twice a day dosing.  She states that she has an extra prescription of this at home.  She can also use Protonix once a day and over-the-counter Nexium in the evening.  The patient was still having headache and wanted something for preventative nature and was okay starting Topamax.  Assessment and Plan: * Headache Patient received Decadron, Toradol and IV magnesium this morning.  Medications in the evening and again in the morning upon discharge.  Will prescribe Topamax 25 mg daily for headache prevention.  CT scan of the head was negative.  Patient does have some crepitus in the cervical spine.  Chest pain CT scan of the chest negative for pulmonary embolism.  Cardiac enzymes are negative which goes along with not having a heart attack.  Patient does have some pain to palpation.  We will give a quick prednisone taper just in case costochondritis we will try diclofenac gel.  We will also empirically place on twice daily PPI.  Patient thinks her symptoms may be GI in nature.  Advised twice a day PPI as outpatient.  She will follow-up with her gastroenterologist as outpatient for  Acid reflux Increase Protonix to twice daily dosing.  Hypokalemia Potassium 2.6 on presentation and increased up to  3.6.    COPD (chronic obstructive pulmonary disease) (HCC) Continue inhalers.  Hypothyroidism Continue levothyroxine         Consultants: None Procedures performed: None Disposition: Home Diet recommendation:  Cardiac diet DISCHARGE MEDICATION: Allergies as of 01/08/2022       Reactions   Bee Venom Anaphylaxis   Wasp Venom Anaphylaxis   Codeine Nausea Only   Pt reported nausea to nurse when asked about allergy on 10/27/14    Hydrocodone-acetaminophen Other (See Comments)   Hallucinations     Mango Flavor Hives   Oxycodone Itching, Nausea Only   Procaine Hcl Nausea Only   Ineffective  Novocain*    Tramadol Nausea And Vomiting   Latex Rash        Medication List     STOP taking these medications    diphenhydrAMINE 25 MG tablet Commonly known as: BENADRYL   Movantik 25 MG Tabs tablet Generic drug: naloxegol oxalate   potassium chloride SA 20 MEQ tablet Commonly known as: KLOR-CON M   promethazine 25 MG tablet Commonly known as: PHENERGAN       TAKE these medications    albuterol 108 (90 Base) MCG/ACT inhaler Commonly known as: VENTOLIN HFA Inhale 2 puffs into the lungs every 6 (six) hours as needed (COPD).   BARIATRIC MULTIVITAMINS/IRON PO Take 1 tablet by mouth daily.   budesonide-formoterol 80-4.5 MCG/ACT inhaler Commonly known as: SYMBICORT Inhale 2 puffs into the lungs daily as needed for shortness of breath.   diclofenac Sodium 1 % Gel Commonly known as: VOLTAREN Apply 2 g topically 2 (two) times daily as needed for pain.   EPINEPHrine 0.3 mg/0.3 mL Soaj injection Commonly known as: EPI-PEN Inject 0.3 mg into the muscle as needed for anaphylaxis.   levothyroxine 88 MCG tablet Commonly known as: SYNTHROID Take 88 mcg by mouth daily before breakfast. What changed: Another medication with the same name was removed. Continue taking this medication, and follow the directions you see here.   Linzess 290 MCG Caps capsule Generic drug: linaclotide TAKE 1 CAPSULE BY MOUTH EVERY DAY BEFORE BREAKFAST   oxyCODONE 5 MG immediate release tablet Commonly known as: Roxicodone Take 1 tablet (5 mg total) by mouth every 4 (four) hours as needed for severe pain.   pantoprazole 40 MG tablet Commonly known as: PROTONIX Take 1 tablet (40 mg total) by mouth 2 (two) times daily. What changed: when to take this   predniSONE 20 MG tablet Commonly known as: DELTASONE Two tabs daily for  three days Start taking on: January 09, 2022   topiramate 25 MG tablet Commonly known as: Topamax Take 1 tablet (25 mg total) by mouth at bedtime.   Viactiv Calcium Plus D 650-12.5-40 MG-MCG-MCG Chew Generic drug: Calcium-Vitamin D-Vitamin K Chew 2 each by mouth daily.               Durable Medical Equipment  (From admission, onward)           Start     Ordered   01/08/22 1218  For home use only DME 4 wheeled rolling walker with seat  Once       Question:  Patient needs a walker to treat with the following condition  Answer:  Unsteady gait when walking   01/08/22 1217            Follow-up Information     [name], MD. Schedule an appointment as soon as possible for a visit in 1 week(s).   Specialty: Family Medicine Why:  Dr. Hayes Ludwig nurse will call you directly to schedule this appointment. Please call if you do not hear from them within 48 hours. Contact information: [address] [facility] [place] [address] [phone]         follow up your gastroenterologist Follow up in 2 week(s).                 Discharge Exam: Filed Weights   01/06/22 1001  Weight: 74.8 kg   Physical Exam HENT:     Head: Normocephalic.     Mouth/Throat:     Pharynx: No oropharyngeal exudate.  Eyes:     General: Lids are normal.     Conjunctiva/sclera: Conjunctivae normal.  Cardiovascular:     Rate and Rhythm: Normal rate and regular rhythm.     Heart sounds: Normal heart sounds, S1 normal and S2 normal.     Comments: Pain to palpation over anterior chest wall. Pulmonary:     Breath sounds: No decreased breath sounds, wheezing, rhonchi or rales.  Abdominal:     Palpations: Abdomen is soft.     Tenderness: There is no abdominal tenderness.  Musculoskeletal:     Right lower leg: No swelling.     Left lower leg: No swelling.  Skin:    General: Skin is warm.     Findings: No rash.  Neurological:     Mental Status: She  is alert and oriented to person, place, and time.      Condition at discharge: stable  The results of significant diagnostics from this hospitalization (including imaging, microbiology, ancillary and laboratory) are listed below for reference.   Imaging Studies: CT HEAD WO CONTRAST (5MM)  Result Date: 01/08/2022 CLINICAL DATA:  Headache, increasing frequency or severity EXAM: CT HEAD WITHOUT CONTRAST TECHNIQUE: Contiguous axial images were obtained from the base of the skull through the vertex without intravenous contrast. RADIATION DOSE REDUCTION: This exam was performed according to the departmental dose-optimization program which includes automated exposure control, adjustment of the mA and/or kV according to patient size and/or use of iterative reconstruction technique. COMPARISON:  Head CT 05/17/2016. FINDINGS: Brain: No evidence of acute intracranial hemorrhage or extra-axial collection. No loss of gray-white matter differentiation.No evidence of mass lesion/concerning mass effect.The ventricles are normal in size. Vascular: No hyperdense vessel or unexpected calcification. Skull: Mild hyperostosis frontalis internus.  Negative for fracture. Sinuses/Orbits: Minimal ethmoid air cells mucosal thickening. Orbits are unremarkable. Other: None. IMPRESSION: No acute intracranial abnormality. Electronically Signed   By: Maurine Simmering M.D.   On: 01/08/2022 11:29   ECHOCARDIOGRAM COMPLETE  Result Date: 01/06/2022    ECHOCARDIOGRAM REPORT   Patient Name:   [name] Date of Exam: 01/06/2022 Medical Rec #:  [id number]      Height:       60.0 in Accession #:    [id number]     Weight:       165.0 lb Date of Birth:  January 14, 1951      BSA:          1.720 m Patient Age:    25 years       BP:           137/93 mmHg Patient Gender: F              HR:           64 bpm. Exam Location:  [facility] Procedure: 2D Echo and Intracardiac Opacification Agent Indications:     Dyspnea R06.00  Chest Pain R07.9   History:         Patient has prior history of Echocardiogram examinations, most                  recent 06/23/2015.  Sonographer:     Kathlen Brunswick RDCS Referring Phys:  [id number] AMY N COX Diagnosing Phys: Neoma Laming  Sonographer Comments: Technically challenging study due to limited acoustic windows, suboptimal apical window, suboptimal subcostal window and suboptimal parasternal window. Image acquisition challenging due to respiratory motion. IMPRESSIONS  1. Left ventricular ejection fraction, by estimation, is 60 to 65%. The left ventricle has normal function. The left ventricle has no regional wall motion abnormalities. There is moderate concentric left ventricular hypertrophy. Left ventricular diastolic parameters are consistent with Grade I diastolic dysfunction (impaired relaxation).  2. Right ventricular systolic function is normal. The right ventricular size is normal.  3. Left atrial size was moderately dilated.  4. Right atrial size was mildly dilated.  5. The mitral valve is normal in structure. Trivial mitral valve regurgitation. No evidence of mitral stenosis.  6. The aortic valve is normal in structure. Aortic valve regurgitation is not visualized. No aortic stenosis is present.  7. The inferior vena cava is normal in size with greater than 50% respiratory variability, suggesting right atrial pressure of 3 mmHg. FINDINGS  Left Ventricle: Left ventricular ejection fraction, by estimation, is 60 to 65%. The left ventricle has normal function. The left ventricle has no regional wall motion abnormalities. Definity contrast agent was given IV to delineate the left ventricular  endocardial borders. The left ventricular internal cavity size was normal in size. There is moderate concentric left ventricular hypertrophy. Left ventricular diastolic parameters are consistent with Grade I diastolic dysfunction (impaired relaxation). Right Ventricle: The right ventricular size is normal. No increase in right  ventricular wall thickness. Right ventricular systolic function is normal. Left Atrium: Left atrial size was moderately dilated. Right Atrium: Right atrial size was mildly dilated. Pericardium: There is no evidence of pericardial effusion. Mitral Valve: The mitral valve is normal in structure. Trivial mitral valve regurgitation. No evidence of mitral valve stenosis. Tricuspid Valve: The tricuspid valve is normal in structure. Tricuspid valve regurgitation is trivial. No evidence of tricuspid stenosis. Aortic Valve: The aortic valve is normal in structure. Aortic valve regurgitation is not visualized. No aortic stenosis is present. Aortic valve peak gradient measures 11.6 mmHg. Pulmonic Valve: The pulmonic valve was normal in structure. Pulmonic valve regurgitation is not visualized. No evidence of pulmonic stenosis. Aorta: The aortic root is normal in size and structure. Venous: The inferior vena cava is normal in size with greater than 50% respiratory variability, suggesting right atrial pressure of 3 mmHg. IAS/Shunts: No atrial level shunt detected by color flow Doppler.  LEFT VENTRICLE PLAX 2D LVIDd:         4.30 cm      Diastology LVIDs:         3.00 cm      LV e' medial:    6.09 cm/s LV PW:         1.30 cm      LV E/e' medial:  9.3 LV IVS:        1.20 cm      LV e' lateral:   6.64 cm/s LVOT diam:     1.80 cm      LV E/e' lateral: 8.6 LV SV:         64 LV SV Index:   37 LVOT  Area:     2.54 cm  LV Volumes (MOD) LV vol d, MOD A2C: 113.0 ml LV vol d, MOD A4C: 86.7 ml LV vol s, MOD A2C: 44.4 ml LV vol s, MOD A4C: 30.8 ml LV SV MOD A2C:     68.6 ml LV SV MOD A4C:     86.7 ml LV SV MOD BP:      65.8 ml LEFT ATRIUM           Index LA diam:      3.40 cm 1.98 cm/m LA Vol (A4C): 25.9 ml 15.06 ml/m  AORTIC VALVE                 PULMONIC VALVE AV Area (Vmax): 1.67 cm     PV Vmax:       1.06 m/s AV Vmax:        170.50 cm/s  PV Peak grad:  4.5 mmHg AV Peak Grad:   11.6 mmHg LVOT Vmax:      112.00 cm/s LVOT Vmean:      78.800 cm/s LVOT VTI:       0.251 m  AORTA Ao Root diam: 3.10 cm MITRAL VALVE               TRICUSPID VALVE MV Area (PHT): 2.39 cm    TR Peak grad:   19.4 mmHg MV Decel Time: 318 msec    TR Vmax:        220.00 cm/s MV E velocity: 56.90 cm/s MV A velocity: 82.75 cm/s  SHUNTS MV E/A ratio:  0.69        Systemic VTI:  0.25 m                            Systemic Diam: 1.80 cm Neoma Laming Electronically signed by Neoma Laming Signature Date/Time: 01/06/2022/9:02:53 PM    Final    CT Angio Chest PE W and/or Wo Contrast  Result Date: 01/06/2022 CLINICAL DATA:  Concern for pulmonary embolism EXAM: CT ANGIOGRAPHY CHEST WITH CONTRAST TECHNIQUE: Multidetector CT imaging of the chest was performed using the standard protocol during bolus administration of intravenous contrast. Multiplanar CT image reconstructions and MIPs were obtained to evaluate the vascular anatomy. RADIATION DOSE REDUCTION: This exam was performed according to the departmental dose-optimization program which includes automated exposure control, adjustment of the mA and/or kV according to patient size and/or use of iterative reconstruction technique. CONTRAST:  75m OMNIPAQUE IOHEXOL 350 MG/ML SOLN COMPARISON:  CT angio chest 02/10/2019 FINDINGS: Cardiovascular: No filling defects within the pulmonary arteries to suggest acute pulmonary embolism. LEFT-sided SVC. Mediastinum/Nodes: No axillary or supraclavicular adenopathy. No mediastinal or hilar adenopathy. No pericardial fluid. Esophagus normal. Lungs/Pleura: No pulmonary infarction. No pneumonia. No pleural fluid. No pneumothorax Upper Abdomen: Limited view of the liver, kidneys, pancreas are unremarkable. Normal adrenal glands. Post bariatric surgery Musculoskeletal: No aggressive osseous lesion. Review of the MIP images confirms the above findings. IMPRESSION: 1. No acute pulmonary embolism. 2. No acute pulmonary parenchymal findings 3. 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  Electronically Signed   By: SSuzy BouchardM.D.   On: 01/06/2022 18:38   DG Chest 2 View  Result Date: 01/06/2022 CLINICAL DATA:  Chest pain. EXAM: CHEST - 2 VIEW COMPARISON:  February 04, 2020 FINDINGS: The heart size and mediastinal contours are within normal limits. Both lungs are clear. The visualized skeletal structures are unremarkable. IMPRESSION: No active cardiopulmonary disease. Electronically Signed  By: Dorise Bullion III M.D.   On: 01/06/2022 10:30    Microbiology: Results for orders placed or performed during the hospital encounter of 01/06/22  Resp Panel by RT-PCR (Flu A&B, Covid) Anterior Nasal Swab     Status: None   Collection Time: 01/06/22  3:11 PM   Specimen: Anterior Nasal Swab  Result Value Ref Range Status   SARS Coronavirus 2 by RT PCR NEGATIVE NEGATIVE Final    Comment: (NOTE) SARS-CoV-2 target nucleic acids are NOT DETECTED.  The SARS-CoV-2 RNA is generally detectable in upper respiratory specimens during the acute phase of infection. The lowest concentration of SARS-CoV-2 viral copies this assay can detect is 138 copies/mL. A negative result does not preclude SARS-Cov-2 infection and should not be used as the sole basis for treatment or other patient management decisions. A negative result may occur with  improper specimen collection/handling, submission of specimen other than nasopharyngeal swab, presence of viral mutation(s) within the areas targeted by this assay, and inadequate number of viral copies(<138 copies/mL). A negative result must be combined with clinical observations, patient history, and epidemiological information. The expected result is Negative.  Fact Sheet for Patients:  EntrepreneurPulse.com.au  Fact Sheet for Healthcare Providers:  IncredibleEmployment.be  This test is no t yet approved or cleared by the [place] FDA and  has been authorized for detection and/or diagnosis of SARS-CoV-2  by FDA under an Emergency Use Authorization (EUA). This EUA will remain  in effect (meaning this test can be used) for the duration of the COVID-19 declaration under Section 564(b)(1) of the Act, 21 U.S.C.section 360bbb-3(b)(1), unless the authorization is terminated  or revoked sooner.       Influenza A by PCR NEGATIVE NEGATIVE Final   Influenza B by PCR NEGATIVE NEGATIVE Final    Comment: (NOTE) The Xpert Xpress SARS-CoV-2/FLU/RSV plus assay is intended as an aid in the diagnosis of influenza from Nasopharyngeal swab specimens and should not be used as a sole basis for treatment. Nasal washings and aspirates are unacceptable for Xpert Xpress SARS-CoV-2/FLU/RSV testing.  Fact Sheet for Patients: EntrepreneurPulse.com.au  Fact Sheet for Healthcare Providers: IncredibleEmployment.be  This test is not yet approved or cleared by the [place] FDA and has been authorized for detection and/or diagnosis of SARS-CoV-2 by FDA under an Emergency Use Authorization (EUA). This EUA will remain in effect (meaning this test can be used) for the duration of the COVID-19 declaration under Section 564(b)(1) of the Act, 21 U.S.C. section 360bbb-3(b)(1), unless the authorization is terminated or revoked.  Performed at [facility], [address] [place], [place] [address]     Labs: CBC: Recent Labs  Lab 01/06/22 1006  WBC 17.2*  HGB 15.2*  HCT 43.5  MCV 87.9  PLT 694   Basic Metabolic Panel: Recent Labs  Lab 01/06/22 1006 01/07/22 0758  NA 143 139  K 2.9* 3.6  CL 104 102  CO2 27 30  GLUCOSE 99 101*  BUN 21 17  CREATININE 0.78 0.74  CALCIUM 9.0 8.7*  MG 2.1  --    Liver Function Tests: Recent Labs  Lab 01/06/22 1006  AST 20  ALT 19  ALKPHOS 74  BILITOT 1.1  PROT 6.8  ALBUMIN 3.7   CBG: No results for input(s): "GLUCAP" in the last 168 hours.  Discharge time spent: greater than 30 minutes.  Signed: Loletha Grayer, MD [facility] 01/08/2022

## 2022-01-08 NOTE — Evaluation (Signed)
Physical Therapy Evaluation Patient Details Name: [name] MRN: [id number] DOB: Feb 26, 1950 Today's Date: 01/08/2022  History of Present Illness  [name] is a 43yoF who [facility] on [facility] c CP and SOB. PMH: hypoTSH, GERD.  Chest CT negaitve. K+: 2.9.  Clinical Impression  Pt denies any acute imbalance for loss of BLE strength. Pt has been AMB independently in room today and previous day. Pt demonstrates independent AMB on unit and performance of TUG test 14s, then modI performance of 12 stairs (6 up/6 down). No skilled services warranted in this setting, non recommended at DC. Pt reports some DOE with longer distance AMB due to COPD, her 4ww was destroyed a while back, would like another for DC. Author agrees this would help pt manage her out of home mobility needs in the setting of COPD and DOE. Will sign off at this time, recommend ad lib mobility in room to prevent deconditioning while admitted.        Recommendations for follow up therapy are one component of a multi-disciplinary discharge planning process, led by the attending physician.  Recommendations may be updated based on patient status, additional functional criteria and insurance authorization.  Follow Up Recommendations No PT follow up      Assistance Recommended at Discharge None  Patient can return home with the following  Help with stairs or ramp for entrance    Equipment Recommendations Rollator (4 wheels)  Recommendations for Other Services       Functional Status Assessment Patient has not had a recent decline in their functional status     Precautions / Restrictions Precautions Precautions: None Restrictions Weight Bearing Restrictions: No      Mobility  Bed Mobility Overal bed mobility: Independent                  Transfers Overall transfer level: Independent Equipment used: None                    Ambulation/Gait Ambulation/Gait assistance: Independent Gait  Distance (Feet): 290 Feet Assistive device: None Gait Pattern/deviations: WFL(Within Functional Limits) Gait velocity: 1.73ms     General Gait Details: mild DOE after 2033f Stairs Stairs: Yes Stairs assistance: Modified independent (Device/Increase time) Stair Management: One rail Right, Alternating pattern, Forwards Number of Stairs: 12 General stair comments: confeidence is high  Wheelchair Mobility    Modified Rankin (Stroke Patients Only)       Balance Overall balance assessment: Independent                               Standardized Balance Assessment Standardized Balance Assessment : TUG: Timed Up and Go Test     Timed Up and Go Test TUG: Normal TUG Normal TUG (seconds): 14.26     Pertinent Vitals/Pain Pain Assessment Pain Assessment: 0-10 Pain Score: 4  Pain Location: HA Pain Intervention(s): Limited activity within patient's tolerance, Monitored during session, Premedicated before session    Home Living Family/patient expects to be discharged to:: Private residence Living Arrangements: Other relatives;Spouse/significant other;Children (fiance (with dementia), DTR (HF patient); 2 adult grandchildren who do not drive) Available Help at Discharge: Family Type of Home: Mobile home Home Access: Stairs to enter Entrance Stairs-Rails: Left;Right;Can reach both Entrance Stairs-Number of Steps: 4-5   Home Layout: One level Home Equipment: None;Cane - single point      Prior Function Prior Level of Function : Driving  Mobility Comments: no device used at baseline ADLs Comments: independent     Hand Dominance   Dominant Hand: Right    Extremity/Trunk Assessment   Upper Extremity Assessment Upper Extremity Assessment: Overall WFL for tasks assessed    Lower Extremity Assessment Lower Extremity Assessment: Overall WFL for tasks assessed    Cervical / Trunk Assessment Cervical / Trunk Assessment: Normal   Communication      Cognition Arousal/Alertness: Awake/alert Behavior During Therapy: WFL for tasks assessed/performed Overall Cognitive Status: Within Functional Limits for tasks assessed                                          General Comments General comments (skin integrity, edema, etc.): brief pause upon standing to assure no dizziness prior to commencement of AMB    Exercises     Assessment/Plan    PT Assessment Patient does not need any further PT services  PT Problem List Decreased activity tolerance       PT Treatment Interventions      PT Goals (Current goals can be found in the Care Plan section)  Acute Rehab PT Goals PT Goal Formulation: All assessment and education complete, DC therapy    Frequency       Co-evaluation               AM-PAC PT "6 Clicks" Mobility  Outcome Measure Help needed turning from your back to your side while in a flat bed without using bedrails?: None Help needed moving from lying on your back to sitting on the side of a flat bed without using bedrails?: None Help needed moving to and from a bed to a chair (including a wheelchair)?: None Help needed standing up from a chair using your arms (e.g., wheelchair or bedside chair)?: None Help needed to walk in hospital room?: None Help needed climbing 3-5 steps with a railing? : None 6 Click Score: 24    End of Session Equipment Utilized During Treatment: Gait belt Activity Tolerance: Patient tolerated treatment well;No increased pain Patient left: in bed;with nursing/sitter in room Nurse Communication: Other (comment) (no central monitoring, battery in tele is very low) PT Visit Diagnosis: Other abnormalities of gait and mobility (R26.89)    Time: 8563-1497 PT Time Calculation (min) (ACUTE ONLY): 15 min   Charges:   PT Evaluation $PT Eval Low Complexity: 1 Low         10:15 AM, 01/08/22 Etta Grandchild, PT, DPT Physical Therapist - [facility]  [phone] ([place])    [name] C 01/08/2022, 10:12 AM

## 2022-01-08 NOTE — TOC CM/SW Note (Signed)
Patient has orders to discharge home today. Ordered rollator through [employer] to be delivered to the room before she leaves. No other TOC needs identified. Her fiance's sister will transport her home. CSW signing off.  Dayton Scrape, [place]

## 2022-01-08 NOTE — Progress Notes (Signed)
       CROSS COVER NOTE  NAME: [name] MRN: [id number] DOB : 11-08-1950 ATTENDING PHYSICIAN: Loletha Grayer, MD    Date of Service   01/08/2022   HPI/Events of Note   Medication request received for patient report of headache refractory to Excedrin. Patient reports improvement in headache with Toradol this morning.  Interventions   Assessment/Plan:  Toradol x1     This document was prepared using Dragon voice recognition software and may include unintentional dictation errors.  Neomia Glass DNP, MBA, FNP-BC Nurse Practitioner [facility] Pager [phone]

## 2022-01-08 NOTE — Care Management Obs Status (Signed)
[facility] NOTIFICATION   Patient Details  Name: [name] MRN: [id number] Date of Birth: Aug 01, 1950   Medicare Observation Status Notification Given:  Yes    Candie Chroman, LCSW 01/08/2022, 12:14 PM

## 2022-01-09 ENCOUNTER — Telehealth: Payer: Self-pay | Admitting: Internal Medicine

## 2022-01-09 NOTE — Telephone Encounter (Signed)
Inbound call from patient stating that she was just released out of the hosptial and they advised her to see Dr. [name]. Patient stated that she has been having issues when eating that her food just stays in the chest area and then she eventually will start coughing and get sick. Patient is requesting a call back to discuss and to see if there is anyway she can be seen soon with Dr. [name]. Please advise.

## 2022-01-10 NOTE — Telephone Encounter (Signed)
Dr. Vena Rua first available appt is 1/9. Pt scheduled to see Vicie Mutters PA 01/16/22 at 9am. Left message for pt to call and confirm if she wants to keep the appt on 11/28.  Pt called back and will keep the appt.

## 2022-01-15 NOTE — Progress Notes (Signed)
01/16/2022 [name] [id number] 1950-12-23  Referring provider: [name], MD Primary GI doctor: Dr. [name]  ASSESSMENT AND PLAN:   Esophageal dysphagia with non cardiac chest pain Troponin negative, echo unremarkable Worse with food, sounds GI in origin.  EGD 2017 with 2 cm hiatal hernia Will plan on EGD with dilatation to evaluate for stenosis, tumor, erosive/infectious esophagitis with prednisone and Symbicort use very suspicious. If the EGD is negative will plan for barium swallow  Will increase PPI to twice daily, emphasizing before food. and Will add on carafate. I discussed risks of EGD with [name] today, including risk of sedation, bleeding or perforation.  [name] provides understanding and gave verbal consent to proceed.  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 [name], good bowel prep, diverticulosis sigmoid colon otherwise unremarkable consider repeat colonoscopy in 5 years for surveillance versus discontinuation based on age.  History of Present Illness:  71 y.o. female  with a past medical history of OSA with COPD overlap, pulmonary hypertension, complex regional pain syndrome, status post laparoscopic gastric sleeve, migraines, fatty liver, history of C. difficile, personal history of colon polyps, IBS and others listed below, returns to clinic today for evaluation of GERD.  2017 Upper endoscopy with Dr. [name] for history of GERD and dysphagia showed 2 cm hiatal hernia, esophagus, stomach and duodenum normal. 06/12/2021 colonoscopy Dr. [name], good bowel prep, diverticulosis sigmoid colon otherwise unremarkable  consider repeat colonoscopy in 5 years for surveillance versus discontinuation based on age.  She was admitted to the hospital 11/18-11/20 for chest pain and dyspnea.  She had negative troponin, normal BNP of 36. BUN 21, Cr 0.78, WBC 17.2, HGB 15.2 and platelets 279. CTA chest negative PE. Some pain on palpation, give prednisone taper and diclofenac gel.  01/06/2022 Echo for CP/DOE showed EF 81-01%, grade 1 diastolic, RVSP normal, no AS.  She does have COPD and is on inhalers.  Thought chest pain was more GI in nature with worsening symptoms after food. Increase protonix to BID.  She states she has been having a lot of chest pain with eating/drinking for last 4 weeks.  Will feel like it stops substernal chest with a lot of burping and belching.  They wanted to do EGD or swallow study but she declined and left, wanted it to be done by Dr. [name].  She states she was in so much pain she was on IV morphine and toradol.  She has not lost any weight, has had some weight gain, she states she has had some mild swelling, is on lasix 20 mg once a day. She continues to have some SOB.  Chest pain and SOB with eating, non exertional.  She has been on prednisone several times in last 2-3 months, last given in hospital and currently off.  She denies NSAIDS or ETOH.  Can have nausea with vomiting or have regurgitation of food that gets stuck without nausea associated.  No fever but has had some chills.  She has not used her symbicort in several months, she has albuterol.  She denies melena, AB pain.  She  reports AB bloating.  She denies nocturnal symptoms.    She has opioid induced  constipation, on linzess 290 and just had movantik added but was not getting the movantik in the hospital so she has not had good Bm's since that time. Denies hematocheza.   She  reports that she has never smoked. She has never used smokeless tobacco. She reports that she does not drink alcohol and does not use drugs. Her  family history includes Colon polyps in her brother; Diabetes in her maternal aunt, mother, and sister; Esophageal cancer in her maternal grandfather; Heart disease in her father and maternal uncle; Irritable bowel syndrome in her brother.   Current Medications:   Current Outpatient Medications (Endocrine & Metabolic):    levothyroxine (SYNTHROID) 88 MCG tablet, Take 88 mcg by mouth daily before breakfast.  Current Outpatient Medications (Cardiovascular):    EPINEPHrine 0.3 mg/0.3 mL IJ SOAJ injection, Inject 0.3 mg into the muscle as needed for anaphylaxis.   furosemide (LASIX) 20 MG tablet, Take 20 mg by mouth daily.  Current Outpatient Medications (Respiratory):    albuterol (VENTOLIN HFA) 108 (90 Base) MCG/ACT inhaler, Inhale 2 puffs into the lungs every 6 (six) hours as needed (COPD).   budesonide-formoterol (SYMBICORT) 80-4.5 MCG/ACT inhaler, Inhale 2 puffs into the lungs daily as needed for shortness of breath.   magic mouthwash w/lidocaine SOLN*, Take 5 mLs by mouth every 6 (six) hours as needed for mouth pain.   promethazine (PHENERGAN) 25 MG suppository, Place 25 mg rectally every 6 (six) hours as needed for nausea or vomiting.  Current Outpatient Medications (Analgesics):    oxyCODONE (ROXICODONE) 5 MG immediate release tablet, Take 1 tablet (5 mg total) by mouth every 4 (four) hours as needed for severe pain.   Current Outpatient Medications (Other):    Calcium-Vitamin D-Vitamin K (VIACTIV CALCIUM PLUS D) 650-12.5-40 MG-MCG-MCG CHEW, Chew 2 each by mouth daily.   diclofenac Sodium (VOLTAREN) 1 % GEL, Apply 2 g topically 2 (two) times daily as needed for pain.   LINZESS 290 MCG CAPS capsule, TAKE 1 CAPSULE BY MOUTH EVERY DAY BEFORE BREAKFAST   magic mouthwash w/lidocaine SOLN*, Take 5 mLs by mouth every 6 (six) hours as needed for mouth pain.   Multiple Vitamins-Minerals (BARIATRIC MULTIVITAMINS/IRON PO), Take 1 tablet by mouth daily.   pantoprazole (PROTONIX) 40 MG tablet,  Take 1 tablet (40 mg total) by mouth 2 (two) times daily.   potassium chloride (KLOR-CON M) 10 MEQ tablet, Take 10 mEq by mouth 2 (two) times daily.   sucralfate (CARAFATE) 1 g tablet, Take 1 tablet (1 g total) by mouth 3 (three) times daily before meals. * These medications belong to multiple therapeutic classes and are listed under each applicable group.  Surgical History:  She  has a past surgical history that includes Knee arthrocentesis (Left); Wrist Arthroplasty (Left); Abdominal hysterectomy; Carpal tunnel release (Bilateral); Cardiac catheterization (06/15/2014); Anterior cervical decomp/discectomy fusion (N/A, 10/27/2014); Colonoscopy; Upper gastrointestinal endoscopy; Wound debridement (Right); Tendon repair (Right); Wrist surgery (Right, 2017); ORIF wrist fracture (Left, 02/05/2020); Carpal tunnel release (Left, 02/05/2020); Laparoscopic gastric sleeve resection (10/16/2018); Hardware Removal (Left, 10/06/2020); Trigger finger release (Left, 10/06/2020); Cyst excision (Left, 10/06/2020); Multiple extractions with alveoloplasty (Bilateral, 01/17/2021); Tooth Extraction (N/A, 03/24/2021); and Radiology with anesthesia (Right, 09/19/2021).  Current Medications, Allergies, Past Medical History, Past Surgical History, Family History and Social History were reviewed in [employer] record.  Physical Exam: BP 130/70   Pulse 61   Ht 5' (1.524 m)   Wt 181 lb (82.1 kg)   BMI 35.35 kg/m  General:   Pleasant,  obese female in no acute distress Heart : Regular rate and rhythm; systolic murmur Pulm: Clear anteriorly; no wheezing Abdomen:  Soft, Obese AB, Active bowel sounds. mild tenderness in the epigastrium. Without guarding and Without rebound, No organomegaly appreciated. Rectal: Not evaluated Extremities:  with mild edema, upper buttocks with erythematous pressure ulcer, no warmth, no discharge.  Neurologic:  Alert and  oriented x4;  No focal deficits.  Psych:  Cooperative.  Normal mood and affect.   Vladimir Crofts, PA-C 01/16/22

## 2022-01-16 ENCOUNTER — Encounter: Payer: Self-pay | Admitting: Physician Assistant

## 2022-01-16 ENCOUNTER — Ambulatory Visit (INDEPENDENT_AMBULATORY_CARE_PROVIDER_SITE_OTHER): Payer: Medicare Other | Admitting: Physician Assistant

## 2022-01-16 VITALS — BP 130/70 | HR 61 | Ht 60.0 in | Wt 181.0 lb

## 2022-01-16 DIAGNOSIS — L89309 Pressure ulcer of unspecified buttock, unspecified stage: Secondary | ICD-10-CM | POA: Diagnosis not present

## 2022-01-16 DIAGNOSIS — R0789 Other chest pain: Secondary | ICD-10-CM

## 2022-01-16 DIAGNOSIS — R1319 Other dysphagia: Secondary | ICD-10-CM | POA: Diagnosis not present

## 2022-01-16 MED ORDER — MAGIC MOUTHWASH W/LIDOCAINE: 5.0000 mL | Freq: Four times a day (QID) | ORAL | 2 refills | Status: DC | PRN

## 2022-01-16 MED ORDER — SUCRALFATE 1 G PO TABS: 1.0000 g | ORAL_TABLET | Freq: Three times a day (TID) | ORAL | 0 refills | Status: DC

## 2022-01-16 NOTE — Patient Instructions (Addendum)
Please take your proton pump inhibitor medication, protonix 40 mg BID  Please take this medication 30 minutes to 1 hour before meals- this makes it more effective.  Avoid spicy and acidic foods Avoid fatty foods Limit your intake of coffee, tea, alcohol, and carbonated drinks Work to maintain a healthy weight Keep the head of the bed elevated at least 3 inches with blocks or a wedge pillow if you are having any nighttime symptoms Stay upright for 2 hours after eating Avoid meals and snacks three to four hours before bedtime  Sending a medication called Carafate, this mechanically coats your stomach. Can cause constipation and darker stools. Take about 30 mins to 1 hour before food and before bed.  If the pill is too large to take, can dissolve it in water and a small orange juice glass or shot glass and take it more as a liquid.   BAND-AID Brand HYDRO SEAL Hydrocolloid Gel All Purpose Bandages Go to pharmacy and get this for your wound Follow up with PCP Call them if any signs of infection like warmth, discharge, it gets worse.    Diverticulosis Diverticulosis is a condition that develops when small pouches (diverticula) form in the wall of the large intestine (colon). The colon is where water is absorbed and stool (feces) is formed. The pouches form when the inside layer of the colon pushes through weak spots in the outer layers of the colon. You may have a few pouches or many of them. The pouches usually do not cause problems unless they become inflamed or infected. When this happens, the condition is called diverticulitis- this is left lower quadrant pain, diarrhea, fever, chills, nausea or vomiting.  If this occurs please call the office or go to the hospital. Sometimes these patches without inflammation can also have painless bleeding associated with them, if this happens please call the office or go to the hospital. Preventing constipation and increasing fiber can help reduce  diverticula and prevent complications. Even if you feel you have a high-fiber diet, suggest getting on Benefiber or Cirtracel 2 times daily.  I appreciate the opportunity to care for you. Vicie Mutters, PA-C

## 2022-01-23 ENCOUNTER — Encounter: Payer: Self-pay | Admitting: Internal Medicine

## 2022-01-23 ENCOUNTER — Ambulatory Visit (AMBULATORY_SURGERY_CENTER): Payer: Medicare Other | Admitting: Internal Medicine

## 2022-01-23 VITALS — BP 140/70 | HR 70 | Temp 97.3°F | Resp 20 | Ht 60.0 in | Wt 181.0 lb

## 2022-01-23 DIAGNOSIS — K2100 Gastro-esophageal reflux disease with esophagitis, without bleeding: Secondary | ICD-10-CM

## 2022-01-23 DIAGNOSIS — R1319 Other dysphagia: Secondary | ICD-10-CM | POA: Diagnosis not present

## 2022-01-23 DIAGNOSIS — K449 Diaphragmatic hernia without obstruction or gangrene: Secondary | ICD-10-CM | POA: Diagnosis not present

## 2022-01-23 MED ORDER — SODIUM CHLORIDE 0.9 % IV SOLN: 500.0000 mL | Freq: Once | INTRAVENOUS | Status: DC

## 2022-01-23 NOTE — Op Note (Signed)
[place] Patient Name: [name] Procedure Date: 01/23/2022 8:58 AM MRN: [id number] Endoscopist: Jerene Bears , MD, [id number] Age: 71 Referring MD:  Date of Birth: 21-Jan-1951 Gender: Female Account #: [id number] Procedure:                Upper GI endoscopy Indications:              Dysphagia, Heartburn, last EGD 2017 Medicines:                Monitored Anesthesia Care Procedure:                Pre-Anesthesia Assessment:                           - Prior to the procedure, a History and Physical                            was performed, and patient medications and                            allergies were reviewed. The patient's tolerance of                            previous anesthesia was also reviewed. The risks                            and benefits of the procedure and the sedation                            options and risks were discussed with the patient.                            All questions were answered, and informed consent                            was obtained. Prior Anticoagulants: The patient has                            taken no anticoagulant or antiplatelet agents. ASA                            Grade Assessment: III - A patient with severe                            systemic disease. After reviewing the risks and                            benefits, the patient was deemed in satisfactory                            condition to undergo the procedure.                           After obtaining informed consent, the endoscope was  passed under direct vision. Throughout the                            procedure, the patient's blood pressure, pulse, and                            oxygen saturations were monitored continuously. The                            GIF [id number] #[id number] was introduced through the                            mouth, and advanced to the second part of duodenum.                            The upper GI  endoscopy was accomplished without                            difficulty. The patient tolerated the procedure                            well. Scope In: Scope Out: Findings:                 LA Grade A (one or more mucosal breaks less than 5                            mm, not extending between tops of 2 mucosal folds)                            esophagitis with no bleeding was found 37 to 38 cm                            from the incisors.                           No endoscopic abnormality was evident in the                            esophagus to explain the patient's complaint of                            dysphagia. It was decided, however, to proceed with                            dilation of the entire esophagus. The scope was                            withdrawn. Dilation was performed with a Maloney                            dilator with mild resistance at 84 Fr.  A 1 cm hiatal hernia was present.                           The entire examined stomach was normal.                           The examined duodenum was normal. Complications:            No immediate complications. Estimated Blood Loss:     Estimated blood loss: none. Impression:               - LA Grade A reflux esophagitis with no bleeding.                           - No endoscopic esophageal abnormality to explain                            patient's dysphagia. Esophagus dilated with 54 Fr.                            Maloney.                           - 1 cm hiatal hernia.                           - Normal stomach.                           - Normal examined duodenum.                           - No specimens collected. Recommendation:           - Patient has a contact number available for                            emergencies. The signs and symptoms of potential                            delayed complications were discussed with the                            patient. Return to normal  activities tomorrow.                            Written discharge instructions were provided to the                            patient.                           - Resume previous diet.                           - Continue present medications. Resume pantoprazole  40 mg BID-AC. Sucralfate can be used in addition as                            needed for heartburn symptoms (short-term). Pt was                            mistaken and stopped PPI when sucralfate was                            recently added. Office follow-up if not improved. Jerene Bears, MD 01/23/2022 9:30:21 AM This report has been signed electronically.

## 2022-01-23 NOTE — Progress Notes (Signed)
See office note dated 01/16/2022 for details and current H&P  Patient presenting for upper endoscopy in the [facility] today with possible dilation

## 2022-01-23 NOTE — Progress Notes (Signed)
Called to room to assist during endoscopic procedure.  Patient ID and intended procedure confirmed with present staff. Received instructions for my participation in the procedure from the performing physician.  

## 2022-01-23 NOTE — Patient Instructions (Addendum)
-   Dilation diet for today.  Resume previous diet tomorrow.  - Continue present medications. Resume pantoprazole 40 mg twice a day before meals. Sucralfate can be used in addition as needed for heartburn symptoms (short-term). Pt was mistaken and stopped PPI when sucralfate was recently added. Office follow-up if not improved.  YOU HAD AN ENDOSCOPIC PROCEDURE TODAY AT [facility]:   Refer to the procedure report that was given to you for any specific questions about what was found during the examination.  If the procedure report does not answer your questions, please call your gastroenterologist to clarify.  If you requested that your care partner not be given the details of your procedure findings, then the procedure report has been included in a sealed envelope for you to review at your convenience later.  YOU SHOULD EXPECT: Some feelings of bloating in the abdomen. Passage of more gas than usual.  Walking can help get rid of the air that was put into your GI tract during the procedure and reduce the bloating. If you had a lower endoscopy (such as a colonoscopy or flexible sigmoidoscopy) you may notice spotting of blood in your stool or on the toilet paper. If you underwent a bowel prep for your procedure, you may not have a normal bowel movement for a few days.  Please Note:  You might notice some irritation and congestion in your nose or some drainage.  This is from the oxygen used during your procedure.  There is no need for concern and it should clear up in a day or so.  SYMPTOMS TO REPORT IMMEDIATELY:   Following upper endoscopy (EGD)  Vomiting of blood or coffee ground material  New chest pain or pain under the shoulder blades  Painful or persistently difficult swallowing  New shortness of breath  Fever of 100F or higher  Black, tarry-looking stools  For urgent or emergent issues, a gastroenterologist can be reached at any hour by calling [phone]. Do not use  MyChart messaging for urgent concerns.    DIET:  We do recommend a small meal at first, but then you may proceed to your regular diet.  Drink plenty of fluids but you should avoid alcoholic beverages for 24 hours.  ACTIVITY:  You should plan to take it easy for the rest of today and you should NOT DRIVE or use heavy machinery until tomorrow (because of the sedation medicines used during the test).    FOLLOW UP: Our staff will call the number listed on your records the next business day following your procedure.  We will call around 7:15- 8:00 am to check on you and address any questions or concerns that you may have regarding the information given to you following your procedure. If we do not reach you, we will leave a message.     If any biopsies were taken you will be contacted by phone or by letter within the next 1-3 weeks.  Please call us at [phone] if you have not heard about the biopsies in 3 weeks.    SIGNATURES/CONFIDENTIALITY: You and/or your care partner have signed paperwork which will be entered into your electronic medical record.  These signatures attest to the fact that that the information above on your After Visit Summary has been reviewed and is understood.  Full responsibility of the confidentiality of this discharge information lies with you and/or your care-partner.

## 2022-01-23 NOTE — Progress Notes (Signed)
Pt's states no medical or surgical changes since previsit or office visit. 

## 2022-01-23 NOTE — Progress Notes (Signed)
A and O x3. Report to RN. Tolerated MAC anesthesia well.Teeth unchanged after procedure. 

## 2022-01-24 ENCOUNTER — Telehealth: Payer: Self-pay | Admitting: *Deleted

## 2022-01-24 NOTE — Progress Notes (Signed)
Addendum: Reviewed and agree with assessment and management plan. Dominyck Reser M, MD  

## 2022-01-24 NOTE — Telephone Encounter (Signed)
  Follow up Call-     01/23/2022    8:18 AM 06/12/2021   11:03 AM  Call back number  Post procedure Call Back phone  # [phone]  Permission to leave phone message Yes Yes     Patient questions:  Do you have a fever, pain , or abdominal swelling? No. Pain Score  0 *  Have you tolerated food without any problems? Yes.    Have you been able to return to your normal activities? Yes.    Do you have any questions about your discharge instructions: Diet   No. Medications  No. Follow up visit  No.  Do you have questions or concerns about your Care? No.  Actions: * If pain score is 4 or above: No action needed, pain <4.

## 2022-02-08 ENCOUNTER — Other Ambulatory Visit: Payer: Self-pay | Admitting: Physician Assistant

## 2022-02-25 ENCOUNTER — Emergency Department
Admission: EM | Admit: 2022-02-25 | Discharge: 2022-02-25 | Disposition: A | Discharge status: Home / Self Care | Payer: 59 | Attending: Emergency Medicine | Admitting: Emergency Medicine

## 2022-02-25 ENCOUNTER — Other Ambulatory Visit: Payer: Self-pay

## 2022-02-25 ENCOUNTER — Emergency Department: Payer: 59

## 2022-02-25 DIAGNOSIS — B974 Respiratory syncytial virus as the cause of diseases classified elsewhere: Secondary | ICD-10-CM | POA: Insufficient documentation

## 2022-02-25 DIAGNOSIS — Z1152 Encounter for screening for COVID-19: Secondary | ICD-10-CM | POA: Diagnosis not present

## 2022-02-25 DIAGNOSIS — R509 Fever, unspecified: Secondary | ICD-10-CM | POA: Insufficient documentation

## 2022-02-25 DIAGNOSIS — J449 Chronic obstructive pulmonary disease, unspecified: Secondary | ICD-10-CM | POA: Insufficient documentation

## 2022-02-25 DIAGNOSIS — B338 Other specified viral diseases: Secondary | ICD-10-CM

## 2022-02-25 LAB — RESP PANEL BY RT-PCR (RSV, FLU A&B, COVID)  RVPGX2
Influenza A by PCR: NEGATIVE
Influenza B by PCR: NEGATIVE
Resp Syncytial Virus by PCR: POSITIVE — AB
SARS Coronavirus 2 by RT PCR: NEGATIVE

## 2022-02-25 MED ORDER — IPRATROPIUM-ALBUTEROL 0.5-2.5 (3) MG/3ML IN SOLN
3.0000 mL | Freq: Once | RESPIRATORY_TRACT | Status: AC
  Administered 2022-02-25: 3 mL via RESPIRATORY_TRACT
  Filled 2022-02-25: qty 3

## 2022-02-25 NOTE — Discharge Instructions (Signed)
Your swab was positive for RSV.  You do not have a pneumonia.  You were treated with a breathing treatment.  You may continue to use your inhalers at home in addition to Tylenol/ibuprofen as needed for body aches or fever.  Please return for any new, worsening, or change in symptoms or other concerns.  It was a pleasure caring for you today.
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
## 2022-04-08 ENCOUNTER — Other Ambulatory Visit: Payer: Self-pay | Admitting: Internal Medicine

## 2022-04-11 ENCOUNTER — Other Ambulatory Visit: Payer: Self-pay | Admitting: Physician Assistant

## 2022-04-17 ENCOUNTER — Encounter: Payer: Self-pay | Admitting: Nurse Practitioner

## 2022-04-17 ENCOUNTER — Ambulatory Visit (INDEPENDENT_AMBULATORY_CARE_PROVIDER_SITE_OTHER): Payer: 59 | Admitting: Nurse Practitioner

## 2022-04-17 VITALS — BP 140/82 | HR 92 | Temp 98.3°F | Resp 16 | Ht 60.0 in | Wt 167.0 lb

## 2022-04-17 DIAGNOSIS — I10 Essential (primary) hypertension: Secondary | ICD-10-CM | POA: Diagnosis not present

## 2022-04-17 DIAGNOSIS — Z1231 Encounter for screening mammogram for malignant neoplasm of breast: Secondary | ICD-10-CM

## 2022-04-17 DIAGNOSIS — K8020 Calculus of gallbladder without cholecystitis without obstruction: Secondary | ICD-10-CM

## 2022-04-17 DIAGNOSIS — M722 Plantar fascial fibromatosis: Secondary | ICD-10-CM

## 2022-04-17 DIAGNOSIS — G4709 Other insomnia: Secondary | ICD-10-CM

## 2022-04-17 DIAGNOSIS — E039 Hypothyroidism, unspecified: Secondary | ICD-10-CM

## 2022-04-17 DIAGNOSIS — M8580 Other specified disorders of bone density and structure, unspecified site: Secondary | ICD-10-CM

## 2022-04-17 DIAGNOSIS — G2581 Restless legs syndrome: Secondary | ICD-10-CM

## 2022-04-17 DIAGNOSIS — Z9889 Other specified postprocedural states: Secondary | ICD-10-CM

## 2022-04-17 DIAGNOSIS — E559 Vitamin D deficiency, unspecified: Secondary | ICD-10-CM

## 2022-04-17 DIAGNOSIS — K760 Fatty (change of) liver, not elsewhere classified: Secondary | ICD-10-CM

## 2022-04-17 DIAGNOSIS — Z9884 Bariatric surgery status: Secondary | ICD-10-CM

## 2022-04-17 DIAGNOSIS — G4733 Obstructive sleep apnea (adult) (pediatric): Secondary | ICD-10-CM

## 2022-04-17 DIAGNOSIS — G5642 Causalgia of left upper limb: Secondary | ICD-10-CM

## 2022-04-17 DIAGNOSIS — Z90710 Acquired absence of both cervix and uterus: Secondary | ICD-10-CM

## 2022-04-17 DIAGNOSIS — I272 Pulmonary hypertension, unspecified: Secondary | ICD-10-CM

## 2022-04-17 DIAGNOSIS — M150 Primary generalized (osteo)arthritis: Secondary | ICD-10-CM

## 2022-04-17 DIAGNOSIS — K581 Irritable bowel syndrome with constipation: Secondary | ICD-10-CM

## 2022-04-17 DIAGNOSIS — J449 Chronic obstructive pulmonary disease, unspecified: Secondary | ICD-10-CM

## 2022-04-17 DIAGNOSIS — M533 Sacrococcygeal disorders, not elsewhere classified: Secondary | ICD-10-CM

## 2022-04-17 DIAGNOSIS — Z91040 Latex allergy status: Secondary | ICD-10-CM

## 2022-04-17 DIAGNOSIS — E782 Mixed hyperlipidemia: Secondary | ICD-10-CM

## 2022-04-17 DIAGNOSIS — M961 Postlaminectomy syndrome, not elsewhere classified: Secondary | ICD-10-CM

## 2022-04-17 DIAGNOSIS — Z9181 History of falling: Secondary | ICD-10-CM

## 2022-04-17 MED ORDER — HYDROCHLOROTHIAZIDE 25 MG PO TABS: 25.0000 mg | ORAL_TABLET | Freq: Every day | ORAL | 3 refills | Status: DC

## 2022-04-17 MED ORDER — ESZOPICLONE 1 MG PO TABS: 1.0000 mg | ORAL_TABLET | Freq: Every evening | ORAL | 0 refills | Status: DC | PRN
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
## 2022-04-18 ENCOUNTER — Other Ambulatory Visit
Admission: RE | Admit: 2022-04-18 | Discharge: 2022-04-18 | Disposition: A | Discharge status: Home / Self Care | Payer: 59 | Source: Ambulatory Visit | Attending: Nurse Practitioner | Admitting: Nurse Practitioner

## 2022-04-18 DIAGNOSIS — K760 Fatty (change of) liver, not elsewhere classified: Secondary | ICD-10-CM | POA: Insufficient documentation

## 2022-04-18 DIAGNOSIS — E559 Vitamin D deficiency, unspecified: Secondary | ICD-10-CM | POA: Diagnosis not present

## 2022-04-18 DIAGNOSIS — E782 Mixed hyperlipidemia: Secondary | ICD-10-CM | POA: Diagnosis present

## 2022-04-18 DIAGNOSIS — E039 Hypothyroidism, unspecified: Secondary | ICD-10-CM | POA: Diagnosis not present

## 2022-04-18 LAB — CBC WITH DIFFERENTIAL/PLATELET
Abs Immature Granulocytes: 0.04 10*3/uL (ref 0.00–0.07)
Basophils Absolute: 0.1 10*3/uL (ref 0.0–0.1)
Basophils Relative: 0 %
Eosinophils Absolute: 0.2 10*3/uL (ref 0.0–0.5)
Eosinophils Relative: 2 %
HCT: 47.8 % — ABNORMAL HIGH (ref 36.0–46.0)
Hemoglobin: 16.1 g/dL — ABNORMAL HIGH (ref 12.0–15.0)
Immature Granulocytes: 0 %
Lymphocytes Relative: 15 %
Lymphs Abs: 2.1 10*3/uL (ref 0.7–4.0)
MCH: 29.3 pg (ref 26.0–34.0)
MCHC: 33.7 g/dL (ref 30.0–36.0)
MCV: 86.9 fL (ref 80.0–100.0)
Monocytes Absolute: 0.7 10*3/uL (ref 0.1–1.0)
Monocytes Relative: 5 %
Neutro Abs: 10.7 10*3/uL — ABNORMAL HIGH (ref 1.7–7.7)
Neutrophils Relative %: 78 %
Platelets: 250 10*3/uL (ref 150–400)
RBC: 5.5 MIL/uL — ABNORMAL HIGH (ref 3.87–5.11)
RDW: 14 % (ref 11.5–15.5)
WBC: 13.8 10*3/uL — ABNORMAL HIGH (ref 4.0–10.5)
nRBC: 0 % (ref 0.0–0.2)

## 2022-04-18 LAB — COMPREHENSIVE METABOLIC PANEL
ALT: 30 U/L (ref 0–44)
AST: 21 U/L (ref 15–41)
Albumin: 4.5 g/dL (ref 3.5–5.0)
Alkaline Phosphatase: 81 U/L (ref 38–126)
Anion gap: 11 (ref 5–15)
BUN: 39 mg/dL — ABNORMAL HIGH (ref 8–23)
CO2: 30 mmol/L (ref 22–32)
Calcium: 9.3 mg/dL (ref 8.9–10.3)
Chloride: 96 mmol/L — ABNORMAL LOW (ref 98–111)
Creatinine, Ser: 1.05 mg/dL — ABNORMAL HIGH (ref 0.44–1.00)
GFR, Estimated: 57 mL/min — ABNORMAL LOW (ref >60)
Glucose, Bld: 97 mg/dL (ref 70–99)
Potassium: 3.2 mmol/L — ABNORMAL LOW (ref 3.5–5.1)
Sodium: 137 mmol/L (ref 135–145)
Total Bilirubin: 1.1 mg/dL (ref 0.3–1.2)
Total Protein: 7.8 g/dL (ref 6.5–8.1)

## 2022-04-18 LAB — TSH: TSH: 8.535 u[IU]/mL — ABNORMAL HIGH (ref 0.350–4.500)

## 2022-04-18 LAB — LIPID PANEL
Cholesterol: 244 mg/dL — ABNORMAL HIGH (ref 0–200)
HDL: 92 mg/dL (ref >40)
LDL Cholesterol: 135 mg/dL — ABNORMAL HIGH (ref 0–99)
Total CHOL/HDL Ratio: 2.7 RATIO
Triglycerides: 87 mg/dL (ref <150)
VLDL: 17 mg/dL (ref 0–40)

## 2022-04-18 LAB — T4, FREE: Free T4: 1.01 ng/dL (ref 0.61–1.12)

## 2022-04-19 ENCOUNTER — Encounter: Payer: Self-pay | Admitting: Nurse Practitioner

## 2022-04-19 LAB — VITAMIN D 25 HYDROXY (VIT D DEFICIENCY, FRACTURES): Vit D, 25-Hydroxy: 65.52 ng/mL (ref 30–100)

## 2022-04-19 NOTE — Progress Notes (Signed)
We will discuss labs at upcoming visit

## 2022-04-24 ENCOUNTER — Ambulatory Visit
Admission: RE | Admit: 2022-04-24 | Discharge: 2022-04-24 | Disposition: A | Discharge status: Home / Self Care | Payer: 59 | Source: Ambulatory Visit | Attending: Nurse Practitioner | Admitting: Nurse Practitioner

## 2022-04-24 DIAGNOSIS — Z780 Asymptomatic menopausal state: Secondary | ICD-10-CM | POA: Insufficient documentation

## 2022-04-24 DIAGNOSIS — M810 Age-related osteoporosis without current pathological fracture: Secondary | ICD-10-CM | POA: Diagnosis not present

## 2022-04-24 DIAGNOSIS — E039 Hypothyroidism, unspecified: Secondary | ICD-10-CM | POA: Insufficient documentation

## 2022-04-24 DIAGNOSIS — Z13820 Encounter for screening for osteoporosis: Secondary | ICD-10-CM | POA: Diagnosis present

## 2022-04-24 DIAGNOSIS — J449 Chronic obstructive pulmonary disease, unspecified: Secondary | ICD-10-CM | POA: Diagnosis not present

## 2022-04-24 DIAGNOSIS — M8580 Other specified disorders of bone density and structure, unspecified site: Secondary | ICD-10-CM

## 2022-04-26 ENCOUNTER — Other Ambulatory Visit: Payer: Self-pay | Admitting: Orthopedic Surgery

## 2022-04-26 DIAGNOSIS — M5412 Radiculopathy, cervical region: Secondary | ICD-10-CM

## 2022-04-27 ENCOUNTER — Other Ambulatory Visit (HOSPITAL_COMMUNITY): Payer: Self-pay | Admitting: Orthopedic Surgery

## 2022-04-27 DIAGNOSIS — M5412 Radiculopathy, cervical region: Secondary | ICD-10-CM

## 2022-05-03 ENCOUNTER — Ambulatory Visit (INDEPENDENT_AMBULATORY_CARE_PROVIDER_SITE_OTHER): Payer: 59 | Admitting: Nurse Practitioner

## 2022-05-03 ENCOUNTER — Encounter: Payer: Self-pay | Admitting: Nurse Practitioner

## 2022-05-03 VITALS — BP 140/85 | HR 85 | Temp 97.8°F | Resp 16 | Ht 60.0 in | Wt 165.0 lb

## 2022-05-03 DIAGNOSIS — G4700 Insomnia, unspecified: Secondary | ICD-10-CM

## 2022-05-03 DIAGNOSIS — E039 Hypothyroidism, unspecified: Secondary | ICD-10-CM | POA: Diagnosis not present

## 2022-05-03 DIAGNOSIS — D751 Secondary polycythemia: Secondary | ICD-10-CM

## 2022-05-03 DIAGNOSIS — Z0001 Encounter for general adult medical examination with abnormal findings: Secondary | ICD-10-CM

## 2022-05-03 DIAGNOSIS — E876 Hypokalemia: Secondary | ICD-10-CM

## 2022-05-03 MED ORDER — LEVOTHYROXINE SODIUM 100 MCG PO TABS: 100.0000 ug | ORAL_TABLET | Freq: Every day | ORAL | 2 refills | Status: DC

## 2022-05-03 MED ORDER — ESZOPICLONE 3 MG PO TABS: 3.0000 mg | ORAL_TABLET | Freq: Every day | ORAL | 2 refills | Status: DC
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
## 2022-05-05 ENCOUNTER — Encounter: Payer: Self-pay | Admitting: Nurse Practitioner

## 2022-06-15 ENCOUNTER — Encounter (HOSPITAL_COMMUNITY): Payer: Self-pay

## 2022-06-15 NOTE — Progress Notes (Signed)
SDW call  Patient was given pre-op instructions over the phone. Patient verbalized understanding of instructions provided.     PCP - Dr. Jeannie Fend Cardiologist - Dr. Randell Loop, [place] Pulmonary: Denies   PPM/ICD - Denies   Chest x-ray - 02/25/2022  EKG -  02/25/2022 Stress Test - ECHO - 03/08/2021 Cardiac Cath -   Sleep Study/sleep apnea/CPAP: Diagnosed with sleep apnea, does not wear her CPAP  Non-diabetic Patient taking Jardiance for her heart. Instructed to stop 72 hours prior to procedure. Last dose 06/15/2022  Blood Thinner Instructions: Denies  Aspirin Instructions:Denies   ERAS Protcol - Yes, clear fluids until 0700 PRE-SURGERY Ensure or G2-    COVID TEST- n/a   Anesthesia review: Yes. HTN, sleep apnea, asthma, COPD, obesity, heart murmur, heart cath   Patient denies shortness of breath, fever, cough and chest pain over the phone call  Your procedure is scheduled on Tuesday June 19, 2022  Report to [facility] Main Entrance "A" at  0730  A.M., then check in with the Admitting office.  Call this number if you have problems the morning of surgery:  [phone]   If you have any questions prior to your surgery date call [phone]: Open Monday-Friday 8am-4pm If you experience any cold or flu symptoms such as cough, fever, chills, shortness of breath, etc. between now and your scheduled surgery, please notify us at the above number     Remember:  Do not eat after midnight the night before your surgery  You may drink clear liquids until 0700 the morning of your surgery.   Clear liquids allowed are: Water, Non-Citrus Juices (without pulp), Carbonated Beverages, Clear Tea, Black Coffee ONLY (NO MILK, CREAM OR POWDERED CREAMER of any kind), and Gatorade   Take these medicines the morning of surgery with A SIP OF WATER:  Levothyroxine, linzess, protonix  As needed: Albuterol, symbicort  As of today, STOP taking any Aspirin (unless otherwise instructed by  your surgeon) Aleve, Naproxen, Ibuprofen, Motrin, Advil, Goody's, BC's, all herbal medications, fish oil, and all vitamins.

## 2022-06-18 NOTE — Anesthesia Preprocedure Evaluation (Signed)
Anesthesia Evaluation  Patient identified by MRN, date of birth, ID band Patient awake    Reviewed: Allergy & Precautions, H&P , NPO status , Patient's Chart, lab work & pertinent test results  Airway Mallampati: II  TM Distance: >3 FB Neck ROM: Full    Dental no notable dental hx.    Pulmonary shortness of breath, asthma , sleep apnea , COPD   Pulmonary exam normal breath sounds clear to auscultation       Cardiovascular hypertension, Pt. on medications negative cardio ROS Normal cardiovascular exam Rhythm:Regular Rate:Normal     Neuro/Psych  Headaches  Anxiety Depression     negative psych ROS   GI/Hepatic Neg liver ROS,GERD  ,,  Endo/Other  Hypothyroidism    Renal/GU negative Renal ROS  negative genitourinary   Musculoskeletal  (+) Arthritis , Osteoarthritis,  Fibromyalgia -  Abdominal   Peds negative pediatric ROS (+)  Hematology negative hematology ROS (+)   Anesthesia Other Findings   Reproductive/Obstetrics negative OB ROS                             Anesthesia Physical Anesthesia Plan  ASA: 3  Anesthesia Plan: General   Post-op Pain Management: Minimal or no pain anticipated   Induction: Intravenous  PONV Risk Score and Plan: 3 and Ondansetron, Dexamethasone, Midazolam and Treatment may vary due to age or medical condition  Airway Management Planned: LMA and Oral ETT  Additional Equipment:   Intra-op Plan:   Post-operative Plan: Extubation in OR  Informed Consent: I have reviewed the patients History and Physical, chart, labs and discussed the procedure including the risks, benefits and alternatives for the proposed anesthesia with the patient or authorized representative who has indicated his/her understanding and acceptance.     Dental advisory given  Plan Discussed with: CRNA  Anesthesia Plan Comments: (PAT note written 06/18/2022 by Shonna Chock,  PA-C.  )       Anesthesia Quick Evaluation
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
## 2022-06-19 ENCOUNTER — Encounter (HOSPITAL_COMMUNITY): Payer: Self-pay | Admitting: Orthopedic Surgery

## 2022-06-19 ENCOUNTER — Ambulatory Visit (HOSPITAL_COMMUNITY): Payer: 59 | Admitting: Vascular Surgery

## 2022-06-19 ENCOUNTER — Encounter (HOSPITAL_COMMUNITY)
Admission: RE | Disposition: A | Discharge status: Home / Self Care | Payer: Self-pay | Source: Home / Self Care | Attending: Orthopedic Surgery

## 2022-06-19 ENCOUNTER — Other Ambulatory Visit: Payer: Self-pay

## 2022-06-19 ENCOUNTER — Ambulatory Visit (HOSPITAL_BASED_OUTPATIENT_CLINIC_OR_DEPARTMENT_OTHER): Payer: 59 | Admitting: Vascular Surgery

## 2022-06-19 ENCOUNTER — Ambulatory Visit (HOSPITAL_COMMUNITY)
Admission: RE | Admit: 2022-06-19 | Discharge: 2022-06-19 | Disposition: A | Discharge status: Home / Self Care | Payer: 59 | Source: Ambulatory Visit | Attending: Orthopedic Surgery | Admitting: Orthopedic Surgery

## 2022-06-19 ENCOUNTER — Ambulatory Visit (HOSPITAL_COMMUNITY)
Admission: RE | Admit: 2022-06-19 | Discharge: 2022-06-19 | Disposition: A | Discharge status: Home / Self Care | Payer: 59 | Attending: Orthopedic Surgery | Admitting: Orthopedic Surgery

## 2022-06-19 DIAGNOSIS — G4733 Obstructive sleep apnea (adult) (pediatric): Secondary | ICD-10-CM | POA: Insufficient documentation

## 2022-06-19 DIAGNOSIS — Z79899 Other long term (current) drug therapy: Secondary | ICD-10-CM | POA: Insufficient documentation

## 2022-06-19 DIAGNOSIS — F419 Anxiety disorder, unspecified: Secondary | ICD-10-CM | POA: Diagnosis not present

## 2022-06-19 DIAGNOSIS — M797 Fibromyalgia: Secondary | ICD-10-CM | POA: Diagnosis not present

## 2022-06-19 DIAGNOSIS — M5412 Radiculopathy, cervical region: Secondary | ICD-10-CM

## 2022-06-19 DIAGNOSIS — K219 Gastro-esophageal reflux disease without esophagitis: Secondary | ICD-10-CM | POA: Insufficient documentation

## 2022-06-19 DIAGNOSIS — F32A Depression, unspecified: Secondary | ICD-10-CM | POA: Insufficient documentation

## 2022-06-19 DIAGNOSIS — J449 Chronic obstructive pulmonary disease, unspecified: Secondary | ICD-10-CM | POA: Insufficient documentation

## 2022-06-19 DIAGNOSIS — M4802 Spinal stenosis, cervical region: Secondary | ICD-10-CM | POA: Insufficient documentation

## 2022-06-19 DIAGNOSIS — K760 Fatty (change of) liver, not elsewhere classified: Secondary | ICD-10-CM | POA: Insufficient documentation

## 2022-06-19 DIAGNOSIS — I10 Essential (primary) hypertension: Secondary | ICD-10-CM | POA: Diagnosis not present

## 2022-06-19 DIAGNOSIS — M1990 Unspecified osteoarthritis, unspecified site: Secondary | ICD-10-CM | POA: Insufficient documentation

## 2022-06-19 DIAGNOSIS — Z981 Arthrodesis status: Secondary | ICD-10-CM | POA: Diagnosis not present

## 2022-06-19 DIAGNOSIS — G4730 Sleep apnea, unspecified: Secondary | ICD-10-CM | POA: Diagnosis not present

## 2022-06-19 DIAGNOSIS — E785 Hyperlipidemia, unspecified: Secondary | ICD-10-CM | POA: Diagnosis not present

## 2022-06-19 DIAGNOSIS — Z7951 Long term (current) use of inhaled steroids: Secondary | ICD-10-CM | POA: Insufficient documentation

## 2022-06-19 DIAGNOSIS — E039 Hypothyroidism, unspecified: Secondary | ICD-10-CM | POA: Insufficient documentation

## 2022-06-19 HISTORY — PX: RADIOLOGY WITH ANESTHESIA: SHX6223

## 2022-06-19 LAB — BASIC METABOLIC PANEL
Anion gap: 11 (ref 5–15)
BUN: 18 mg/dL (ref 8–23)
CO2: 28 mmol/L (ref 22–32)
Calcium: 9.4 mg/dL (ref 8.9–10.3)
Chloride: 99 mmol/L (ref 98–111)
Creatinine, Ser: 0.8 mg/dL (ref 0.44–1.00)
GFR, Estimated: >60 mL/min (ref >60)
Glucose, Bld: 104 mg/dL — ABNORMAL HIGH (ref 70–99)
Potassium: 2.7 mmol/L — CL (ref 3.5–5.1)
Sodium: 138 mmol/L (ref 135–145)

## 2022-06-19 SURGERY — MRI WITH ANESTHESIA
Anesthesia: General

## 2022-06-19 MED ORDER — PROPOFOL 10 MG/ML IV BOLUS: INTRAVENOUS | Status: DC | PRN

## 2022-06-19 MED ORDER — AMISULPRIDE (ANTIEMETIC) 5 MG/2ML IV SOLN: 10.0000 mg | Freq: Once | INTRAVENOUS | Status: DC | PRN

## 2022-06-19 MED ORDER — PHENYLEPHRINE HCL-NACL 20-0.9 MG/250ML-% IV SOLN
INTRAVENOUS | Status: DC | PRN
  Administered 2022-06-19: 40 ug/min via INTRAVENOUS

## 2022-06-19 MED ORDER — ONDANSETRON HCL 4 MG/2ML IJ SOLN
INTRAMUSCULAR | Status: DC | PRN
  Administered 2022-06-19: 4 mg via INTRAVENOUS

## 2022-06-19 MED ORDER — PROPOFOL 10 MG/ML IV BOLUS
INTRAVENOUS | Status: DC | PRN
  Administered 2022-06-19: 130 mg via INTRAVENOUS

## 2022-06-19 MED ORDER — DEXAMETHASONE SODIUM PHOSPHATE 10 MG/ML IJ SOLN
INTRAMUSCULAR | Status: DC | PRN
  Administered 2022-06-19: 10 mg via INTRAVENOUS

## 2022-06-19 MED ORDER — LACTATED RINGERS IV SOLN: INTRAVENOUS | Status: DC

## 2022-06-19 MED ORDER — PROMETHAZINE HCL 25 MG/ML IJ SOLN: 6.2500 mg | INTRAMUSCULAR | Status: DC | PRN

## 2022-06-19 MED ORDER — FENTANYL CITRATE (PF) 250 MCG/5ML IJ SOLN
INTRAMUSCULAR | Status: DC | PRN
  Administered 2022-06-19: 100 ug via INTRAVENOUS

## 2022-06-19 MED ORDER — ROCURONIUM BROMIDE 10 MG/ML (PF) SYRINGE
PREFILLED_SYRINGE | INTRAVENOUS | Status: DC | PRN
  Administered 2022-06-19: 60 mg via INTRAVENOUS

## 2022-06-19 MED ORDER — HYDROMORPHONE HCL 1 MG/ML IJ SOLN: 0.2500 mg | INTRAMUSCULAR | Status: DC | PRN

## 2022-06-19 MED ORDER — ORAL CARE MOUTH RINSE: 15.0000 mL | Freq: Once | OROMUCOSAL | Status: AC

## 2022-06-19 MED ORDER — SUGAMMADEX SODIUM 200 MG/2ML IV SOLN
INTRAVENOUS | Status: DC | PRN
  Administered 2022-06-19: 200 mg via INTRAVENOUS

## 2022-06-19 MED ORDER — LIDOCAINE 2% (20 MG/ML) 5 ML SYRINGE
INTRAMUSCULAR | Status: DC | PRN
  Administered 2022-06-19: 60 mg via INTRAVENOUS

## 2022-06-19 MED ORDER — MIDAZOLAM HCL 2 MG/2ML IJ SOLN
INTRAMUSCULAR | Status: DC | PRN
  Administered 2022-06-19 (×2): 1 mg via INTRAVENOUS

## 2022-06-19 MED ORDER — CHLORHEXIDINE GLUCONATE 0.12 % MT SOLN
15.0000 mL | Freq: Once | OROMUCOSAL | Status: AC
  Administered 2022-06-19: 15 mL via OROMUCOSAL
  Filled 2022-06-19: qty 15

## 2022-06-19 NOTE — Progress Notes (Signed)
Notified Dr. Hyacinth Meeker of pt's critical K+ -2.7. No orders at this time.

## 2022-06-19 NOTE — Transfer of Care (Signed)
Immediate Anesthesia Transfer of Care Note  Patient: [name]  Procedure(s) Performed: MRI WITH ANESTHESIA OF CERVICAL SPINE WITHOUT CONTRAST  Patient Location: PACU  Anesthesia Type:General  Level of Consciousness: awake, alert , and oriented  Airway & Oxygen Therapy: Patient Spontanous Breathing and Patient connected to face mask oxygen  Post-op Assessment: Report given to RN and Post -op Vital signs reviewed and stable  Post vital signs: Reviewed and stable  Last Vitals:  Vitals Value Taken Time  BP 129/70   Temp    Pulse 74 06/19/22 1307  Resp 15 06/19/22 1307  SpO2 97 % 06/19/22 1307  Vitals shown include unvalidated device data.  Last Pain:  Vitals:   06/19/22 0924  TempSrc:   PainSc: 4          Complications: No notable events documented.

## 2022-06-19 NOTE — Anesthesia Procedure Notes (Signed)
Procedure Name: Intubation Date/Time: 06/19/2022 12:15 PM  Performed by: Randon Goldsmith, CRNAPre-anesthesia Checklist: Patient identified, Emergency Drugs available, Suction available and Patient being monitored Patient Re-evaluated:Patient Re-evaluated prior to induction Oxygen Delivery Method: Circle system utilized Preoxygenation: Pre-oxygenation with 100% oxygen Induction Type: IV induction Ventilation: Mask ventilation without difficulty Laryngoscope Size: 3 and Glidescope Grade View: Grade I Tube type: Oral Tube size: 7.0 mm Number of attempts: 1 Airway Equipment and Method: Stylet and Oral airway Placement Confirmation: ETT inserted through vocal cords under direct vision, positive ETCO2 and breath sounds checked- equal and bilateral Secured at: 22 cm Tube secured with: Tape Dental Injury: Teeth and Oropharynx as per pre-operative assessment

## 2022-06-20 ENCOUNTER — Encounter (HOSPITAL_COMMUNITY): Payer: Self-pay | Admitting: Radiology

## 2022-06-20 NOTE — Anesthesia Postprocedure Evaluation (Signed)
Anesthesia Post Note  Patient: [name]  Procedure(s) Performed: MRI WITH ANESTHESIA OF CERVICAL SPINE WITHOUT CONTRAST     Patient location during evaluation: PACU Anesthesia Type: General Level of consciousness: awake and alert Pain management: pain level controlled Vital Signs Assessment: post-procedure vital signs reviewed and stable Respiratory status: spontaneous breathing, nonlabored ventilation and respiratory function stable Cardiovascular status: blood pressure returned to baseline and stable Postop Assessment: no apparent nausea or vomiting Anesthetic complications: no   No notable events documented.  Last Vitals:  Vitals:   06/19/22 1315 06/19/22 1330  BP: 119/78 120/70  Pulse: 74 67  Resp: 15 17  Temp:  36.7 C  SpO2: 97% 95%    Last Pain:  Vitals:   06/19/22 1330  TempSrc:   PainSc: 2                  Lowella Curb

## 2022-06-21 ENCOUNTER — Other Ambulatory Visit
Admission: RE | Admit: 2022-06-21 | Discharge: 2022-06-21 | Disposition: A | Discharge status: Home / Self Care | Payer: 59 | Attending: Nurse Practitioner | Admitting: Nurse Practitioner

## 2022-06-21 ENCOUNTER — Encounter: Payer: Self-pay | Admitting: Nurse Practitioner

## 2022-06-21 DIAGNOSIS — Z0001 Encounter for general adult medical examination with abnormal findings: Secondary | ICD-10-CM | POA: Diagnosis present

## 2022-06-21 DIAGNOSIS — E039 Hypothyroidism, unspecified: Secondary | ICD-10-CM | POA: Insufficient documentation

## 2022-06-21 DIAGNOSIS — D751 Secondary polycythemia: Secondary | ICD-10-CM | POA: Insufficient documentation

## 2022-06-21 DIAGNOSIS — E876 Hypokalemia: Secondary | ICD-10-CM | POA: Diagnosis not present

## 2022-06-21 LAB — CBC WITH DIFFERENTIAL/PLATELET
Abs Immature Granulocytes: 0.06 10*3/uL (ref 0.00–0.07)
Basophils Absolute: 0.1 10*3/uL (ref 0.0–0.1)
Basophils Relative: 1 %
Eosinophils Absolute: 0.2 10*3/uL (ref 0.0–0.5)
Eosinophils Relative: 2 %
HCT: 40 % (ref 36.0–46.0)
Hemoglobin: 14.2 g/dL (ref 12.0–15.0)
Immature Granulocytes: 1 %
Lymphocytes Relative: 22 %
Lymphs Abs: 2.2 10*3/uL (ref 0.7–4.0)
MCH: 31 pg (ref 26.0–34.0)
MCHC: 35.5 g/dL (ref 30.0–36.0)
MCV: 87.3 fL (ref 80.0–100.0)
Monocytes Absolute: 0.4 10*3/uL (ref 0.1–1.0)
Monocytes Relative: 4 %
Neutro Abs: 7 10*3/uL (ref 1.7–7.7)
Neutrophils Relative %: 70 %
Platelets: 230 10*3/uL (ref 150–400)
RBC: 4.58 MIL/uL (ref 3.87–5.11)
RDW: 13.4 % (ref 11.5–15.5)
WBC: 9.9 10*3/uL (ref 4.0–10.5)
nRBC: 0 % (ref 0.0–0.2)

## 2022-06-21 LAB — COMPREHENSIVE METABOLIC PANEL
ALT: 18 U/L (ref 0–44)
AST: 18 U/L (ref 15–41)
Albumin: 3.6 g/dL (ref 3.5–5.0)
Alkaline Phosphatase: 65 U/L (ref 38–126)
Anion gap: 10 (ref 5–15)
BUN: 23 mg/dL (ref 8–23)
CO2: 30 mmol/L (ref 22–32)
Calcium: 8.9 mg/dL (ref 8.9–10.3)
Chloride: 100 mmol/L (ref 98–111)
Creatinine, Ser: 0.86 mg/dL (ref 0.44–1.00)
GFR, Estimated: >60 mL/min (ref >60)
Glucose, Bld: 94 mg/dL (ref 70–99)
Potassium: 3 mmol/L — ABNORMAL LOW (ref 3.5–5.1)
Sodium: 140 mmol/L (ref 135–145)
Total Bilirubin: 0.9 mg/dL (ref 0.3–1.2)
Total Protein: 6.5 g/dL (ref 6.5–8.1)

## 2022-06-21 LAB — T4, FREE: Free T4: 1.06 ng/dL (ref 0.61–1.12)

## 2022-06-21 LAB — TSH: TSH: 0.806 u[IU]/mL (ref 0.350–4.500)

## 2022-06-22 ENCOUNTER — Ambulatory Visit (INDEPENDENT_AMBULATORY_CARE_PROVIDER_SITE_OTHER): Payer: 59 | Admitting: Physician Assistant

## 2022-06-22 ENCOUNTER — Encounter: Payer: Self-pay | Admitting: Physician Assistant

## 2022-06-22 VITALS — BP 138/78 | HR 60 | Temp 97.0°F | Resp 16 | Ht 60.0 in | Wt 166.4 lb

## 2022-06-22 DIAGNOSIS — I10 Essential (primary) hypertension: Secondary | ICD-10-CM | POA: Diagnosis not present

## 2022-06-22 DIAGNOSIS — E876 Hypokalemia: Secondary | ICD-10-CM

## 2022-06-22 MED ORDER — FLUCONAZOLE 150 MG PO TABS: 150.0000 mg | ORAL_TABLET | Freq: Once | ORAL | 0 refills | Status: AC

## 2022-06-22 MED ORDER — SPIRONOLACTONE 25 MG PO TABS
25.0000 mg | ORAL_TABLET | Freq: Every day | ORAL | 3 refills | Status: DC
Start: 2022-06-22 — End: 2022-09-17

## 2022-06-22 NOTE — Telephone Encounter (Signed)
Spoke with pt made her appt
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
## 2022-06-28 ENCOUNTER — Other Ambulatory Visit: Payer: Self-pay | Admitting: Internal Medicine

## 2022-06-29 ENCOUNTER — Other Ambulatory Visit
Admission: RE | Admit: 2022-06-29 | Discharge: 2022-06-29 | Disposition: A | Discharge status: Home / Self Care | Payer: 59 | Attending: Physician Assistant | Admitting: Physician Assistant

## 2022-06-29 ENCOUNTER — Other Ambulatory Visit: Payer: Self-pay | Admitting: Physician Assistant

## 2022-06-29 ENCOUNTER — Telehealth: Payer: Self-pay

## 2022-06-29 DIAGNOSIS — E876 Hypokalemia: Secondary | ICD-10-CM

## 2022-06-29 LAB — BASIC METABOLIC PANEL
Anion gap: 10 (ref 5–15)
BUN: 23 mg/dL (ref 8–23)
CO2: 25 mmol/L (ref 22–32)
Calcium: 8.3 mg/dL — ABNORMAL LOW (ref 8.9–10.3)
Chloride: 104 mmol/L (ref 98–111)
Creatinine, Ser: 0.82 mg/dL (ref 0.44–1.00)
GFR, Estimated: >60 mL/min (ref >60)
Glucose, Bld: 108 mg/dL — ABNORMAL HIGH (ref 70–99)
Potassium: 3.2 mmol/L — ABNORMAL LOW (ref 3.5–5.1)
Sodium: 139 mmol/L (ref 135–145)

## 2022-06-29 NOTE — Telephone Encounter (Signed)
Spoke with patient regarding lab results. 

## 2022-06-29 NOTE — Telephone Encounter (Signed)
-----   Message from Carlean Jews, PA-C sent at 06/29/2022  1:22 PM EDT ----- Please let her know that her potassium is still low, but improving and is up to 3.2 now. She can take an extra potassium tab MWF and then will recheck levels as she continues to take spironolactone instead of the HCTZ. Her calcium was also a little low and should supplement. I have already placed order to the lab for repeat testing once she increases again. Can go in 1-2 weeks for this.

## 2022-07-01 ENCOUNTER — Encounter: Payer: Self-pay | Admitting: Internal Medicine

## 2022-07-02 MED ORDER — PANTOPRAZOLE SODIUM 40 MG PO TBEC: 40.0000 mg | DELAYED_RELEASE_TABLET | Freq: Two times a day (BID) | ORAL | 1 refills | Status: DC

## 2022-07-11 ENCOUNTER — Telehealth: Payer: Self-pay | Admitting: Nurse Practitioner

## 2022-07-11 ENCOUNTER — Other Ambulatory Visit
Admission: RE | Admit: 2022-07-11 | Discharge: 2022-07-11 | Disposition: A | Discharge status: Home / Self Care | Payer: 59 | Source: Ambulatory Visit | Attending: Physician Assistant | Admitting: Physician Assistant

## 2022-07-11 DIAGNOSIS — E876 Hypokalemia: Secondary | ICD-10-CM | POA: Insufficient documentation

## 2022-07-11 LAB — BASIC METABOLIC PANEL
Anion gap: 11 (ref 5–15)
BUN: 20 mg/dL (ref 8–23)
CO2: 24 mmol/L (ref 22–32)
Calcium: 9 mg/dL (ref 8.9–10.3)
Chloride: 104 mmol/L (ref 98–111)
Creatinine, Ser: 0.89 mg/dL (ref 0.44–1.00)
GFR, Estimated: >60 mL/min (ref >60)
Glucose, Bld: 159 mg/dL — ABNORMAL HIGH (ref 70–99)
Potassium: 3.5 mmol/L (ref 3.5–5.1)
Sodium: 139 mmol/L (ref 135–145)

## 2022-07-12 ENCOUNTER — Telehealth: Payer: Self-pay

## 2022-07-12 NOTE — Telephone Encounter (Signed)
Sent patient a MyChart message

## 2022-07-12 NOTE — Telephone Encounter (Signed)
-----   Message from Carlean Jews, PA-C sent at 07/11/2022  3:13 PM EDT ----- Please let her know that her potassium is now in range, still on the low end of normal and continue current supplement. Follow up with Alyssa as planned

## 2022-07-26 NOTE — Telephone Encounter (Signed)
Error

## 2022-07-28 ENCOUNTER — Other Ambulatory Visit: Payer: Self-pay | Admitting: Nurse Practitioner

## 2022-07-28 DIAGNOSIS — E039 Hypothyroidism, unspecified: Secondary | ICD-10-CM

## 2022-08-03 ENCOUNTER — Ambulatory Visit: Payer: 59 | Admitting: Nurse Practitioner

## 2022-08-03 ENCOUNTER — Other Ambulatory Visit: Payer: Self-pay | Admitting: Nurse Practitioner

## 2022-08-03 DIAGNOSIS — G4700 Insomnia, unspecified: Secondary | ICD-10-CM

## 2022-08-06 NOTE — Telephone Encounter (Signed)
Please send

## 2022-08-08 ENCOUNTER — Encounter: Payer: Self-pay | Admitting: Nurse Practitioner

## 2022-08-08 ENCOUNTER — Ambulatory Visit (INDEPENDENT_AMBULATORY_CARE_PROVIDER_SITE_OTHER): Payer: 59 | Admitting: Nurse Practitioner

## 2022-08-08 VITALS — BP 134/88 | HR 85 | Temp 97.5°F | Resp 16 | Ht 60.0 in | Wt 165.0 lb

## 2022-08-08 DIAGNOSIS — G4700 Insomnia, unspecified: Secondary | ICD-10-CM

## 2022-08-08 DIAGNOSIS — S39012A Strain of muscle, fascia and tendon of lower back, initial encounter: Secondary | ICD-10-CM | POA: Diagnosis not present

## 2022-08-08 DIAGNOSIS — E876 Hypokalemia: Secondary | ICD-10-CM | POA: Diagnosis not present

## 2022-08-08 MED ORDER — METHOCARBAMOL 750 MG PO TABS
750.0000 mg | ORAL_TABLET | Freq: Four times a day (QID) | ORAL | 1 refills | Status: DC | PRN
Start: 2022-08-08 — End: 2022-08-31

## 2022-08-08 MED ORDER — ESZOPICLONE 3 MG PO TABS
3.0000 mg | ORAL_TABLET | Freq: Every day | ORAL | 2 refills | Status: DC
Start: 2022-08-08 — End: 2022-10-24
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
## 2022-08-15 ENCOUNTER — Telehealth: Payer: Self-pay

## 2022-08-15 ENCOUNTER — Other Ambulatory Visit
Admission: RE | Admit: 2022-08-15 | Discharge: 2022-08-15 | Disposition: A | Discharge status: Home / Self Care | Payer: 59 | Source: Ambulatory Visit | Attending: Nurse Practitioner | Admitting: Nurse Practitioner

## 2022-08-15 DIAGNOSIS — E876 Hypokalemia: Secondary | ICD-10-CM | POA: Insufficient documentation

## 2022-08-15 LAB — POTASSIUM: Potassium: 3.8 mmol/L (ref 3.5–5.1)

## 2022-08-15 NOTE — Telephone Encounter (Signed)
-----   Message from Sallyanne Kuster, NP sent at 08/15/2022 12:48 PM EDT ----- Potassium level is normal and continuing to improve.

## 2022-08-15 NOTE — Telephone Encounter (Signed)
Patient notified

## 2022-08-15 NOTE — Progress Notes (Signed)
Potassium level is normal and continuing to improve.

## 2022-08-16 ENCOUNTER — Encounter: Payer: Self-pay | Admitting: Nurse Practitioner

## 2022-08-16 DIAGNOSIS — R109 Unspecified abdominal pain: Secondary | ICD-10-CM

## 2022-08-16 DIAGNOSIS — R1032 Left lower quadrant pain: Secondary | ICD-10-CM

## 2022-08-16 DIAGNOSIS — S39012A Strain of muscle, fascia and tendon of lower back, initial encounter: Secondary | ICD-10-CM

## 2022-08-17 MED ORDER — CELECOXIB 100 MG PO CAPS
100.0000 mg | ORAL_CAPSULE | Freq: Two times a day (BID) | ORAL | 2 refills | Status: DC
Start: 2022-08-17 — End: 2022-11-12

## 2022-08-24 ENCOUNTER — Ambulatory Visit
Admission: RE | Admit: 2022-08-24 | Discharge: 2022-08-24 | Disposition: A | Discharge status: Home / Self Care | Payer: 59 | Source: Ambulatory Visit | Attending: Nurse Practitioner | Admitting: Nurse Practitioner

## 2022-08-24 DIAGNOSIS — R1032 Left lower quadrant pain: Secondary | ICD-10-CM | POA: Diagnosis present

## 2022-08-24 DIAGNOSIS — R109 Unspecified abdominal pain: Secondary | ICD-10-CM | POA: Diagnosis present

## 2022-08-26 ENCOUNTER — Other Ambulatory Visit: Payer: Self-pay | Admitting: Internal Medicine

## 2022-08-26 ENCOUNTER — Encounter: Payer: Self-pay | Admitting: Nurse Practitioner

## 2022-08-28 ENCOUNTER — Ambulatory Visit: Payer: 59

## 2022-08-28 ENCOUNTER — Encounter: Payer: Self-pay | Admitting: Nurse Practitioner

## 2022-08-31 ENCOUNTER — Ambulatory Visit
Admission: RE | Admit: 2022-08-31 | Discharge: 2022-08-31 | Disposition: A | Discharge status: Home / Self Care | Payer: 59 | Source: Ambulatory Visit | Attending: Nurse Practitioner | Admitting: Nurse Practitioner

## 2022-08-31 ENCOUNTER — Ambulatory Visit: Payer: 59 | Admitting: Nurse Practitioner

## 2022-08-31 ENCOUNTER — Encounter: Payer: Self-pay | Admitting: Nurse Practitioner

## 2022-08-31 ENCOUNTER — Telehealth: Payer: Self-pay | Admitting: Nurse Practitioner

## 2022-08-31 VITALS — BP 135/78 | HR 100 | Temp 98.2°F | Resp 16 | Ht 60.0 in | Wt 164.2 lb

## 2022-08-31 DIAGNOSIS — G8929 Other chronic pain: Secondary | ICD-10-CM | POA: Insufficient documentation

## 2022-08-31 DIAGNOSIS — M5442 Lumbago with sciatica, left side: Secondary | ICD-10-CM

## 2022-08-31 DIAGNOSIS — M25552 Pain in left hip: Secondary | ICD-10-CM | POA: Insufficient documentation

## 2022-08-31 DIAGNOSIS — I10 Essential (primary) hypertension: Secondary | ICD-10-CM

## 2022-08-31 MED ORDER — METAXALONE 800 MG PO TABS
800.0000 mg | ORAL_TABLET | Freq: Three times a day (TID) | ORAL | 2 refills | Status: DC
Start: 2022-08-31 — End: 2023-01-16
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
## 2022-09-01 ENCOUNTER — Encounter: Payer: Self-pay | Admitting: Nurse Practitioner

## 2022-09-01 MED ORDER — CYCLOBENZAPRINE HCL 5 MG PO TABS
2.5000 mg | ORAL_TABLET | Freq: Every evening | ORAL | 1 refills | Status: DC | PRN
Start: 2022-09-01 — End: 2022-09-06

## 2022-09-03 ENCOUNTER — Encounter: Payer: Self-pay | Admitting: Nurse Practitioner

## 2022-09-03 NOTE — Telephone Encounter (Signed)
Error

## 2022-09-05 ENCOUNTER — Ambulatory Visit: Payer: 59 | Admitting: Nurse Practitioner

## 2022-09-05 ENCOUNTER — Telehealth: Payer: Self-pay | Admitting: Nurse Practitioner

## 2022-09-06 ENCOUNTER — Encounter: Payer: Self-pay | Admitting: Nurse Practitioner

## 2022-09-06 MED ORDER — CYCLOBENZAPRINE HCL 10 MG PO TABS: 10.0000 mg | ORAL_TABLET | Freq: Three times a day (TID) | ORAL | 2 refills | Status: DC | PRN

## 2022-09-07 ENCOUNTER — Encounter: Payer: Self-pay | Admitting: Nurse Practitioner

## 2022-09-07 NOTE — Telephone Encounter (Signed)
Pt.notified

## 2022-09-09 ENCOUNTER — Encounter: Payer: Self-pay | Admitting: Nurse Practitioner

## 2022-09-17 ENCOUNTER — Other Ambulatory Visit: Payer: Self-pay | Admitting: Physician Assistant

## 2022-09-17 DIAGNOSIS — I10 Essential (primary) hypertension: Secondary | ICD-10-CM

## 2022-10-17 ENCOUNTER — Encounter: Payer: Self-pay | Admitting: Nurse Practitioner

## 2022-10-18 MED ORDER — POTASSIUM CHLORIDE CRYS ER 10 MEQ PO TBCR: 10.0000 meq | EXTENDED_RELEASE_TABLET | Freq: Two times a day (BID) | ORAL | 2 refills | Status: DC

## 2022-10-24 ENCOUNTER — Ambulatory Visit (INDEPENDENT_AMBULATORY_CARE_PROVIDER_SITE_OTHER): Payer: 59 | Admitting: Nurse Practitioner

## 2022-10-24 ENCOUNTER — Encounter: Payer: Self-pay | Admitting: Nurse Practitioner

## 2022-10-24 VITALS — BP 138/80 | HR 81 | Temp 97.6°F | Resp 16 | Ht 60.0 in | Wt 170.8 lb

## 2022-10-24 DIAGNOSIS — M5442 Lumbago with sciatica, left side: Secondary | ICD-10-CM | POA: Diagnosis not present

## 2022-10-24 DIAGNOSIS — I10 Essential (primary) hypertension: Secondary | ICD-10-CM | POA: Diagnosis not present

## 2022-10-24 DIAGNOSIS — G4700 Insomnia, unspecified: Secondary | ICD-10-CM | POA: Diagnosis not present

## 2022-10-24 DIAGNOSIS — E876 Hypokalemia: Secondary | ICD-10-CM | POA: Diagnosis not present

## 2022-10-24 DIAGNOSIS — K5730 Diverticulosis of large intestine without perforation or abscess without bleeding: Secondary | ICD-10-CM

## 2022-10-24 MED ORDER — ESZOPICLONE 3 MG PO TABS
3.0000 mg | ORAL_TABLET | Freq: Every day | ORAL | 2 refills | Status: DC
Start: 2022-10-24 — End: 2023-01-16

## 2022-10-24 MED ORDER — BUMEX 1 MG PO TABS
2.0000 mg | ORAL_TABLET | Freq: Every day | ORAL | 1 refills | Status: DC
Start: 2022-10-24 — End: 2023-04-15

## 2022-10-24 MED ORDER — POTASSIUM CHLORIDE CRYS ER 20 MEQ PO TBCR
20.0000 meq | EXTENDED_RELEASE_TABLET | Freq: Two times a day (BID) | ORAL | 1 refills | Status: DC
Start: 2022-10-24 — End: 2023-01-16
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
## 2022-10-25 ENCOUNTER — Other Ambulatory Visit
Admission: RE | Admit: 2022-10-25 | Discharge: 2022-10-25 | Disposition: A | Discharge status: Home / Self Care | Payer: 59 | Source: Ambulatory Visit | Attending: Nurse Practitioner | Admitting: Nurse Practitioner

## 2022-10-25 DIAGNOSIS — E876 Hypokalemia: Secondary | ICD-10-CM | POA: Diagnosis present

## 2022-10-25 LAB — POTASSIUM: Potassium: 4.1 mmol/L (ref 3.5–5.1)

## 2022-10-29 NOTE — Group Note (Deleted)

## 2022-11-10 ENCOUNTER — Other Ambulatory Visit: Payer: Self-pay | Admitting: Nurse Practitioner

## 2022-11-10 ENCOUNTER — Encounter: Payer: Self-pay | Admitting: Nurse Practitioner

## 2022-11-10 DIAGNOSIS — S39012A Strain of muscle, fascia and tendon of lower back, initial encounter: Secondary | ICD-10-CM

## 2022-12-05 ENCOUNTER — Other Ambulatory Visit: Payer: Self-pay | Admitting: Nurse Practitioner

## 2022-12-24 ENCOUNTER — Other Ambulatory Visit: Payer: Self-pay | Admitting: Internal Medicine

## 2023-01-16 ENCOUNTER — Encounter: Payer: Self-pay | Admitting: Nurse Practitioner

## 2023-01-16 ENCOUNTER — Ambulatory Visit (INDEPENDENT_AMBULATORY_CARE_PROVIDER_SITE_OTHER): Payer: 59 | Admitting: Nurse Practitioner

## 2023-01-16 VITALS — BP 135/74 | HR 78 | Temp 98.2°F | Resp 16 | Ht 60.0 in | Wt 163.6 lb

## 2023-01-16 DIAGNOSIS — E039 Hypothyroidism, unspecified: Secondary | ICD-10-CM | POA: Diagnosis not present

## 2023-01-16 DIAGNOSIS — E876 Hypokalemia: Secondary | ICD-10-CM

## 2023-01-16 DIAGNOSIS — Z23 Encounter for immunization: Secondary | ICD-10-CM

## 2023-01-16 DIAGNOSIS — I10 Essential (primary) hypertension: Secondary | ICD-10-CM

## 2023-01-16 DIAGNOSIS — F4321 Adjustment disorder with depressed mood: Secondary | ICD-10-CM

## 2023-01-16 DIAGNOSIS — Z79899 Other long term (current) drug therapy: Secondary | ICD-10-CM

## 2023-01-16 DIAGNOSIS — G4700 Insomnia, unspecified: Secondary | ICD-10-CM | POA: Diagnosis not present

## 2023-01-16 DIAGNOSIS — E782 Mixed hyperlipidemia: Secondary | ICD-10-CM

## 2023-01-16 MED ORDER — PANTOPRAZOLE SODIUM 40 MG PO TBEC
40.0000 mg | DELAYED_RELEASE_TABLET | Freq: Two times a day (BID) | ORAL | 3 refills | Status: DC
Start: 2023-01-16 — End: 2023-09-25

## 2023-01-16 MED ORDER — PNEUMOCOCCAL 20-VAL CONJ VACC 0.5 ML IM SUSY: 0.5000 mL | PREFILLED_SYRINGE | INTRAMUSCULAR | 0 refills | Status: AC

## 2023-01-16 MED ORDER — LEVOTHYROXINE SODIUM 100 MCG PO TABS
100.0000 ug | ORAL_TABLET | Freq: Every day | ORAL | 1 refills | Status: DC
Start: 2023-01-16 — End: 2023-04-15

## 2023-01-16 MED ORDER — ESZOPICLONE 3 MG PO TABS: 3.0000 mg | ORAL_TABLET | Freq: Every day | ORAL | 2 refills | Status: DC

## 2023-01-16 MED ORDER — SPIRONOLACTONE 25 MG PO TABS
25.0000 mg | ORAL_TABLET | Freq: Every day | ORAL | 1 refills | Status: DC
Start: 2023-01-16 — End: 2023-07-02

## 2023-01-16 MED ORDER — CYCLOBENZAPRINE HCL 10 MG PO TABS
10.0000 mg | ORAL_TABLET | Freq: Three times a day (TID) | ORAL | 2 refills | Status: DC | PRN
Start: 2023-01-16 — End: 2023-04-15

## 2023-01-16 MED ORDER — EMPAGLIFLOZIN 10 MG PO TABS
10.0000 mg | ORAL_TABLET | Freq: Every day | ORAL | 11 refills | Status: DC
Start: 2023-01-16 — End: 2023-09-25

## 2023-01-16 MED ORDER — LINACLOTIDE 290 MCG PO CAPS
290.0000 ug | ORAL_CAPSULE | Freq: Every day | ORAL | 1 refills | Status: DC
Start: 2023-01-16 — End: 2023-09-25

## 2023-01-16 MED ORDER — POTASSIUM CHLORIDE CRYS ER 20 MEQ PO TBCR: 20.0000 meq | EXTENDED_RELEASE_TABLET | Freq: Two times a day (BID) | ORAL | 1 refills | Status: DC
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
## 2023-01-22 ENCOUNTER — Other Ambulatory Visit
Admission: RE | Admit: 2023-01-22 | Discharge: 2023-01-22 | Disposition: A | Discharge status: Home / Self Care | Payer: 59 | Source: Ambulatory Visit | Attending: Nurse Practitioner | Admitting: Nurse Practitioner

## 2023-01-22 DIAGNOSIS — E039 Hypothyroidism, unspecified: Secondary | ICD-10-CM | POA: Diagnosis not present

## 2023-01-22 DIAGNOSIS — E876 Hypokalemia: Secondary | ICD-10-CM | POA: Diagnosis present

## 2023-01-22 DIAGNOSIS — G4700 Insomnia, unspecified: Secondary | ICD-10-CM | POA: Insufficient documentation

## 2023-01-22 DIAGNOSIS — E782 Mixed hyperlipidemia: Secondary | ICD-10-CM | POA: Insufficient documentation

## 2023-01-22 LAB — COMPREHENSIVE METABOLIC PANEL
ALT: 25 U/L (ref 0–44)
AST: 17 U/L (ref 15–41)
Albumin: 4.2 g/dL (ref 3.5–5.0)
Alkaline Phosphatase: 83 U/L (ref 38–126)
Anion gap: 12 (ref 5–15)
BUN: 28 mg/dL — ABNORMAL HIGH (ref 8–23)
CO2: 29 mmol/L (ref 22–32)
Calcium: 9.1 mg/dL (ref 8.9–10.3)
Chloride: 100 mmol/L (ref 98–111)
Creatinine, Ser: 1.02 mg/dL — ABNORMAL HIGH (ref 0.44–1.00)
GFR, Estimated: 58 mL/min — ABNORMAL LOW (ref >60)
Glucose, Bld: 92 mg/dL (ref 70–99)
Potassium: 3.5 mmol/L (ref 3.5–5.1)
Sodium: 141 mmol/L (ref 135–145)
Total Bilirubin: 1 mg/dL (ref <1.2)
Total Protein: 7.1 g/dL (ref 6.5–8.1)

## 2023-01-22 LAB — T4, FREE: Free T4: 1.31 ng/dL — ABNORMAL HIGH (ref 0.61–1.12)

## 2023-01-22 LAB — CBC WITH DIFFERENTIAL/PLATELET
Abs Immature Granulocytes: 0.05 10*3/uL (ref 0.00–0.07)
Basophils Absolute: 0.1 10*3/uL (ref 0.0–0.1)
Basophils Relative: 1 %
Eosinophils Absolute: 0.2 10*3/uL (ref 0.0–0.5)
Eosinophils Relative: 2 %
HCT: 49.7 % — ABNORMAL HIGH (ref 36.0–46.0)
Hemoglobin: 17.3 g/dL — ABNORMAL HIGH (ref 12.0–15.0)
Immature Granulocytes: 0 %
Lymphocytes Relative: 17 %
Lymphs Abs: 1.9 10*3/uL (ref 0.7–4.0)
MCH: 31.4 pg (ref 26.0–34.0)
MCHC: 34.8 g/dL (ref 30.0–36.0)
MCV: 90.2 fL (ref 80.0–100.0)
Monocytes Absolute: 0.5 10*3/uL (ref 0.1–1.0)
Monocytes Relative: 4 %
Neutro Abs: 8.7 10*3/uL — ABNORMAL HIGH (ref 1.7–7.7)
Neutrophils Relative %: 76 %
Platelets: 277 10*3/uL (ref 150–400)
RBC: 5.51 MIL/uL — ABNORMAL HIGH (ref 3.87–5.11)
RDW: 12.8 % (ref 11.5–15.5)
WBC: 11.4 10*3/uL — ABNORMAL HIGH (ref 4.0–10.5)
nRBC: 0 % (ref 0.0–0.2)

## 2023-01-22 LAB — LIPID PANEL
Cholesterol: 233 mg/dL — ABNORMAL HIGH (ref 0–200)
HDL: 78 mg/dL (ref >40)
LDL Cholesterol: 134 mg/dL — ABNORMAL HIGH (ref 0–99)
Total CHOL/HDL Ratio: 3 {ratio}
Triglycerides: 106 mg/dL (ref <150)
VLDL: 21 mg/dL (ref 0–40)

## 2023-01-22 LAB — TSH: TSH: 0.924 u[IU]/mL (ref 0.350–4.500)
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
## 2023-01-24 ENCOUNTER — Ambulatory Visit: Payer: 59 | Admitting: Neurosurgery

## 2023-01-24 ENCOUNTER — Encounter: Payer: Self-pay | Admitting: Neurosurgery

## 2023-01-24 VITALS — BP 146/84 | Ht 60.0 in | Wt 167.0 lb

## 2023-01-24 DIAGNOSIS — M25511 Pain in right shoulder: Secondary | ICD-10-CM

## 2023-01-24 DIAGNOSIS — G9589 Other specified diseases of spinal cord: Secondary | ICD-10-CM

## 2023-01-24 DIAGNOSIS — G8929 Other chronic pain: Secondary | ICD-10-CM

## 2023-01-24 DIAGNOSIS — M5412 Radiculopathy, cervical region: Secondary | ICD-10-CM | POA: Diagnosis not present

## 2023-01-24 DIAGNOSIS — M4802 Spinal stenosis, cervical region: Secondary | ICD-10-CM | POA: Diagnosis not present

## 2023-02-04 ENCOUNTER — Telehealth: Payer: Self-pay | Admitting: Nurse Practitioner

## 2023-02-04 NOTE — Telephone Encounter (Signed)
Patient called to schedule appointment for back pain. No available appointments until January. Patient stated she will go to [facility]

## 2023-02-19 ENCOUNTER — Encounter: Payer: Self-pay | Admitting: Nurse Practitioner

## 2023-03-20 ENCOUNTER — Other Ambulatory Visit (HOSPITAL_COMMUNITY): Payer: Self-pay | Admitting: Physical Medicine & Rehabilitation

## 2023-03-20 DIAGNOSIS — M5414 Radiculopathy, thoracic region: Secondary | ICD-10-CM

## 2023-03-21 ENCOUNTER — Encounter: Payer: Self-pay | Admitting: Neurosurgery

## 2023-03-21 ENCOUNTER — Ambulatory Visit: Payer: 59 | Admitting: Neurosurgery

## 2023-03-21 ENCOUNTER — Other Ambulatory Visit: Payer: Self-pay | Admitting: Physical Medicine & Rehabilitation

## 2023-03-21 VITALS — BP 144/92 | Ht 60.0 in | Wt 167.0 lb

## 2023-03-21 DIAGNOSIS — M4802 Spinal stenosis, cervical region: Secondary | ICD-10-CM

## 2023-03-21 DIAGNOSIS — M5412 Radiculopathy, cervical region: Secondary | ICD-10-CM

## 2023-03-21 DIAGNOSIS — M25511 Pain in right shoulder: Secondary | ICD-10-CM | POA: Diagnosis not present

## 2023-03-21 DIAGNOSIS — G8929 Other chronic pain: Secondary | ICD-10-CM

## 2023-03-21 DIAGNOSIS — G9589 Other specified diseases of spinal cord: Secondary | ICD-10-CM | POA: Diagnosis not present
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
## 2023-04-03 NOTE — Discharge Instructions (Signed)

## 2023-04-04 ENCOUNTER — Ambulatory Visit
Admission: RE | Admit: 2023-04-04 | Discharge: 2023-04-04 | Disposition: A | Discharge status: Home / Self Care | Payer: 59 | Source: Ambulatory Visit | Attending: Physical Medicine & Rehabilitation | Admitting: Physical Medicine & Rehabilitation

## 2023-04-04 DIAGNOSIS — M5412 Radiculopathy, cervical region: Secondary | ICD-10-CM

## 2023-04-04 MED ORDER — IOPAMIDOL (ISOVUE-M 300) INJECTION 61%
1.0000 mL | Freq: Once | INTRAMUSCULAR | Status: AC | PRN
  Administered 2023-04-04: 1 mL via EPIDURAL

## 2023-04-04 MED ORDER — TRIAMCINOLONE ACETONIDE 40 MG/ML IJ SUSP (RADIOLOGY)
60.0000 mg | Freq: Once | INTRAMUSCULAR | Status: AC
  Administered 2023-04-04: 60 mg via EPIDURAL

## 2023-04-09 ENCOUNTER — Ambulatory Visit (INDEPENDENT_AMBULATORY_CARE_PROVIDER_SITE_OTHER): Payer: 59 | Admitting: Neurosurgery

## 2023-04-09 DIAGNOSIS — M5412 Radiculopathy, cervical region: Secondary | ICD-10-CM

## 2023-04-09 DIAGNOSIS — G8929 Other chronic pain: Secondary | ICD-10-CM

## 2023-04-09 DIAGNOSIS — M25511 Pain in right shoulder: Secondary | ICD-10-CM

## 2023-04-09 MED ORDER — DICLOFENAC SODIUM 50 MG PO TBEC: 50.0000 mg | DELAYED_RELEASE_TABLET | Freq: Two times a day (BID) | ORAL | 0 refills | Status: DC
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
## 2023-04-15 ENCOUNTER — Encounter: Payer: Self-pay | Admitting: Nurse Practitioner

## 2023-04-15 ENCOUNTER — Ambulatory Visit (INDEPENDENT_AMBULATORY_CARE_PROVIDER_SITE_OTHER): Payer: 59 | Admitting: Nurse Practitioner

## 2023-04-15 VITALS — BP 132/78 | HR 79 | Temp 96.8°F | Resp 16 | Ht 60.0 in | Wt 185.4 lb

## 2023-04-15 DIAGNOSIS — J449 Chronic obstructive pulmonary disease, unspecified: Secondary | ICD-10-CM | POA: Diagnosis not present

## 2023-04-15 DIAGNOSIS — M25552 Pain in left hip: Secondary | ICD-10-CM

## 2023-04-15 DIAGNOSIS — E039 Hypothyroidism, unspecified: Secondary | ICD-10-CM

## 2023-04-15 DIAGNOSIS — I10 Essential (primary) hypertension: Secondary | ICD-10-CM

## 2023-04-15 DIAGNOSIS — G4700 Insomnia, unspecified: Secondary | ICD-10-CM

## 2023-04-15 DIAGNOSIS — G8929 Other chronic pain: Secondary | ICD-10-CM

## 2023-04-15 MED ORDER — BUMETANIDE 1 MG PO TABS: 2.0000 mg | ORAL_TABLET | Freq: Every day | ORAL | 1 refills | Status: DC

## 2023-04-15 MED ORDER — ESZOPICLONE 3 MG PO TABS
3.0000 mg | ORAL_TABLET | Freq: Every day | ORAL | 2 refills | Status: DC
Start: 2023-04-15 — End: 2023-07-02

## 2023-04-15 MED ORDER — BUMETANIDE 2 MG PO TABS: 2.0000 mg | ORAL_TABLET | Freq: Every day | ORAL | 1 refills | Status: DC

## 2023-04-15 MED ORDER — LEVOTHYROXINE SODIUM 100 MCG PO TABS: 100.0000 ug | ORAL_TABLET | Freq: Every day | ORAL | 1 refills | Status: DC

## 2023-04-15 MED ORDER — CYCLOBENZAPRINE HCL 10 MG PO TABS: 10.0000 mg | ORAL_TABLET | Freq: Three times a day (TID) | ORAL | 2 refills | Status: DC | PRN
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
## 2023-05-06 ENCOUNTER — Encounter: Payer: Self-pay | Admitting: Nurse Practitioner

## 2023-05-06 ENCOUNTER — Ambulatory Visit: Payer: 59 | Admitting: Nurse Practitioner

## 2023-05-06 VITALS — BP 130/80 | HR 67 | Temp 96.8°F | Resp 16 | Ht 60.0 in | Wt 193.4 lb

## 2023-05-06 DIAGNOSIS — I10 Essential (primary) hypertension: Secondary | ICD-10-CM | POA: Diagnosis not present

## 2023-05-06 DIAGNOSIS — Z0000 Encounter for general adult medical examination without abnormal findings: Secondary | ICD-10-CM | POA: Diagnosis not present

## 2023-05-06 DIAGNOSIS — Z1231 Encounter for screening mammogram for malignant neoplasm of breast: Secondary | ICD-10-CM

## 2023-05-06 DIAGNOSIS — E876 Hypokalemia: Secondary | ICD-10-CM

## 2023-05-06 DIAGNOSIS — R252 Cramp and spasm: Secondary | ICD-10-CM

## 2023-05-06 DIAGNOSIS — R053 Chronic cough: Secondary | ICD-10-CM | POA: Diagnosis not present

## 2023-05-06 DIAGNOSIS — R7303 Prediabetes: Secondary | ICD-10-CM

## 2023-05-06 DIAGNOSIS — J449 Chronic obstructive pulmonary disease, unspecified: Secondary | ICD-10-CM | POA: Diagnosis not present

## 2023-05-06 DIAGNOSIS — R7301 Impaired fasting glucose: Secondary | ICD-10-CM

## 2023-05-06 LAB — POCT GLYCOSYLATED HEMOGLOBIN (HGB A1C): Hemoglobin A1C: 5.7 % — AB (ref 4.0–5.6)

## 2023-05-06 MED ORDER — BREZTRI AEROSPHERE 160-9-4.8 MCG/ACT IN AERO: 2.0000 | INHALATION_SPRAY | Freq: Two times a day (BID) | RESPIRATORY_TRACT | 3 refills | Status: DC

## 2023-05-06 MED ORDER — PROMETHAZINE-DM 6.25-15 MG/5ML PO SYRP
5.0000 mL | ORAL_SOLUTION | Freq: Four times a day (QID) | ORAL | 1 refills | Status: DC | PRN
Start: 2023-05-06 — End: 2023-07-02
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
## 2023-05-10 ENCOUNTER — Other Ambulatory Visit
Admission: RE | Admit: 2023-05-10 | Discharge: 2023-05-10 | Disposition: A | Discharge status: Home / Self Care | Attending: Nurse Practitioner | Admitting: Nurse Practitioner

## 2023-05-10 DIAGNOSIS — R252 Cramp and spasm: Secondary | ICD-10-CM | POA: Diagnosis present

## 2023-05-10 LAB — BASIC METABOLIC PANEL
Anion gap: 8 (ref 5–15)
BUN: 25 mg/dL — ABNORMAL HIGH (ref 8–23)
CO2: 28 mmol/L (ref 22–32)
Calcium: 8.7 mg/dL — ABNORMAL LOW (ref 8.9–10.3)
Chloride: 107 mmol/L (ref 98–111)
Creatinine, Ser: 0.92 mg/dL (ref 0.44–1.00)
GFR, Estimated: >60 mL/min (ref >60)
Glucose, Bld: 110 mg/dL — ABNORMAL HIGH (ref 70–99)
Potassium: 4.1 mmol/L (ref 3.5–5.1)
Sodium: 143 mmol/L (ref 135–145)

## 2023-05-11 ENCOUNTER — Encounter: Payer: Self-pay | Admitting: Nurse Practitioner

## 2023-05-16 ENCOUNTER — Other Ambulatory Visit: Payer: Self-pay | Admitting: Orthopedic Surgery

## 2023-05-16 ENCOUNTER — Ambulatory Visit
Admission: RE | Admit: 2023-05-16 | Discharge: 2023-05-16 | Disposition: A | Discharge status: Home / Self Care | Source: Ambulatory Visit | Attending: Orthopedic Surgery | Admitting: Orthopedic Surgery

## 2023-05-16 DIAGNOSIS — M19011 Primary osteoarthritis, right shoulder: Secondary | ICD-10-CM

## 2023-05-20 ENCOUNTER — Other Ambulatory Visit: Payer: Self-pay | Admitting: Nurse Practitioner

## 2023-05-20 ENCOUNTER — Other Ambulatory Visit: Payer: Self-pay | Admitting: Neurosurgery

## 2023-05-20 DIAGNOSIS — M5442 Lumbago with sciatica, left side: Secondary | ICD-10-CM

## 2023-05-20 DIAGNOSIS — S39012A Strain of muscle, fascia and tendon of lower back, initial encounter: Secondary | ICD-10-CM

## 2023-05-21 ENCOUNTER — Other Ambulatory Visit: Payer: Self-pay

## 2023-05-21 NOTE — Telephone Encounter (Signed)
 Patient states that she did not request this medication. [facility] sent an automatic script. She will let us know when she needs this filled again.

## 2023-05-21 NOTE — Telephone Encounter (Signed)
 She was given 3 month supply of diclofenac on 04/09/23. She should not need a refill. Please let her know.   Diclofenac sent back denied.

## 2023-05-22 ENCOUNTER — Other Ambulatory Visit: Payer: Self-pay | Admitting: Orthopedic Surgery

## 2023-05-26 ENCOUNTER — Other Ambulatory Visit: Payer: Self-pay | Admitting: Nurse Practitioner

## 2023-05-26 DIAGNOSIS — G4700 Insomnia, unspecified: Secondary | ICD-10-CM

## 2023-05-27 NOTE — Telephone Encounter (Signed)
 Next 06/06/23

## 2023-05-28 ENCOUNTER — Encounter
Admission: RE | Admit: 2023-05-28 | Discharge: 2023-05-28 | Disposition: A | Discharge status: Home / Self Care | Source: Ambulatory Visit | Attending: Orthopedic Surgery | Admitting: Orthopedic Surgery

## 2023-05-28 ENCOUNTER — Encounter: Payer: Self-pay | Admitting: Urgent Care

## 2023-05-28 ENCOUNTER — Encounter (HOSPITAL_COMMUNITY): Payer: Self-pay | Admitting: Urgent Care

## 2023-05-28 ENCOUNTER — Other Ambulatory Visit: Payer: Self-pay

## 2023-05-28 VITALS — BP 134/81 | HR 64 | Resp 18 | Ht 60.0 in | Wt 193.0 lb

## 2023-05-28 DIAGNOSIS — Z01818 Encounter for other preprocedural examination: Secondary | ICD-10-CM

## 2023-05-28 DIAGNOSIS — R9431 Abnormal electrocardiogram [ECG] [EKG]: Secondary | ICD-10-CM | POA: Diagnosis not present

## 2023-05-28 DIAGNOSIS — Z01810 Encounter for preprocedural cardiovascular examination: Secondary | ICD-10-CM | POA: Diagnosis not present

## 2023-05-28 HISTORY — DX: Pulmonary hypertension, unspecified: I27.20

## 2023-05-28 LAB — COMPREHENSIVE METABOLIC PANEL WITH GFR
ALT: 36 U/L (ref 0–44)
AST: 23 U/L (ref 15–41)
Albumin: 3.7 g/dL (ref 3.5–5.0)
Alkaline Phosphatase: 80 U/L (ref 38–126)
Anion gap: 7 (ref 5–15)
BUN: 24 mg/dL — ABNORMAL HIGH (ref 8–23)
CO2: 27 mmol/L (ref 22–32)
Calcium: 8.6 mg/dL — ABNORMAL LOW (ref 8.9–10.3)
Chloride: 104 mmol/L (ref 98–111)
Creatinine, Ser: 0.92 mg/dL (ref 0.44–1.00)
GFR, Estimated: >60 mL/min (ref >60)
Glucose, Bld: 92 mg/dL (ref 70–99)
Potassium: 3.6 mmol/L (ref 3.5–5.1)
Sodium: 138 mmol/L (ref 135–145)
Total Bilirubin: 0.9 mg/dL (ref 0.0–1.2)
Total Protein: 6.3 g/dL — ABNORMAL LOW (ref 6.5–8.1)

## 2023-05-28 LAB — CBC WITH DIFFERENTIAL/PLATELET
Abs Immature Granulocytes: 0.03 10*3/uL (ref 0.00–0.07)
Basophils Absolute: 0.1 10*3/uL (ref 0.0–0.1)
Basophils Relative: 1 %
Eosinophils Absolute: 0.5 10*3/uL (ref 0.0–0.5)
Eosinophils Relative: 8 %
HCT: 38.6 % (ref 36.0–46.0)
Hemoglobin: 13.2 g/dL (ref 12.0–15.0)
Immature Granulocytes: 0 %
Lymphocytes Relative: 23 %
Lymphs Abs: 1.6 10*3/uL (ref 0.7–4.0)
MCH: 31.2 pg (ref 26.0–34.0)
MCHC: 34.2 g/dL (ref 30.0–36.0)
MCV: 91.3 fL (ref 80.0–100.0)
Monocytes Absolute: 0.4 10*3/uL (ref 0.1–1.0)
Monocytes Relative: 6 %
Neutro Abs: 4.3 10*3/uL (ref 1.7–7.7)
Neutrophils Relative %: 62 %
Platelets: 213 10*3/uL (ref 150–400)
RBC: 4.23 MIL/uL (ref 3.87–5.11)
RDW: 13.6 % (ref 11.5–15.5)
WBC: 6.8 10*3/uL (ref 4.0–10.5)
nRBC: 0 % (ref 0.0–0.2)

## 2023-05-28 LAB — URINALYSIS, ROUTINE W REFLEX MICROSCOPIC
Bilirubin Urine: NEGATIVE
Glucose, UA: NEGATIVE mg/dL
Hgb urine dipstick: NEGATIVE
Ketones, ur: NEGATIVE mg/dL
Leukocytes,Ua: NEGATIVE
Nitrite: NEGATIVE
Protein, ur: NEGATIVE mg/dL
Specific Gravity, Urine: 1.01 (ref 1.005–1.030)
pH: 5 (ref 5.0–8.0)

## 2023-05-28 LAB — TYPE AND SCREEN
ABO/RH(D): A NEG
Antibody Screen: NEGATIVE

## 2023-05-28 LAB — SURGICAL PCR SCREEN
MRSA, PCR: NEGATIVE
Staphylococcus aureus: NEGATIVE

## 2023-05-28 NOTE — Patient Instructions (Addendum)
 Your procedure is scheduled on: Tuesday 06/04/23 To find out your arrival time, please call [phone] between 1PM - 3PM on: Monday 06/03/23   Report to the Registration Desk on the 1st floor of [facility]. Free Valet parking is available.  If your arrival time is 6:00 am, do not arrive before that time as the [facility] entrance doors do not open until 6:00 am.  REMEMBER: Instructions that are not followed completely may result in serious medical risk, up to and including death; or upon the discretion of your surgeon and anesthesiologist your surgery may need to be rescheduled.  Do not eat food after midnight the night before surgery.  No gum chewing or hard candies.  You may however, drink CLEAR liquids up to 2 hours before you are scheduled to arrive for your surgery. Do not drink anything within 2 hours of your scheduled arrival time.  Clear liquids include: - water  - apple juice without pulp - gatorade (not RED colors) - black coffee or tea (Do NOT add milk or creamers to the coffee or tea) Do NOT drink anything that is not on this list.  Type 1 and Type 2 diabetics should only drink water.  In addition, your doctor has ordered for you to drink the provided:  Ensure Pre-Surgery Clear Carbohydrate Drink  Drinking this carbohydrate drink up to two hours before surgery helps to reduce insulin resistance and improve patient outcomes. Please complete drinking 2 hours before scheduled arrival time.  One week prior to surgery: Stop Anti-inflammatories (NSAIDS) such as Advil, Aleve, Ibuprofen, Motrin, Naproxen, Naprosyn and Aspirin based products such as Excedrin, Goody's Powder, BC Powder. You may however, continue to take Tylenol if needed for pain up until the day of surgery.  Stop ANY OVER THE COUNTER supplements and vitamins today 05/28/23 until after surgery.  Continue taking all prescribed medications with the exception of the following: diclofenac (VOLTAREN) stop  taking today 05/28/23 Jardiance stop taking Friday 05/31/23  TAKE ONLY THESE MEDICATIONS THE MORNING OF SURGERY WITH A SIP OF WATER:  gabapentin (NEURONTIN) 300 MG capsule  levothyroxine (SYNTHROID) 100 MCG tablet  pantoprazole (PROTONIX) 40 MG tablet   Use inhalers on the day of surgery and bring to the hospital.  No Alcohol for 24 hours before or after surgery.  No Smoking including e-cigarettes for 24 hours before surgery.  No chewable tobacco products for at least 6 hours before surgery.  No nicotine patches on the day of surgery.  Do not use any "recreational" drugs for at least a week (preferably 2 weeks) before your surgery.  Please be advised that the combination of cocaine and anesthesia may have negative outcomes, up to and including death. If you test positive for cocaine, your surgery will be cancelled.  On the morning of surgery brush your teeth with toothpaste and water, you may rinse your mouth with mouthwash if you wish. Do not swallow any toothpaste or mouthwash.  Use CHG Soap or wipes as directed on instruction sheet.  Do not wear lotions, powders, or perfumes on the day of surgery   Do not shave body hair from the neck down 48 hours before surgery.  Wear comfortable clothing (specific to your surgery type) to the hospital.  Do not wear jewelry, make-up, hairpins, clips or nail polish.  For welded (permanent) jewelry: bracelets, anklets, waist bands, etc.  Please have this removed prior to surgery.  If it is not removed, there is a chance that hospital personnel will  need to cut it off on the day of surgery. Contact lenses, hearing aids and dentures may not be worn into surgery.  Do not bring valuables to the hospital. [facility] is not responsible for any missing/lost belongings or valuables.   Total Shoulder Arthroplasty:  use Benzoyl Peroxide 5% Gel as directed on instruction sheet.  Notify your doctor if there is any change in your medical condition (cold,  fever, infection).  If you are being discharged the day of surgery, you will not be allowed to drive home. You will need a responsible individual to drive you home and stay with you for 24 hours after surgery.   If you are taking public transportation, you will need to have a responsible individual with you.  If you are being admitted to the hospital overnight, leave your suitcase in the car. After surgery it may be brought to your room.  In case of increased patient census, it may be necessary for you, the patient, to continue your postoperative care in the Same Day Surgery department.  After surgery, you can help prevent lung complications by doing breathing exercises.  Take deep breaths and cough every 1-2 hours. Your doctor may order a device called an Incentive Spirometer to help you take deep breaths.  Surgery Visitation Policy:  Patients undergoing a surgery or procedure may have two family members or support persons with them as long as the person is not COVID-19 positive or experiencing its symptoms.   Inpatient Visitation:    Visiting hours are 7 a.m. to 8 p.m. Up to four visitors are allowed at one time in a patient room. The visitors may rotate out with other people during the day. One designated support person (adult) may remain overnight.  Please call the Pre-admissions Testing Dept. at [phone] if you have any questions about these instructions.    Pre-operative 5 CHG Bath Instructions   You can play a key role in reducing the risk of infection after surgery. Your skin needs to be as free of germs as possible. You can reduce the number of germs on your skin by washing with CHG (chlorhexidine gluconate) soap before surgery. CHG is an antiseptic soap that kills germs and continues to kill germs even after washing.   DO NOT use if you have an allergy to chlorhexidine/CHG or antibacterial soaps. If your skin becomes reddened or irritated, stop using the CHG and notify  one of our RNs at [phone].   Please shower with the CHG soap starting 4 days before surgery using the following schedule:   Friday 05/31/23 - Tuesday 06/04/23    Please keep in mind the following:  DO NOT shave, including legs and underarms, starting the day of your first shower.   You may shave your face at any point before/day of surgery.  Place clean sheets on your bed the day you start using CHG soap. Use a clean washcloth (not used since being washed) for each shower. DO NOT sleep with pets once you start using the CHG.   CHG Shower Instructions:  If you choose to wash your hair and private area, wash first with your normal shampoo/soap.  After you use shampoo/soap, rinse your hair and body thoroughly to remove shampoo/soap residue.  Turn the water OFF and apply about 3 tablespoons (45 ml) of CHG soap to a CLEAN washcloth.  Apply CHG soap ONLY FROM YOUR NECK DOWN TO YOUR TOES (washing for 3-5 minutes)  DO NOT use CHG soap on face,  private areas, open wounds, or sores.  Pay special attention to the area where your surgery is being performed.  If you are having back surgery, having someone wash your back for you may be helpful. Wait 2 minutes after CHG soap is applied, then you may rinse off the CHG soap.  Pat dry with a clean towel  Put on clean clothes/pajamas   If you choose to wear lotion, please use ONLY the CHG-compatible lotions on the back of this paper.     Additional instructions for the day of surgery: DO NOT APPLY any lotions, deodorants, cologne, or perfumes.   Put on clean/comfortable clothes.  Brush your teeth.  Ask your nurse before applying any prescription medications to the skin.      CHG Compatible Lotions   Aveeno Moisturizing lotion  Cetaphil Moisturizing Cream  Cetaphil Moisturizing Lotion  Clairol Herbal Essence Moisturizing Lotion, Dry Skin  Clairol Herbal Essence Moisturizing Lotion, Extra Dry Skin  Clairol Herbal Essence Moisturizing  Lotion, Normal Skin  Curel Age Defying Therapeutic Moisturizing Lotion with Alpha Hydroxy  Curel Extreme Care Body Lotion  Curel Soothing Hands Moisturizing Hand Lotion  Curel Therapeutic Moisturizing Cream, Fragrance-Free  Curel Therapeutic Moisturizing Lotion, Fragrance-Free  Curel Therapeutic Moisturizing Lotion, Original Formula  Eucerin Daily Replenishing Lotion  Eucerin Dry Skin Therapy Plus Alpha Hydroxy Crme  Eucerin Dry Skin Therapy Plus Alpha Hydroxy Lotion  Eucerin Original Crme  Eucerin Original Lotion  Eucerin Plus Crme Eucerin Plus Lotion  Eucerin TriLipid Replenishing Lotion  Keri Anti-Bacterial Hand Lotion  Keri Deep Conditioning Original Lotion Dry Skin Formula Softly Scented  Keri Deep Conditioning Original Lotion, Fragrance Free Sensitive Skin Formula  Keri Lotion Fast Absorbing Fragrance Free Sensitive Skin Formula  Keri Lotion Fast Absorbing Softly Scented Dry Skin Formula  Keri Original Lotion  Keri Skin Renewal Lotion Keri Silky Smooth Lotion  Keri Silky Smooth Sensitive Skin Lotion  Nivea Body Creamy Conditioning Oil  Nivea Body Extra Enriched Lotion  Nivea Body Original Lotion  Nivea Body Sheer Moisturizing Lotion Nivea Crme  Nivea Skin Firming Lotion  NutraDerm 30 Skin Lotion  NutraDerm Skin Lotion  NutraDerm Therapeutic Skin Cream  NutraDerm Therapeutic Skin Lotion  ProShield Protective Hand Cream  Provon moisturizing lotion  Preparing for Total Shoulder Arthroplasty  Before surgery, you can play an important role by reducing the number of germs on your skin by using the following products:  Benzoyl Peroxide Gel  o Reduces the number of germs present on the skin  o Applied twice a day to shoulder area starting two days before surgery  Chlorhexidine Gluconate (CHG) Soap  o An antiseptic cleaner that kills germs and bonds with the skin to continue killing germs even after washing  o Used for showering the night before surgery and morning of  surgery  BENZOYL PEROXIDE 5% GEL  Please do not use if you have an allergy to benzoyl peroxide. If your skin becomes reddened/irritated stop using the benzoyl peroxide.  Starting two days before surgery, apply as follows:  1. Apply benzoyl peroxide in the morning and at night. Apply after taking a shower. If you are not taking a shower, clean entire shoulder front, back, and side along with the armpit with a clean wet washcloth.  2. Place a quarter-sized dollop on your shoulder and rub in thoroughly, making sure to cover the front, back, and side of your shoulder, along with the armpit.  2 days before ____ AM ____ PM Sunday 06/02/23 1 day before ____ AM  ____ PM   Monday 06/03/23  3. Do this twice a day for two days. (Last application is the night before surgery, AFTER using the CHG soap).  4. Do NOT apply benzoyl peroxide gel on the day of surgery.  How to Use an Incentive Spirometer  An incentive spirometer is a tool that measures how well you are filling your lungs with each breath. Learning to take long, deep breaths using this tool can help you keep your lungs clear and active. This may help to reverse or lessen your chance of developing breathing (pulmonary) problems, especially infection. You may be asked to use a spirometer: After a surgery. If you have a lung problem or a history of smoking. After a long period of time when you have been unable to move or be active. If the spirometer includes an indicator to show the highest number that you have reached, your health care provider or respiratory therapist will help you set a goal. Keep a log of your progress as told by your health care provider. What are the risks? Breathing too quickly may cause dizziness or cause you to pass out. Take your time so you do not get dizzy or light-headed. If you are in pain, you may need to take pain medicine before doing incentive spirometry. It is harder to take a deep breath if you are having  pain. How to use your incentive spirometer  Sit up on the edge of your bed or on a chair. Hold the incentive spirometer so that it is in an upright position. Before you use the spirometer, breathe out normally. Place the mouthpiece in your mouth. Make sure your lips are closed tightly around it. Breathe in slowly and as deeply as you can through your mouth, causing the piston or the ball to rise toward the top of the chamber. Hold your breath for 3-5 seconds, or for as long as possible. If the spirometer includes a coach indicator, use this to guide you in breathing. Slow down your breathing if the indicator goes above the marked areas. Remove the mouthpiece from your mouth and breathe out normally. The piston or ball will return to the bottom of the chamber. Rest for a few seconds, then repeat the steps 10 or more times. Take your time and take a few normal breaths between deep breaths so that you do not get dizzy or light-headed. Do this every 1-2 hours when you are awake. If the spirometer includes a goal marker to show the highest number you have reached (best effort), use this as a goal to work toward during each repetition. After each set of 10 deep breaths, cough a few times. This will help to make sure that your lungs are clear. If you have an incision on your chest or abdomen from surgery, place a pillow or a rolled-up towel firmly against the incision when you cough. This can help to reduce pain while taking deep breaths and coughing. General tips When you are able to get out of bed: Walk around often. Continue to take deep breaths and cough in order to clear your lungs. Keep using the incentive spirometer until your health care provider says it is okay to stop using it. If you have been in the hospital, you may be told to keep using the spirometer at home. Contact a health care provider if: You are having difficulty using the spirometer. You have trouble using the spirometer as  often as instructed. Your pain medicine is not  giving enough relief for you to use the spirometer as told. You have a fever. Get help right away if: You develop shortness of breath. You develop a cough with bloody mucus from the lungs. You have fluid or blood coming from an incision site after you cough. Summary An incentive spirometer is a tool that can help you learn to take long, deep breaths to keep your lungs clear and active. You may be asked to use a spirometer after a surgery, if you have a lung problem or a history of smoking, or if you have been inactive for a long period of time. Use your incentive spirometer as instructed every 1-2 hours while you are awake. If you have an incision on your chest or abdomen, place a pillow or a rolled-up towel firmly against your incision when you cough. This will help to reduce pain. Get help right away if you have shortness of breath, you cough up bloody mucus, or blood comes from your incision when you cough. This information is not intended to replace advice given to you by your health care provider. Make sure you discuss any questions you have with your health care provider. Document Revised: 04/27/2019 Document Reviewed: 04/27/2019 Elsevier Patient Education  2023 [employer].

## 2023-05-29 ENCOUNTER — Encounter: Payer: Self-pay | Admitting: Urgent Care

## 2023-06-03 ENCOUNTER — Other Ambulatory Visit
Admission: RE | Admit: 2023-06-03 | Discharge: 2023-06-03 | Disposition: A | Discharge status: Home / Self Care | Source: Ambulatory Visit | Attending: Student | Admitting: Student

## 2023-06-03 DIAGNOSIS — I517 Cardiomegaly: Secondary | ICD-10-CM | POA: Insufficient documentation

## 2023-06-03 DIAGNOSIS — R635 Abnormal weight gain: Secondary | ICD-10-CM | POA: Diagnosis present

## 2023-06-03 DIAGNOSIS — R059 Cough, unspecified: Secondary | ICD-10-CM | POA: Insufficient documentation

## 2023-06-03 DIAGNOSIS — R062 Wheezing: Secondary | ICD-10-CM | POA: Diagnosis not present

## 2023-06-03 LAB — BRAIN NATRIURETIC PEPTIDE: B Natriuretic Peptide: 11.3 pg/mL (ref 0.0–100.0)

## 2023-06-04 ENCOUNTER — Encounter: Admission: RE | Payer: Self-pay | Source: Home / Self Care

## 2023-06-04 ENCOUNTER — Ambulatory Visit: Admission: RE | Admit: 2023-06-04 | Source: Home / Self Care | Admitting: Orthopedic Surgery

## 2023-06-04 SURGERY — ARTHROPLASTY, SHOULDER, TOTAL, REVERSE
Anesthesia: Choice | Site: Shoulder | Laterality: Right

## 2023-06-06 ENCOUNTER — Ambulatory Visit: Admitting: Nurse Practitioner

## 2023-06-19 ENCOUNTER — Ambulatory Visit (INDEPENDENT_AMBULATORY_CARE_PROVIDER_SITE_OTHER): Payer: 59 | Admitting: Internal Medicine

## 2023-06-19 DIAGNOSIS — J449 Chronic obstructive pulmonary disease, unspecified: Secondary | ICD-10-CM | POA: Diagnosis not present

## 2023-07-02 ENCOUNTER — Ambulatory Visit (INDEPENDENT_AMBULATORY_CARE_PROVIDER_SITE_OTHER): Admitting: Nurse Practitioner

## 2023-07-02 ENCOUNTER — Encounter: Payer: Self-pay | Admitting: Nurse Practitioner

## 2023-07-02 ENCOUNTER — Telehealth: Payer: Self-pay

## 2023-07-02 VITALS — BP 132/86 | HR 90 | Temp 98.2°F | Resp 16 | Ht 60.0 in | Wt 189.4 lb

## 2023-07-02 DIAGNOSIS — G4700 Insomnia, unspecified: Secondary | ICD-10-CM

## 2023-07-02 DIAGNOSIS — R053 Chronic cough: Secondary | ICD-10-CM

## 2023-07-02 DIAGNOSIS — J449 Chronic obstructive pulmonary disease, unspecified: Secondary | ICD-10-CM

## 2023-07-02 DIAGNOSIS — Z79899 Other long term (current) drug therapy: Secondary | ICD-10-CM | POA: Diagnosis not present

## 2023-07-02 MED ORDER — PROMETHAZINE-DM 6.25-15 MG/5ML PO SYRP: 5.0000 mL | ORAL_SOLUTION | Freq: Four times a day (QID) | ORAL | 1 refills | Status: DC | PRN

## 2023-07-02 MED ORDER — SPIRONOLACTONE 25 MG PO TABS: 25.0000 mg | ORAL_TABLET | Freq: Every day | ORAL | 1 refills | Status: DC

## 2023-07-02 MED ORDER — BREZTRI AEROSPHERE 160-9-4.8 MCG/ACT IN AERO: 2.0000 | INHALATION_SPRAY | Freq: Two times a day (BID) | RESPIRATORY_TRACT | 3 refills | Status: DC

## 2023-07-02 MED ORDER — ESZOPICLONE 3 MG PO TABS: 3.0000 mg | ORAL_TABLET | Freq: Every day | ORAL | 2 refills | Status: DC

## 2023-07-02 MED ORDER — IPRATROPIUM-ALBUTEROL 0.5-2.5 (3) MG/3ML IN SOLN: 3.0000 mL | Freq: Four times a day (QID) | RESPIRATORY_TRACT | 3 refills | Status: DC | PRN
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
## 2023-07-02 NOTE — Telephone Encounter (Signed)
 Send message to [employer] for nebulizer

## 2023-07-04 ENCOUNTER — Telehealth: Payer: Self-pay

## 2023-07-04 ENCOUNTER — Encounter: Payer: Self-pay | Admitting: Nurse Practitioner

## 2023-07-04 NOTE — Telephone Encounter (Signed)
 Pt called that after she used Duoneb she was having heavy chest and feels funny talk and shaking feeling as per alyssa advised her to stopped duoneb and go to ED as per pt she is feeling better now as per alyssa advised her not to used nebulizer and continue inhaler and if her symptoms getting worse go to ED

## 2023-07-05 ENCOUNTER — Other Ambulatory Visit: Payer: Self-pay | Admitting: Neurosurgery

## 2023-07-10 ENCOUNTER — Telehealth: Payer: Self-pay | Admitting: Nurse Practitioner

## 2023-07-10 NOTE — Telephone Encounter (Signed)
 Received 07/03/23 O2 CMN from [facility]. Gave to Alyssa for signature-Toni

## 2023-07-11 ENCOUNTER — Telehealth: Payer: Self-pay | Admitting: Nurse Practitioner

## 2023-07-11 ENCOUNTER — Ambulatory Visit: Payer: 59 | Admitting: Nurse Practitioner

## 2023-07-11 ENCOUNTER — Other Ambulatory Visit: Payer: Self-pay | Admitting: Orthopedic Surgery

## 2023-07-12 ENCOUNTER — Telehealth: Payer: Self-pay

## 2023-07-13 ENCOUNTER — Encounter: Payer: Self-pay | Admitting: Nurse Practitioner

## 2023-07-14 ENCOUNTER — Encounter: Payer: Self-pay | Admitting: Nurse Practitioner

## 2023-07-14 ENCOUNTER — Other Ambulatory Visit: Payer: Self-pay | Admitting: Nurse Practitioner

## 2023-07-14 MED ORDER — BUSPIRONE HCL 5 MG PO TABS: 5.0000 mg | ORAL_TABLET | Freq: Two times a day (BID) | ORAL | 2 refills | Status: DC

## 2023-07-16 ENCOUNTER — Telehealth: Payer: Self-pay | Admitting: Nurse Practitioner

## 2023-07-16 NOTE — Telephone Encounter (Signed)
 Nebulizer order signed. Faxed back to [facility]; [phone]. Scanned-Toni

## 2023-07-18 NOTE — Patient Instructions (Signed)
 Your procedure is scheduled on: Report to the Registration Desk on the 1st floor of the [facility]. To find out your arrival time, please call [phone] between 1PM - 3PM on: If your arrival time is 6:00 am, do not arrive before that time as the [facility] entrance doors do not open until 6:00 am.  REMEMBER: Instructions that are not followed completely may result in serious medical risk, up to and including death; or upon the discretion of your surgeon and anesthesiologist your surgery may need to be rescheduled.  Do not eat food after midnight the night before surgery.  No gum chewing or hard candies.  You may however, drink CLEAR liquids up to 2 hours before you are scheduled to arrive for your surgery. Do not drink anything within 2 hours of your scheduled arrival time.  Clear liquids include: - water  - apple juice without pulp - gatorade (not RED colors) - black coffee or tea (Do NOT add milk or creamers to the coffee or tea) Do NOT drink anything that is not on this list.  **Type 1 and Type 2 diabetics should only drink water.**  In addition, your doctor has ordered for you to drink the provided:  Ensure Pre-Surgery Clear Carbohydrate Drink  Gatorade G2 Drinking this carbohydrate drink up to two hours before surgery helps to reduce insulin resistance and improve patient outcomes. Please complete drinking 2 hours before scheduled arrival time.  One week prior to surgery: Stop Anti-inflammatories (NSAIDS) such as Advil, Aleve, Ibuprofen, Motrin, Naproxen, Naprosyn and Aspirin  based products such as Excedrin , Goody's Powder, BC Powder. Stop ANY OVER THE COUNTER supplements until after surgery.  You may however, continue to take Tylenol  if needed for pain up until the day of surgery.  **Follow guidelines for insulin and diabetes medications.**  **Follow recommendations regarding stopping blood thinners.**  Continue taking all of your other prescription medications up  until the day of surgery.  ON THE DAY OF SURGERY ONLY TAKE THESE MEDICATIONS WITH SIPS OF WATER:    Use inhalers on the day of surgery and bring to the hospital.  Fleets enema or bowel prep as directed.  No Alcohol for 24 hours before or after surgery.  No Smoking including e-cigarettes for 24 hours before surgery.  No chewable tobacco products for at least 6 hours before surgery.  No nicotine patches on the day of surgery.  Do not use any "recreational" drugs for at least a week (preferably 2 weeks) before your surgery.  Please be advised that the combination of cocaine and anesthesia may have negative outcomes, up to and including death. If you test positive for cocaine, your surgery will be cancelled.  On the morning of surgery brush your teeth with toothpaste and water, you may rinse your mouth with mouthwash if you wish. Do not swallow any toothpaste or mouthwash.  Use CHG Soap or wipes as directed on instruction sheet.  Do not wear jewelry, make-up, hairpins, clips or nail polish.  For welded (permanent) jewelry: bracelets, anklets, waist bands, etc.  Please have this removed prior to surgery.  If it is not removed, there is a chance that hospital personnel will need to cut it off on the day of surgery.  Do not wear lotions, powders, or perfumes.   Do not shave body hair from the neck down 48 hours before surgery.  Contact lenses, hearing aids and dentures may not be worn into surgery.  Do not bring valuables to the hospital. [facility]  is not responsible for any missing/lost belongings or valuables.   Total Shoulder Arthroplasty:  use Benzoyl Peroxide 5% Gel as directed on instruction sheet.  Bring your C-PAP to the hospital in case you may have to spend the night.   Notify your doctor if there is any change in your medical condition (cold, fever, infection).  Wear comfortable clothing (specific to your surgery type) to the hospital.  After surgery, you can help  prevent lung complications by doing breathing exercises.  Take deep breaths and cough every 1-2 hours. Your doctor may order a device called an Incentive Spirometer to help you take deep breaths. When coughing or sneezing, hold a pillow firmly against your incision with both hands. This is called "splinting." Doing this helps protect your incision. It also decreases belly discomfort.  If you are being admitted to the hospital overnight, leave your suitcase in the car. After surgery it may be brought to your room.  In case of increased patient census, it may be necessary for you, the patient, to continue your postoperative care in the Same Day Surgery department.  If you are being discharged the day of surgery, you will not be allowed to drive home. You will need a responsible individual to drive you home and stay with you for 24 hours after surgery.   If you are taking public transportation, you will need to have a responsible individual with you.  Please call the Pre-admissions Testing Dept. at [phone] if you have any questions about these instructions.  Surgery Visitation Policy:  Patients having surgery or a procedure may have two visitors.  Children under the age of 73 must have an adult with them who is not the patient.  Inpatient Visitation:    Visiting hours are 7 a.m. to 8 p.m. Up to four visitors are allowed at one time in a patient room. The visitors may rotate out with other people during the day.  One visitor age 43 or older may stay with the patient overnight and must be in the room by 8 p.m.

## 2023-07-19 ENCOUNTER — Encounter: Payer: Self-pay | Admitting: Orthopedic Surgery

## 2023-07-19 ENCOUNTER — Other Ambulatory Visit: Payer: Self-pay

## 2023-07-19 ENCOUNTER — Encounter
Admission: RE | Admit: 2023-07-19 | Discharge: 2023-07-19 | Disposition: A | Discharge status: Home / Self Care | Source: Ambulatory Visit | Attending: Orthopedic Surgery | Admitting: Orthopedic Surgery

## 2023-07-19 VITALS — BP 144/85 | HR 98 | Resp 14 | Ht 60.0 in | Wt 192.0 lb

## 2023-07-19 DIAGNOSIS — Z01812 Encounter for preprocedural laboratory examination: Secondary | ICD-10-CM | POA: Diagnosis not present

## 2023-07-19 DIAGNOSIS — Z01818 Encounter for other preprocedural examination: Secondary | ICD-10-CM | POA: Diagnosis present

## 2023-07-19 DIAGNOSIS — Z79899 Other long term (current) drug therapy: Secondary | ICD-10-CM | POA: Diagnosis not present

## 2023-07-19 LAB — BASIC METABOLIC PANEL WITH GFR
Anion gap: 12 (ref 5–15)
BUN: 25 mg/dL — ABNORMAL HIGH (ref 8–23)
CO2: 24 mmol/L (ref 22–32)
Calcium: 9.3 mg/dL (ref 8.9–10.3)
Chloride: 101 mmol/L (ref 98–111)
Creatinine, Ser: 0.81 mg/dL (ref 0.44–1.00)
GFR, Estimated: >60 mL/min (ref >60)
Glucose, Bld: 105 mg/dL — ABNORMAL HIGH (ref 70–99)
Potassium: 3.9 mmol/L (ref 3.5–5.1)
Sodium: 137 mmol/L (ref 135–145)

## 2023-07-19 LAB — SURGICAL PCR SCREEN
MRSA, PCR: NEGATIVE
Staphylococcus aureus: NEGATIVE

## 2023-07-19 NOTE — Pre-Procedure Instructions (Signed)
 No UA today. Previously done 05/2023. Pt denies any itching burning blood or pain with urination. Ok'd per Allegra Isles NP
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
## 2023-07-22 NOTE — Progress Notes (Signed)
 Received cardiac clearance from [facility] on 6-2 and placed on chart. Low Risk

## 2023-07-23 ENCOUNTER — Encounter
Admission: RE | Disposition: A | Discharge status: Home / Self Care | Payer: Self-pay | Source: Home / Self Care | Attending: Orthopedic Surgery

## 2023-07-23 ENCOUNTER — Other Ambulatory Visit: Payer: Self-pay

## 2023-07-23 ENCOUNTER — Ambulatory Visit

## 2023-07-23 ENCOUNTER — Ambulatory Visit: Payer: Self-pay | Admitting: Urgent Care

## 2023-07-23 ENCOUNTER — Ambulatory Visit
Admission: RE | Admit: 2023-07-23 | Discharge: 2023-07-24 | Disposition: A | Discharge status: Home / Self Care | Attending: Orthopedic Surgery | Admitting: Orthopedic Surgery

## 2023-07-23 ENCOUNTER — Encounter: Payer: Self-pay | Admitting: Orthopedic Surgery

## 2023-07-23 DIAGNOSIS — I5032 Chronic diastolic (congestive) heart failure: Secondary | ICD-10-CM | POA: Insufficient documentation

## 2023-07-23 DIAGNOSIS — M961 Postlaminectomy syndrome, not elsewhere classified: Secondary | ICD-10-CM | POA: Insufficient documentation

## 2023-07-23 DIAGNOSIS — Z9884 Bariatric surgery status: Secondary | ICD-10-CM | POA: Insufficient documentation

## 2023-07-23 DIAGNOSIS — I272 Pulmonary hypertension, unspecified: Secondary | ICD-10-CM | POA: Insufficient documentation

## 2023-07-23 DIAGNOSIS — M19011 Primary osteoarthritis, right shoulder: Secondary | ICD-10-CM | POA: Diagnosis present

## 2023-07-23 DIAGNOSIS — I110 Hypertensive heart disease with heart failure: Secondary | ICD-10-CM | POA: Insufficient documentation

## 2023-07-23 DIAGNOSIS — M797 Fibromyalgia: Secondary | ICD-10-CM | POA: Diagnosis not present

## 2023-07-23 DIAGNOSIS — J4489 Other specified chronic obstructive pulmonary disease: Secondary | ICD-10-CM | POA: Insufficient documentation

## 2023-07-23 DIAGNOSIS — Z01818 Encounter for other preprocedural examination: Secondary | ICD-10-CM

## 2023-07-23 DIAGNOSIS — G629 Polyneuropathy, unspecified: Secondary | ICD-10-CM | POA: Insufficient documentation

## 2023-07-23 DIAGNOSIS — E875 Hyperkalemia: Secondary | ICD-10-CM

## 2023-07-23 DIAGNOSIS — G894 Chronic pain syndrome: Secondary | ICD-10-CM | POA: Diagnosis not present

## 2023-07-23 DIAGNOSIS — G4733 Obstructive sleep apnea (adult) (pediatric): Secondary | ICD-10-CM | POA: Insufficient documentation

## 2023-07-23 HISTORY — DX: Restless legs syndrome: G25.81

## 2023-07-23 HISTORY — PX: BICEPT TENODESIS: SHX5116

## 2023-07-23 HISTORY — PX: REVERSE SHOULDER ARTHROPLASTY: SHX5054

## 2023-07-23 HISTORY — DX: Prediabetes: R73.03

## 2023-07-23 HISTORY — DX: Unspecified diastolic (congestive) heart failure: I50.30

## 2023-07-23 SURGERY — ARTHROPLASTY, SHOULDER, TOTAL, REVERSE
Anesthesia: General | Site: Shoulder | Laterality: Right

## 2023-07-23 MED ORDER — LACTATED RINGERS IV SOLN: INTRAVENOUS | Status: DC

## 2023-07-23 MED ORDER — LINACLOTIDE 145 MCG PO CAPS
290.0000 ug | ORAL_CAPSULE | Freq: Every day | ORAL | Status: DC
  Filled 2023-07-23 (×2): qty 2

## 2023-07-23 MED ORDER — MIDAZOLAM HCL 2 MG/2ML IJ SOLN
INTRAMUSCULAR | Status: AC
  Filled 2023-07-23: qty 2

## 2023-07-23 MED ORDER — HYDROMORPHONE HCL 1 MG/ML IJ SOLN: 0.2000 mg | INTRAMUSCULAR | Status: DC | PRN

## 2023-07-23 MED ORDER — PROPOFOL 10 MG/ML IV BOLUS
INTRAVENOUS | Status: AC
Start: 2023-07-23 — End: ?
  Filled 2023-07-23: qty 20

## 2023-07-23 MED ORDER — MENTHOL 3 MG MT LOZG: 1.0000 | LOZENGE | OROMUCOSAL | Status: DC | PRN

## 2023-07-23 MED ORDER — ROCURONIUM BROMIDE 10 MG/ML (PF) SYRINGE
PREFILLED_SYRINGE | INTRAVENOUS | Status: AC
Start: 2023-07-23 — End: ?
  Filled 2023-07-23: qty 10

## 2023-07-23 MED ORDER — 0.9 % SODIUM CHLORIDE (POUR BTL) OPTIME
TOPICAL | Status: DC | PRN
  Administered 2023-07-23: 500 mL

## 2023-07-23 MED ORDER — CEFAZOLIN SODIUM-DEXTROSE 2-4 GM/100ML-% IV SOLN
2.0000 g | INTRAVENOUS | Status: AC
  Administered 2023-07-23: 2 g via INTRAVENOUS

## 2023-07-23 MED ORDER — FENTANYL CITRATE (PF) 100 MCG/2ML IJ SOLN
INTRAMUSCULAR | Status: AC
  Filled 2023-07-23: qty 2

## 2023-07-23 MED ORDER — DOCUSATE SODIUM 100 MG PO CAPS
100.0000 mg | ORAL_CAPSULE | Freq: Two times a day (BID) | ORAL | Status: DC
Start: 2023-07-23 — End: 2023-07-24
  Administered 2023-07-23: 100 mg via ORAL
  Filled 2023-07-23 (×2): qty 1

## 2023-07-23 MED ORDER — ALBUTEROL SULFATE (2.5 MG/3ML) 0.083% IN NEBU: 2.5000 mg | INHALATION_SOLUTION | Freq: Four times a day (QID) | RESPIRATORY_TRACT | Status: DC | PRN

## 2023-07-23 MED ORDER — CALCIUM CITRATE 950 (200 CA) MG PO TABS
1.0000 | ORAL_TABLET | Freq: Every day | ORAL | Status: DC
  Administered 2023-07-23: 950 mg via ORAL
  Filled 2023-07-23 (×2): qty 1

## 2023-07-23 MED ORDER — LIDOCAINE HCL (CARDIAC) PF 100 MG/5ML IV SOSY
PREFILLED_SYRINGE | INTRAVENOUS | Status: DC | PRN
  Administered 2023-07-23: 60 mg via INTRAVENOUS

## 2023-07-23 MED ORDER — BISACODYL 10 MG RE SUPP
10.0000 mg | Freq: Every day | RECTAL | Status: DC | PRN
Start: 2023-07-23 — End: 2023-07-24

## 2023-07-23 MED ORDER — PROPOFOL 10 MG/ML IV BOLUS
INTRAVENOUS | Status: DC | PRN
Start: 2023-07-23 — End: 2023-07-23
  Administered 2023-07-23: 100 mg via INTRAVENOUS

## 2023-07-23 MED ORDER — POTASSIUM CHLORIDE CRYS ER 20 MEQ PO TBCR
20.0000 meq | EXTENDED_RELEASE_TABLET | Freq: Two times a day (BID) | ORAL | Status: DC
  Administered 2023-07-23: 20 meq via ORAL
  Filled 2023-07-23 (×5): qty 1

## 2023-07-23 MED ORDER — OXYCODONE HCL 5 MG PO TABS: 5.0000 mg | ORAL_TABLET | Freq: Once | ORAL | Status: DC | PRN

## 2023-07-23 MED ORDER — ASPIRIN 325 MG PO TBEC: 325.0000 mg | DELAYED_RELEASE_TABLET | Freq: Every day | ORAL | Status: DC

## 2023-07-23 MED ORDER — METOCLOPRAMIDE HCL 5 MG/ML IJ SOLN
5.0000 mg | Freq: Three times a day (TID) | INTRAMUSCULAR | Status: DC | PRN
  Administered 2023-07-23 – 2023-07-24 (×2): 10 mg via INTRAVENOUS
  Filled 2023-07-23 (×2): qty 2

## 2023-07-23 MED ORDER — ROCURONIUM BROMIDE 100 MG/10ML IV SOLN
INTRAVENOUS | Status: DC | PRN
  Administered 2023-07-23 (×2): 50 mg via INTRAVENOUS

## 2023-07-23 MED ORDER — PHENOL 1.4 % MT LIQD: 1.0000 | OROMUCOSAL | Status: DC | PRN

## 2023-07-23 MED ORDER — BUPIVACAINE HCL (PF) 0.5 % IJ SOLN
INTRAMUSCULAR | Status: AC
  Filled 2023-07-23: qty 10

## 2023-07-23 MED ORDER — OXYCODONE HCL 5 MG PO TABS
ORAL_TABLET | ORAL | Status: AC
Start: 2023-07-23 — End: ?
  Filled 2023-07-23: qty 2

## 2023-07-23 MED ORDER — ADULT MULTIVITAMIN LIQUID CH
Freq: Every day | ORAL | Status: DC
  Administered 2023-07-23: 15 mL via ORAL
  Filled 2023-07-23 (×2): qty 15

## 2023-07-23 MED ORDER — SENNOSIDES-DOCUSATE SODIUM 8.6-50 MG PO TABS
1.0000 | ORAL_TABLET | Freq: Every evening | ORAL | Status: DC | PRN
Start: 2023-07-23 — End: 2023-07-24

## 2023-07-23 MED ORDER — FENTANYL CITRATE (PF) 100 MCG/2ML IJ SOLN
25.0000 ug | INTRAMUSCULAR | Status: DC | PRN
  Administered 2023-07-23 (×2): 50 ug via INTRAVENOUS

## 2023-07-23 MED ORDER — CEFAZOLIN SODIUM-DEXTROSE 2-4 GM/100ML-% IV SOLN
INTRAVENOUS | Status: AC
  Filled 2023-07-23: qty 100

## 2023-07-23 MED ORDER — FENTANYL CITRATE (PF) 100 MCG/2ML IJ SOLN
INTRAMUSCULAR | Status: DC | PRN
  Administered 2023-07-23: 25 ug via INTRAVENOUS
  Administered 2023-07-23: 50 ug via INTRAVENOUS
  Administered 2023-07-23: 25 ug via INTRAVENOUS

## 2023-07-23 MED ORDER — PANTOPRAZOLE SODIUM 40 MG PO TBEC
40.0000 mg | DELAYED_RELEASE_TABLET | Freq: Two times a day (BID) | ORAL | Status: DC
  Administered 2023-07-23 – 2023-07-24 (×2): 40 mg via ORAL
  Filled 2023-07-23 (×6): qty 1

## 2023-07-23 MED ORDER — CHLORHEXIDINE GLUCONATE 0.12 % MT SOLN
OROMUCOSAL | Status: AC
  Filled 2023-07-23: qty 15

## 2023-07-23 MED ORDER — OXYCODONE HCL 5 MG/5ML PO SOLN: 5.0000 mg | Freq: Once | ORAL | Status: DC | PRN

## 2023-07-23 MED ORDER — LEVOTHYROXINE SODIUM 25 MCG PO TABS
100.0000 ug | ORAL_TABLET | Freq: Every day | ORAL | Status: DC
  Administered 2023-07-24: 100 ug via ORAL
  Filled 2023-07-23: qty 1
  Filled 2023-07-23: qty 4
  Filled 2023-07-23: qty 1
  Filled 2023-07-23: qty 4

## 2023-07-23 MED ORDER — MIDAZOLAM HCL 2 MG/2ML IJ SOLN
INTRAMUSCULAR | Status: DC | PRN
  Administered 2023-07-23: 2 mg via INTRAVENOUS

## 2023-07-23 MED ORDER — VANCOMYCIN HCL 1000 MG IV SOLR
INTRAVENOUS | Status: DC | PRN
  Administered 2023-07-23: 1000 mg via TOPICAL

## 2023-07-23 MED ORDER — BUSPIRONE HCL 5 MG PO TABS
5.0000 mg | ORAL_TABLET | Freq: Two times a day (BID) | ORAL | Status: DC
  Administered 2023-07-23 – 2023-07-24 (×2): 5 mg via ORAL
  Filled 2023-07-23 (×2): qty 1

## 2023-07-23 MED ORDER — ONDANSETRON HCL 4 MG/2ML IJ SOLN
INTRAMUSCULAR | Status: DC | PRN
Start: 2023-07-23 — End: 2023-07-23
  Administered 2023-07-23: 4 mg via INTRAVENOUS

## 2023-07-23 MED ORDER — VANCOMYCIN HCL 1000 MG IV SOLR
INTRAVENOUS | Status: AC
  Filled 2023-07-23: qty 20

## 2023-07-23 MED ORDER — BUMETANIDE 1 MG PO TABS
2.0000 mg | ORAL_TABLET | Freq: Every day | ORAL | Status: DC
  Administered 2023-07-24: 2 mg via ORAL
  Filled 2023-07-23 (×2): qty 2

## 2023-07-23 MED ORDER — DROPERIDOL 2.5 MG/ML IJ SOLN: 0.6250 mg | Freq: Once | INTRAMUSCULAR | Status: DC | PRN

## 2023-07-23 MED ORDER — TRANEXAMIC ACID-NACL 1000-0.7 MG/100ML-% IV SOLN
INTRAVENOUS | Status: AC
Start: 2023-07-23 — End: ?
  Filled 2023-07-23: qty 100

## 2023-07-23 MED ORDER — ZOLPIDEM TARTRATE 5 MG PO TABS
5.0000 mg | ORAL_TABLET | Freq: Every evening | ORAL | Status: DC | PRN
  Administered 2023-07-23: 5 mg via ORAL
  Filled 2023-07-23: qty 1

## 2023-07-23 MED ORDER — OXYCODONE HCL 5 MG PO TABS: 10.0000 mg | ORAL_TABLET | ORAL | Status: DC | PRN

## 2023-07-23 MED ORDER — TRANEXAMIC ACID-NACL 1000-0.7 MG/100ML-% IV SOLN
1000.0000 mg | Freq: Once | INTRAVENOUS | Status: AC
  Administered 2023-07-23: 1000 mg via INTRAVENOUS

## 2023-07-23 MED ORDER — ORAL CARE MOUTH RINSE
15.0000 mL | Freq: Once | OROMUCOSAL | Status: AC
Start: 2023-07-23 — End: 2023-07-23

## 2023-07-23 MED ORDER — LIDOCAINE HCL (PF) 2 % IJ SOLN
INTRAMUSCULAR | Status: AC
  Filled 2023-07-23: qty 5

## 2023-07-23 MED ORDER — ALBUTEROL SULFATE (2.5 MG/3ML) 0.083% IN NEBU
2.5000 mg | INHALATION_SOLUTION | Freq: Once | RESPIRATORY_TRACT | Status: AC
  Administered 2023-07-23: 2.5 mg via RESPIRATORY_TRACT

## 2023-07-23 MED ORDER — SPIRONOLACTONE 25 MG PO TABS
25.0000 mg | ORAL_TABLET | Freq: Every day | ORAL | Status: DC
  Filled 2023-07-23: qty 1

## 2023-07-23 MED ORDER — TRANEXAMIC ACID-NACL 1000-0.7 MG/100ML-% IV SOLN
1000.0000 mg | INTRAVENOUS | Status: AC
  Administered 2023-07-23: 1000 mg via INTRAVENOUS

## 2023-07-23 MED ORDER — DEXAMETHASONE SODIUM PHOSPHATE 10 MG/ML IJ SOLN
INTRAMUSCULAR | Status: AC
  Filled 2023-07-23: qty 1

## 2023-07-23 MED ORDER — DEXAMETHASONE SODIUM PHOSPHATE 10 MG/ML IJ SOLN
INTRAMUSCULAR | Status: DC | PRN
  Administered 2023-07-23: 5 mg via INTRAVENOUS

## 2023-07-23 MED ORDER — ONDANSETRON HCL 4 MG/2ML IJ SOLN
4.0000 mg | Freq: Four times a day (QID) | INTRAMUSCULAR | Status: DC | PRN
Start: 2023-07-23 — End: 2023-07-24
  Administered 2023-07-23 – 2023-07-24 (×2): 4 mg via INTRAVENOUS
  Filled 2023-07-23 (×2): qty 2

## 2023-07-23 MED ORDER — BUPIVACAINE LIPOSOME 1.3 % IJ SUSP
INTRAMUSCULAR | Status: DC | PRN
  Administered 2023-07-23: 10 mL via PERINEURAL

## 2023-07-23 MED ORDER — ALUM & MAG HYDROXIDE-SIMETH 200-200-20 MG/5ML PO SUSP: 30.0000 mL | ORAL | Status: DC | PRN

## 2023-07-23 MED ORDER — SODIUM CHLORIDE 0.9 % IR SOLN
Status: DC | PRN
  Administered 2023-07-23: 3000 mL

## 2023-07-23 MED ORDER — CYCLOBENZAPRINE HCL 10 MG PO TABS
10.0000 mg | ORAL_TABLET | Freq: Three times a day (TID) | ORAL | Status: DC | PRN
  Administered 2023-07-23 – 2023-07-24 (×2): 10 mg via ORAL
  Filled 2023-07-23 (×2): qty 1

## 2023-07-23 MED ORDER — BUPIVACAINE HCL (PF) 0.5 % IJ SOLN
INTRAMUSCULAR | Status: DC | PRN
  Administered 2023-07-23: 10 mL via PERINEURAL

## 2023-07-23 MED ORDER — BUDESON-GLYCOPYRROL-FORMOTEROL 160-9-4.8 MCG/ACT IN AERO
2.0000 | INHALATION_SPRAY | Freq: Two times a day (BID) | RESPIRATORY_TRACT | Status: DC
  Administered 2023-07-24: 2 via RESPIRATORY_TRACT
  Filled 2023-07-23: qty 5.9

## 2023-07-23 MED ORDER — ASPIRIN 325 MG PO TBEC
325.0000 mg | DELAYED_RELEASE_TABLET | Freq: Every day | ORAL | Status: DC
  Administered 2023-07-24: 325 mg via ORAL
  Filled 2023-07-23: qty 1

## 2023-07-23 MED ORDER — GABAPENTIN 300 MG PO CAPS
300.0000 mg | ORAL_CAPSULE | Freq: Three times a day (TID) | ORAL | Status: DC
  Administered 2023-07-23 – 2023-07-24 (×3): 300 mg via ORAL
  Filled 2023-07-23 (×6): qty 1
  Filled 2023-07-23: qty 3
  Filled 2023-07-23 (×2): qty 1

## 2023-07-23 MED ORDER — OXYCODONE HCL 5 MG PO TABS
5.0000 mg | ORAL_TABLET | ORAL | Status: DC | PRN
  Administered 2023-07-23 – 2023-07-24 (×5): 10 mg via ORAL
  Filled 2023-07-23 (×4): qty 2

## 2023-07-23 MED ORDER — ACETAMINOPHEN 500 MG PO TABS
1000.0000 mg | ORAL_TABLET | Freq: Three times a day (TID) | ORAL | Status: DC
  Administered 2023-07-23 – 2023-07-24 (×2): 1000 mg via ORAL
  Filled 2023-07-23 (×2): qty 2

## 2023-07-23 MED ORDER — ALBUTEROL SULFATE (2.5 MG/3ML) 0.083% IN NEBU
INHALATION_SOLUTION | RESPIRATORY_TRACT | Status: AC
  Filled 2023-07-23: qty 3

## 2023-07-23 MED ORDER — BUPIVACAINE LIPOSOME 1.3 % IJ SUSP
INTRAMUSCULAR | Status: AC
  Filled 2023-07-23: qty 10

## 2023-07-23 MED ORDER — ACETAMINOPHEN 10 MG/ML IV SOLN
1000.0000 mg | Freq: Once | INTRAVENOUS | Status: DC | PRN
Start: 2023-07-23 — End: 2023-07-23
  Administered 2023-07-23: 1000 mg via INTRAVENOUS

## 2023-07-23 MED ORDER — METOCLOPRAMIDE HCL 5 MG PO TABS: 5.0000 mg | ORAL_TABLET | Freq: Three times a day (TID) | ORAL | Status: DC | PRN

## 2023-07-23 MED ORDER — ONDANSETRON HCL 4 MG PO TABS: 4.0000 mg | ORAL_TABLET | Freq: Four times a day (QID) | ORAL | Status: DC | PRN

## 2023-07-23 MED ORDER — ACETAMINOPHEN 10 MG/ML IV SOLN
INTRAVENOUS | Status: AC
  Filled 2023-07-23: qty 100

## 2023-07-23 MED ORDER — CHLORHEXIDINE GLUCONATE 0.12 % MT SOLN
15.0000 mL | Freq: Once | OROMUCOSAL | Status: AC
Start: 2023-07-23 — End: 2023-07-23
  Administered 2023-07-23: 15 mL via OROMUCOSAL

## 2023-07-23 MED ORDER — CEFAZOLIN SODIUM-DEXTROSE 2-4 GM/100ML-% IV SOLN
2.0000 g | Freq: Four times a day (QID) | INTRAVENOUS | Status: AC
  Administered 2023-07-23 – 2023-07-24 (×3): 2 g via INTRAVENOUS
  Filled 2023-07-23 (×3): qty 100

## 2023-07-23 MED ORDER — ONDANSETRON HCL 4 MG/2ML IJ SOLN
INTRAMUSCULAR | Status: AC
  Filled 2023-07-23: qty 2

## 2023-07-23 MED ORDER — SUGAMMADEX SODIUM 200 MG/2ML IV SOLN
INTRAVENOUS | Status: DC | PRN
  Administered 2023-07-23: 200 mg via INTRAVENOUS

## 2023-07-23 SURGICAL SUPPLY — 68 items
BASEPLATE GLEN ALTIVATE 6.5 (Joint) IMPLANT
BIT DRILL 12.7X2STRG SHNK (BIT) IMPLANT
BLADE SAGITTAL WIDE XTHICK NO (BLADE) ×1 IMPLANT
CHLORAPREP W/TINT 26 (MISCELLANEOUS) ×1 IMPLANT
COOLER POLAR GLACIER W/PUMP (MISCELLANEOUS) ×1 IMPLANT
DERMABOND ADVANCED .7 DNX12 (GAUZE/BANDAGES/DRESSINGS) IMPLANT
DRAPE INCISE IOBAN 66X45 STRL (DRAPES) ×1 IMPLANT
DRAPE SHEET LG 3/4 BI-LAMINATE (DRAPES) ×1 IMPLANT
DRAPE TABLE BACK 80X90 (DRAPES) ×1 IMPLANT
DRILL GLEN ALTIVATE 3.5 (DRILL) IMPLANT
DRSG OPSITE POSTOP 3X4 (GAUZE/BANDAGES/DRESSINGS) IMPLANT
DRSG OPSITE POSTOP 4X8 (GAUZE/BANDAGES/DRESSINGS) ×1 IMPLANT
DRSG TEGADERM 2-3/8X2-3/4 SM (GAUZE/BANDAGES/DRESSINGS) IMPLANT
ELECTRODE REM PT RTRN 9FT ADLT (ELECTROSURGICAL) ×1 IMPLANT
EVACUATOR 1/8 PVC DRAIN (DRAIN) ×1 IMPLANT
GAUZE SPONGE 2X2 STRL 8-PLY (GAUZE/BANDAGES/DRESSINGS) ×1 IMPLANT
GAUZE XEROFORM 1X8 LF (GAUZE/BANDAGES/DRESSINGS) IMPLANT
GLENOSPHERE RSA 32 -6 W/SCRW (Shoulder) IMPLANT
GLOVE BIOGEL PI IND STRL 8 (GLOVE) ×2 IMPLANT
GLOVE PI ULTRA LF STRL 7.5 (GLOVE) ×2 IMPLANT
GLOVE SURG ORTHO 8.0 STRL STRW (GLOVE) ×2 IMPLANT
GLOVE SURG SYN 8.0 (GLOVE) ×2 IMPLANT
GLOVE SURG SYN 8.0 PF PI (GLOVE) ×1 IMPLANT
GOWN SRG LRG LVL 4 IMPRV REINF (GOWNS) ×2 IMPLANT
GOWN SRG XL LONG LVL 3 NONREIN (GOWNS) ×1 IMPLANT
GUIDE BONE MODEL RSA DJO (ORTHOPEDIC DISPOSABLE SUPPLIES) IMPLANT
GUIDE WIRE ALTIVATE 2.4X228 SL (WIRE) IMPLANT
GUIDEWIRE GLENOID 2.5X220 (WIRE) IMPLANT
HOOD PEEL AWAY T7 (MISCELLANEOUS) ×3 IMPLANT
INSERT SMALL SOCKET 32MM NEU (Insert) IMPLANT
KIT STABILIZATION SHOULDER (MISCELLANEOUS) ×1 IMPLANT
MANIFOLD NEPTUNE II (INSTRUMENTS) ×1 IMPLANT
MASK FACE SPIDER DISP (MASK) ×1 IMPLANT
MAT ABSORB FLUID 56X50 GRAY (MISCELLANEOUS) ×1 IMPLANT
NDL REVERSE CUT 1/2 CRC (NEEDLE) IMPLANT
NDL SPNL 20GX3.5 QUINCKE YW (NEEDLE) IMPLANT
NEEDLE REVERSE CUT 1/2 CRC (NEEDLE) ×2 IMPLANT
NEEDLE SPNL 20GX3.5 QUINCKE YW (NEEDLE) IMPLANT
NS IRRIG 500ML POUR BTL (IV SOLUTION) ×1 IMPLANT
PACK ARTHROSCOPY SHOULDER (MISCELLANEOUS) ×1 IMPLANT
PAD WRAPON POLAR SHDR XLG (MISCELLANEOUS) ×1 IMPLANT
PENCIL SMOKE EVACUATOR (MISCELLANEOUS) ×1 IMPLANT
SCREW CENTR ALTIVATE 6.5X30 (Screw) IMPLANT
SCREW PERI ALTIVATE REV 14 (Screw) IMPLANT
SCREW PERI ALTIVATE REV 18 (Screw) IMPLANT
SCREW PERI ALTIVATE REV 34 (Screw) IMPLANT
SLING ULTRA II LG (MISCELLANEOUS) IMPLANT
SLING ULTRA II M (MISCELLANEOUS) IMPLANT
SOL .9 NS 3000ML IRR UROMATIC (IV SOLUTION) ×1 IMPLANT
SPONGE T-LAP 18X18 ~~LOC~~+RFID (SPONGE) ×1 IMPLANT
STAPLER SKIN PROX 35W (STAPLE) IMPLANT
STEM HUMERAL SM SHELL SHOU 10 (Miscellaneous) IMPLANT
STRAP SAFETY 5IN WIDE (MISCELLANEOUS) ×1 IMPLANT
SUT MNCRL AB 4-0 PS2 18 (SUTURE) IMPLANT
SUT PROLENE 6 0 P 1 18 (SUTURE) IMPLANT
SUT TICRON 2-0 30IN 311381 (SUTURE) ×2 IMPLANT
SUT VIC AB 0 CT1 36 (SUTURE) ×1 IMPLANT
SUT VIC AB 2-0 CT2 27 (SUTURE) ×2 IMPLANT
SUT XBRAID 1.4 BLK/WHT (SUTURE) IMPLANT
SUT XBRAID 1.4 BLUE (SUTURE) IMPLANT
SUT XBRAID 1.4 WHITE/BLUE (SUTURE) IMPLANT
SUT XBRAID 2 BLACK/BLUE (SUTURE) IMPLANT
SUTURE ETHBND 5-0 MS/4 CCS GRN (SUTURE) ×1 IMPLANT
SUTURE FIBERWR #2 38 BLUE 1/2 (SUTURE) ×4 IMPLANT
TAP CANN GLEN 6.5 (TAP) IMPLANT
TIP FAN IRRIG PULSAVAC PLUS (DISPOSABLE) ×1 IMPLANT
TRAP FLUID SMOKE EVACUATOR (MISCELLANEOUS) ×1 IMPLANT
WATER STERILE IRR 1000ML POUR (IV SOLUTION) ×1 IMPLANT

## 2023-07-23 NOTE — Transfer of Care (Signed)
 Immediate Anesthesia Transfer of Care Note  Patient: Zahra Peffley  Procedure(s) Performed: ARTHROPLASTY, SHOULDER, TOTAL, REVERSE (Right: Shoulder) TENODESIS, BICEPS (Right: Shoulder)  Patient Location: PACU  Anesthesia Type:General and Regional  Level of Consciousness: awake, alert , and oriented  Airway & Oxygen Therapy: Patient Spontanous Breathing and Patient connected to face mask oxygen  Post-op Assessment: Report given to RN and Post -op Vital signs reviewed and stable  Post vital signs: stable  Last Vitals:  Vitals Value Taken Time  BP 114/68 07/23/23 1503  Temp    Pulse 81 07/23/23 1507  Resp 20 07/23/23 1507  SpO2 96 % 07/23/23 1507  Vitals shown include unfiled device data.  Last Pain:  Vitals:   07/23/23 1104  TempSrc: Temporal  PainSc: 7       Patients Stated Pain Goal: 2 (07/23/23 1104)  Complications: No notable events documented.

## 2023-07-23 NOTE — H&P (Signed)
 Paper H&P to be scanned into permanent record. H&P reviewed. No significant changes noted.

## 2023-07-23 NOTE — OR Nursing (Signed)
 Bracelet on right wrist was cut to remove from surgical extremity and placed in patient labeled bag and sent to PACU with patient.  Levester Reagin, RN

## 2023-07-23 NOTE — Op Note (Signed)
 SURGERY DATE: 07/23/2023   PRE-OP DIAGNOSIS:  1. Right shoulder rotator cuff arthropathy   POST-OP DIAGNOSIS:  1. Right shoulder rotator cuff arthropathy   PROCEDURES:  1. Right reverse total shoulder arthroplasty 2. Right biceps tenodesis   SURGEON: Cleotilde Dago, MD  ASSISTANTS: Steen Eden, RNFA   ANESTHESIA: Gen + interscalene block   ESTIMATED BLOOD LOSS: 200cc   TOTAL IV FLUIDS: per anesthesia record  IMPLANTS: [employer]: RSP Glenoid Head w/Retaining screw 32-6; Augmented (10 degree) Reverse Shoulder Baseplate with 6.35mm x 30mm central screw; 3 locking screws into baseplate; Small Shell Short Humeral Stem 10 x 48mm; Neutral Small Socket Insert;    INDICATION(S):  [name] is a 73 y.o. female with chronic shoulder pain with inability to lift arm overhead. Imaging consistent with glenohumeral degenerative changes with rotator cuff tear. Conservative measures including medications and cortisone injections have not provided adequate relief. After discussion of risks, benefits, and alternatives to surgery, the patient elected to proceed with reverse shoulder arthroplasty and biceps tenodesis.   OPERATIVE FINDINGS: massive rotator cuff tear (complete supraspinatus, partial infraspinatus, partial subscapularis); glenohumeral joint degenerative changes   OPERATIVE REPORT:   I identified [name]  in the pre-operative holding area. Informed consent was obtained and the surgical site was marked. I reviewed the risks and benefits of the proposed surgical intervention and the patient wished to proceed. An interscalene block with Exparel  was administered by the Anesthesia team. The patient was transferred to the operative suite and general anesthesia was administered. The patient was placed in the beach chair position with the head of the bed elevated approximately 45 degrees. All down side pressure points were appropriately padded. Pre-op exam under anesthesia confirmed some  stiffness and crepitus. Appropriate IV antibiotics were administered. The extremity was then prepped and draped in standard fashion. A time out was performed confirming the correct extremity, correct patient, and correct procedure.   We used the standard deltopectoral incision from the coracoid to ~12cm distal. We found the cephalic vein and took it laterally. We opened the deltopectoral interval widely and placed retractors under the CA ligament in the subacromial space and under the deltoid tendon at its insertion. We then abducted and internally rotated the arm and released the underlying bursa between these retractors, taking care not to damage the circumflex branch of the axillary nerve.   Next, we brought the arm back in adduction at slight forward flexion with external rotation. We opened the clavipectoral fascia lateral to the conjoint tendon. We gently palpated the axillary nerve and verified its position and continuity on both sides of the humerus with a Tug test. This test was repeated multiple times during the procedure for nerve localization and confirmed to be intact at the end of the case. We then cauterized the anterior humeral circumflex ("Three sisters") vessels. The arm was then internally rotated, we cut the falciform ligament at approximately 1 cm of the upper portion of the pectoralis major insertion. Next we unroofed the bicipital groove. We proceeded with a soft tissue biceps tenodesis given the pathology (significant thickening and tendinopathy proximally) of the tendon.  After opening the biceps tendon sheath all the way to the supraglenoid tubercle, we performed a biceps tenodesis with two #2 TiCron sutures to the upper border of the pectoralis major. The proximal portion of the tendon was excised.   At this point, we could see that the supraspinatus was completely torn with a bald humeral head superiorly. The subscapularis also had a partial  tear of the superior fibers. We performed  a subscapularis peel using electrocautery to remove the anterior capsule and subscapularis off of the humeral head. We released the inferior capsule from the humerus all the way to the posterior band of the inferior glenohumeral ligament. When this was complete we gently dislocated the shoulder up into the wound. We removed any osteophytes and made our cut with the appropriate inclination in 20 degrees of retroversion.  We then turned our attention back to the glenoid. The proximal humerus was retracted posteriorly. The anterior capsule was dissected free from the subscapularis. The anterior capsule was then excised, exposing the anterior glenoid. We then grasped the labrum and removed it circumferentially. During the glenoid exposure, the axillary nerve was protected the entire time.    A patient-specific guide was used to drill the central guidepin. Reaming was performed such that the augment was located ~12 o'clock position. A cannulated tap was placed over the guidepin. Central hole was measured and above mentioned central screw was selected. The baseplate was inserted until appropriate contact was achieved with the glenoid. Central screw was placed and achieved excellent fixation such that the entire scapula rotated with further attempted seating of the baseplate. The peripheral screws were drilled, measured, and placed. The wound was thoroughly irrigated. The glenosphere was then placed and screw was tightened.   We then turned our attention back to the humerus. We sized for a small shell prosthesis. We sequentially used larger diameter canal finders until we met appropriate resistance and sequential broaching was performed to this size listed above. Trial poly inserts were placed. The humerus was trialed and noted to have satisfactory stability, motion, and deltoid tension with above listed poly. The trial implants were removed. 3 drill holes were placed about the lesser tuberosity footprint and  FiberWire sutures were passed through these holes for subscapularis repair. Next, the implant was placed with the appropriate retroversion. Stability was confirmed. We placed the actual poly insert. The humerus was reduced and motion, tension, and stability were satisfactory. A Hemovac drain was placed. The wound was thoroughly irrigated.  Subscapularis was repaired with the previously passed #2 FiberWire sutures after passing them through the subscapularis.   We again verified the tension on the axillary nerve, appropriate range of motion, stability of the implant, and security of the subscapularis repair. We closed the deltopectoral interval deep to the cephalic vein with a running, 0-Vicryl suture. The skin was closed with 2-0 Vicryl and staples. Sterile dressings were applied. A PolarCare unit and sling were placed. Patient was extubated, transferred to a stretcher bed and to the post anesthesia care unit in stable condition.    POSTOPERATIVE PLAN: The patient will be admitted with plan for discharge home on POD#1. Operative arm to remain in sling at all times except RoM exercises and hygiene. Can perform pendulums, elbow/wrist/hand RoM exercises. Passive RoM allowed to 90 FF and 30 ER. ASA 325mg  x 6 weeks for DVT ppx. Plan for PT starting on POD #3-4. Patient to return to clinic in ~2 weeks for post-operative appointment.

## 2023-07-23 NOTE — Anesthesia Procedure Notes (Signed)
 Anesthesia Regional Block: Interscalene brachial plexus block   Pre-Anesthetic Checklist: , timeout performed,  Correct Patient, Correct Site, Correct Laterality,  Correct Procedure, Correct Position, site marked,  Risks and benefits discussed,  Surgical consent,  Pre-op evaluation,  At surgeon's request and post-op pain management  Laterality: Right  Prep: chloraprep       Needles:  Injection technique: Single-shot  Needle Type: Stimiplex     Needle Length: 9cm  Needle Gauge: 22     Additional Needles:   Procedures:,,,, ultrasound used (permanent image in chart),,    Narrative:  Start time: 07/23/2023 11:50 AM End time: 07/23/2023 11:55 AM Injection made incrementally with aspirations every 5 mL.  Performed by: Personally  Anesthesiologist: Baltazar Bonier, MD  Additional Notes: Patient consented for risk and benefits of nerve block including but not limited to nerve damage, failed block, bleeding and infection.  Patient voiced understanding.  Functioning IV was confirmed and monitors were applied.  Timeout done prior to procedure and prior to any sedation being given to the patient.  Patient confirmed procedure site prior to any sedation given to the patient. Sterile prep,hand hygiene and sterile gloves were used.  Minimal sedation used for procedure.  No paresthesia endorsed by patient during the procedure.  Negative aspiration and negative test dose prior to incremental administration of local anesthetic. The patient tolerated the procedure well with no immediate complications.
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
## 2023-07-23 NOTE — Plan of Care (Signed)
  Problem: Clinical Measurements: Goal: Respiratory complications will improve Outcome: Progressing   Problem: Activity: Goal: Risk for activity intolerance will decrease Outcome: Progressing   Problem: Nutrition: Goal: Adequate nutrition will be maintained Outcome: Progressing   Problem: Coping: Goal: Level of anxiety will decrease Outcome: Progressing   Problem: Safety: Goal: Ability to remain free from injury will improve Outcome: Progressing   

## 2023-07-23 NOTE — Anesthesia Procedure Notes (Signed)
 Procedure Name: Intubation Date/Time: 07/23/2023 12:16 PM  Performed by: Bill Budd, CRNAPre-anesthesia Checklist: Patient identified, Patient being monitored, Timeout performed, Emergency Drugs available and Suction available Patient Re-evaluated:Patient Re-evaluated prior to induction Oxygen Delivery Method: Circle system utilized Preoxygenation: Pre-oxygenation with 100% oxygen Induction Type: IV induction Ventilation: Mask ventilation without difficulty Laryngoscope Size: 3 and McGrath Grade View: Grade I Tube type: Oral Tube size: 6.5 mm Number of attempts: 1 Airway Equipment and Method: Stylet Placement Confirmation: ETT inserted through vocal cords under direct vision, positive ETCO2 and breath sounds checked- equal and bilateral Secured at: 21 cm Tube secured with: Tape Dental Injury: Teeth and Oropharynx as per pre-operative assessment

## 2023-07-23 NOTE — Discharge Instructions (Signed)
 Anita Dago, MD  [facility]  Phone: [phone]  Fax: [phone]   Discharge Instructions after Reverse Shoulder Replacement    1. Activity/Sling: You are to be non-weight bearing on operative extremity. A sling/shoulder immobilizer has been provided for you. Only remove the sling to perform elbow, wrist, and hand RoM exercises and hygiene/dressing. Active reaching and lifting are not permitted. You will be given further instructions on sling use at your first physical therapy visit and postoperative visit with Dr. Lydia Sams.   2. Dressings: Dressing may be removed at 1st physical therapy visit (~3-4 days after surgery). Afterwards, you may either leave open to air (if no drainage) or cover with dry, sterile dressing. If you have steri-strips on your wound, please do not remove them. They will fall off on their own. You may shower 5 days after surgery. Please pat incision dry. Do not rub or place any shear forces across incision. If there is drainage or any opening of incision after 5 days, please notify our offices immediately.    3. Driving:  Plan on not driving for six weeks. Please note that you are advised NOT to drive while taking narcotic pain medications as you may be impaired and unsafe to drive.   4. Medications:  - You have been provided a prescription for narcotic pain medicine (usually oxycodone ). After surgery, take 1-2 narcotic tablets every 4 hours if needed for severe pain. Please start this as soon as you begin to start having pain (if you received a nerve block, start taking as soon as this wears off).  - A prescription for anti-nausea medication will be provided in case the narcotic medicine causes nausea - take 1 tablet every 6 hours only if nauseated.  - Take enteric coated aspirin  325 mg once daily for 6 weeks to prevent blood clots. Do not take aspirin  if you have an aspirin  sensitivity/allergy or asthma or are on an anticoagulant (blood thinner) already. If so, then  your home anticoagulant will be resume and managed - do not take aspirin . -Take tylenol  1000mg  (2 Extra strength or 3 regular strength tablets) every 8 hours for pain. This will reduce the amount of narcotic medication needed. May stop tylenol  when you are having minimal pain. - Take a stool softener (Colace, Dulcolax or Senakot) if you are using narcotic pain medications to help with constipation that is associated with narcotic use. - DO NOT take ANY nonsteroidal anti-inflammatory pain medications: Advil, Motrin, Ibuprofen, Aleve, Naproxen, or Naprosyn.   If you are taking prescription medication for anxiety, depression, insomnia, muscle spasm, chronic pain, or for attention deficit disorder you are advised that you are at a higher risk of adverse effects with use of narcotics post-op, including narcotic addiction/dependence, depressed breathing, death. If you use non-prescribed substances: alcohol, marijuana, cocaine, heroin, methamphetamines, etc., you are at a higher risk of adverse effects with use of narcotics post-op, including narcotic addiction/dependence, depressed breathing, death. You are advised that taking > 50 morphine  milligram equivalents (MME) of narcotic pain medication per day results in twice the risk of overdose or death. For your prescription provided: oxycodone  5 mg - taking more than 6 tablets per day after the first few days of surgery.   5. Physical Therapy: 1-2 times per week for ~12 weeks. Therapy typically starts on post operative Day 3 or 4. You have been provided an order for physical therapy. The therapist will provide home exercises. Please contact our offices if this appointment has not been scheduled.  6. Work: May do light duty/desk job in approximately 2 weeks when off of narcotics, pain is well-controlled, and swelling has decreased if able to function with one arm in sling. Full work may take 6 weeks if light motions and function of both arms is required.  Lifting jobs may require 12 weeks.   7. Post-Op Appointments: Your first post-op appointment will be with Dr. Lydia Sams in approximately 2 weeks time.    If you find that they have not been scheduled please call the Orthopaedic Appointment front desk at [phone].                               Anita Dago, MD [facility] Phone: [phone] Fax: [phone]   REVERSE SHOULDER ARTHROPLASTY REHAB GUIDELINES   These guidelines should be tailored to individual patients based on their rehab goals, age, precautions, quality of repair, etc.  Progression should be based on patient progress and approval by the referring physician.  PHASE 1 - Day 1 through Week 2  GENERAL GUIDELINES AND PRECAUTIONS Sling wear 24/7 except during grooming and home exercises (3 to 5 times daily) Avoid shoulder extension such that the arm is posterior the frontal plane.  When patients recline, a pillow should be placed behind the upper arm and sling should be on.  They should be advised to always be able to see the elbow Avoid combined IR/ADD/EXT, such as hand behind back to prevent dislocation Avoid combined IR and ADD such as reaching across the chest to prevent dislocation No AROM No submersion in pool/water for 4 weeks No weight bearing through operative arm (as in transfers, walker use, etc.)  GOALS Maintain integrity of joint replacement; protect soft tissue healing Increase PROM for elevation to 120 and ER to 30 (will remain the goal for first 6 weeks) Optimize distal UE circulation and muscle activity (elbow, wrist and hand) Instruct in use of sling for proper fit, polar care device for ice application after HEP, signs/symptoms of infection  EXERCISES Active elbow, wrist and hand Passive forward elevation in scapular plane to 90-120 max motion; ER in scapular plane to 30 Active scapular retraction with arms resting in neutral position  CRITERIA TO PROGRESS TO  PHASE 2 Low pain (less than 3/10) with shoulder PROM Healing of incision without signs of infection Clearance by MD to advance after 2 week MD check up  PHASE 2 - 2 weeks - 6 weeks  GENERAL GUIDELINES AND PRECAUTIONS Sling may be removed while at home; worn in community without abduction pillow May use arm for light activities of daily living (such as feeding, brushing teeth, dressing.) with elbow near  the side of the body  and arm in front of the body- no active lifting of the arm May submerge in water (tub, pool, [place], etc.) after 4 weeks Continue to avoid WBing through the operative arm Continue to avoid combined IR/EXT/ADD (hand behind the back) and IR/ADD  (reaching across chest) for dislocation precautions  GOALS  Achieve passive elevation to 120 and ER to 30  Low (less than 3/10) to no pain  Ability to fire all heads of the deltoid  EXERCISES May discontinue grip, and active elbow and wrist exercises since using the arm in ADL's  with sling removed around the home Continue passive elevation to 120 and ER to 30, both in scapular plane with arm supported on table top Add submaximal isometrics, pain free effort, for all  functional heads of deltoid (anterior, posterior, middle)  Ensure that with posterior deltoid isometric the shoulder does not move into extension and the arm remains anterior the frontal plane At 4 weeks:  begin to place arm in balanced position of 90 deg elevation in supine; when patient able to hold this position with ease, may begin reverse pendulums clockwise and counterclockwise  CRITERIA TO PROGRESS TO PHASE 3 Passive forward elevation in scapular plane to 120; passive ER in scapular plane to 30 Ability to fire isometrically all heads of the deltoid muscle without pain Ability to place and hold the arm in balanced position (90 deg elevation in supine)  PHASE 3 - 6 weeks to 3 months  GENERAL GUIDELINES AND PRECAUTIONS Discontinue use of sling Avoid  forcing end range motion in any direction to prevent dislocation  May advance use of the arm actively in ADL's without being restricted to arm by the side of the body, however, avoid heavy lifting and sports (forever!) May initiate functional IR behind the back gently NO UPPER BODY ERGOMETER   GOALS Optimize PROM for elevation and ER in scapular plane with realistic expectation that max  mobility for elevation is usually around 145-160 passively; ER 40 to 50 passively; functional IR to L1 Recover AROM to approach as close to PROM available as possible; may expect 135-150 deg active elevation; 30 deg active ER; active functional IR to L1 Establish dynamic stability of the shoulder with deltoid and periscapular muscle gradual strengthening  EXERCISES Forward elevation in scapular plane active progression: supine to incline, to vertical; short to long lever arm Balanced position long lever arm AROM Active ER/IR with arm at side Scapular retraction with light band resistance Functional IR with hand slide up back - very gentle and gradual NO UPPER BODY ERGOMETER     CRITERIA TO PROGRESS TO PHASE 4  AROM equals/approaches PROM with good mechanics for elevation   No pain  Higher level demand on shoulder than ADL functions   PHASE 4 12 months and beyond  GENERAL GUIDELINES AND PRECAUTIONS No heavy lifting and no overhead sports No heavy pushing activity Gradually increase strength of deltoid and scapular stabilizers; also the rotator cuff if present with weights not to exceed 5 lbs NO UPPER BODY ERGOMETER   GOALS  Optimize functional use of the operative UE to meet the desired demands  Gradual increase in deltoid, scapular muscle, and rotator cuff strength  Pain free functional activities   EXERCISES Add light hand weights for deltoid up to and not to exceed 3 lbs for anterior and posterior with long arm lift against gravity; elbow bent to 90 deg for abduction in scapular  plane Theraband progression for extension to hip with scapular depression/retraction Theraband progression for serratus anterior punches in supine; avoid wall, incline or prone pressups for serratus anterior End range stretching gently without forceful overpressure in all planes (elevation in scapular plane, ER in scapular plane, functional IR) with stretching done for life as part of a daily routine NO UPPER BODY ERGOMETER     CRITERIA FOR DISCHARGE FROM SKILLED PHYSICAL THERAPY  Pain free AROM for shoulder elevation (expect around 135-150)  Functional strength for all ADL's, work tasks, and hobbies approved by Careers adviser  Independence with home maintenance program   NOTES: 1. With proper exercise, motion, strength, and function continue to improve even after one year. 2. The complication rate after surgery is 5 - 8%. Complications include infection, fracture, heterotopic bone formation, nerve injury, instability, rotator cuff  tear, and tuberosity nonunion. Please look for clinical signs, unusual symptoms, or lack of progress with therapy and report those to Dr. Lydia Sams. Prefer more communication than less.  3. The therapy plan above only serves as a guide. Please be aware of specific individualized patient instructions as written on the prescription or through discussions with the surgeon. 4. Please call Dr. Lydia Sams if you have any specific questions or concerns [phone]   [facility]  They will call you to schedule when they will come out to see you for nursing and Physical Therapy Home health care service [id number]  [phone] Open ? Closes 5?PM

## 2023-07-24 ENCOUNTER — Encounter: Payer: Self-pay | Admitting: Orthopedic Surgery

## 2023-07-24 DIAGNOSIS — M19011 Primary osteoarthritis, right shoulder: Secondary | ICD-10-CM | POA: Diagnosis not present

## 2023-07-24 LAB — BASIC METABOLIC PANEL WITH GFR
Anion gap: 6 (ref 5–15)
BUN: 21 mg/dL (ref 8–23)
CO2: 28 mmol/L (ref 22–32)
Calcium: 9 mg/dL (ref 8.9–10.3)
Chloride: 102 mmol/L (ref 98–111)
Creatinine, Ser: 0.97 mg/dL (ref 0.44–1.00)
GFR, Estimated: >60 mL/min (ref >60)
Glucose, Bld: 162 mg/dL — ABNORMAL HIGH (ref 70–99)
Potassium: 5.8 mmol/L — ABNORMAL HIGH (ref 3.5–5.1)
Sodium: 136 mmol/L (ref 135–145)

## 2023-07-24 LAB — CBC
HCT: 40.4 % (ref 36.0–46.0)
Hemoglobin: 13.1 g/dL (ref 12.0–15.0)
MCH: 30.5 pg (ref 26.0–34.0)
MCHC: 32.4 g/dL (ref 30.0–36.0)
MCV: 94 fL (ref 80.0–100.0)
Platelets: 241 10*3/uL (ref 150–400)
RBC: 4.3 MIL/uL (ref 3.87–5.11)
RDW: 12.7 % (ref 11.5–15.5)
WBC: 11.9 10*3/uL — ABNORMAL HIGH (ref 4.0–10.5)
nRBC: 0 % (ref 0.0–0.2)

## 2023-07-24 LAB — POTASSIUM: Potassium: 4.4 mmol/L (ref 3.5–5.1)

## 2023-07-24 MED ORDER — ASPIRIN 325 MG PO TBEC: 325.0000 mg | DELAYED_RELEASE_TABLET | Freq: Every day | ORAL | 0 refills | Status: DC

## 2023-07-24 MED ORDER — ACETAMINOPHEN 500 MG PO TABS: 1000.0000 mg | ORAL_TABLET | Freq: Three times a day (TID) | ORAL | 0 refills | Status: AC

## 2023-07-24 MED ORDER — OXYCODONE HCL 5 MG PO TABS: 5.0000 mg | ORAL_TABLET | ORAL | 0 refills | Status: AC | PRN

## 2023-07-24 MED ORDER — SENNOSIDES-DOCUSATE SODIUM 8.6-50 MG PO TABS: 1.0000 | ORAL_TABLET | Freq: Every evening | ORAL | 0 refills | Status: DC | PRN

## 2023-07-24 NOTE — Plan of Care (Signed)
  Problem: Health Behavior/Discharge Planning: Goal: Ability to manage health-related needs will improve Outcome: Progressing   Problem: Clinical Measurements: Goal: Respiratory complications will improve Outcome: Progressing   Problem: Activity: Goal: Risk for activity intolerance will decrease Outcome: Progressing   Problem: Nutrition: Goal: Adequate nutrition will be maintained Outcome: Progressing   Problem: Coping: Goal: Level of anxiety will decrease Outcome: Progressing   Problem: Elimination: Goal: Will not experience complications related to urinary retention Outcome: Progressing   Problem: Pain Managment: Goal: General experience of comfort will improve and/or be controlled Outcome: Progressing   Problem: Safety: Goal: Ability to remain free from injury will improve Outcome: Progressing   Problem: Skin Integrity: Goal: Risk for impaired skin integrity will decrease Outcome: Progressing

## 2023-07-24 NOTE — Evaluation (Addendum)
 Physical Therapy Evaluation Patient Details Name: [name] MRN: [id number] DOB: 12/19/1950 Today's Date: 07/24/2023  History of Present Illness  73 y/o female s/p R reverse total shoudler replacement / biceps tendonesis  Clinical Impression  Pt did well with POD1 PT eval and treat.  She showed good safety and confidence with mobility/activity.  PT reviewed HEP, total shoulder timeline and protocols - acknowledges and showed good understanding.  She was able to ambulate without AD at community appropriate speeds and showed no real fatigue or other issues.  Pt will benefit from continued PT per total shoulder protocols.         If plan is discharge home, recommend the following: Assist for transportation;Assistance with cooking/housework;Help with stairs or ramp for entrance   Can travel by private vehicle        Equipment Recommendations None recommended by PT  Recommendations for Other Services       Functional Status Assessment Patient has had a recent decline in their functional status and demonstrates the ability to make significant improvements in function in a reasonable and predictable amount of time.     Precautions / Restrictions Precautions Precautions: Shoulder Type of Shoulder Precautions: rev total Shoulder Interventions: Don joy ultra sling Precaution Booklet Issued: Yes (comment) Required Braces or Orthoses: Sling Restrictions Weight Bearing Restrictions Per Provider Order: Yes RUE Weight Bearing Per Provider Order: Non weight bearing      Mobility  Bed Mobility Overal bed mobility: Modified Independent             General bed mobility comments: Pt was able to get up to sitting EOB w/o assist, light use of L UE on rail.    Transfers Overall transfer level: Modified independent Equipment used: None               General transfer comment: Pt able to rise to standing w/o assist, did have some initial hesitancy with no LOBs or overt safety  concerns    Ambulation/Gait Ambulation/Gait assistance: Modified independent (Device/Increase time) Gait Distance (Feet): 200 Feet Assistive device: None         General Gait Details: Pt did very well with ambulation showing community appropriate speed and confidence, no LOBs, minimal fatigue.  Stairs Stairs: Yes Stairs assistance: Modified independent (Device/Increase time) Stair Management: One rail Left Number of Stairs: 4 General stair comments: Pt was able to easily and confidently negotiate up/down 4 steps reciprocally  Wheelchair Mobility     Tilt Bed    Modified Rankin (Stroke Patients Only)       Balance Overall balance assessment: Modified Independent                                           Pertinent Vitals/Pain Pain Assessment Pain Assessment: 0-10 Pain Score: 5  Pain Location: R shoulder/posterior UE - scalene block worn off    Home Living Family/patient expects to be discharged to:: Private residence Living Arrangements: Spouse/significant other Available Help at Discharge: Family;Available 24 hours/day Type of Home: House Home Access: Stairs to enter Entrance Stairs-Rails: b/l Entrance Stairs-Number of Steps: 5   Home Layout: One level Home Equipment: Cane - single Librarian, academic (2 wheels)      Prior Function Prior Level of Function : Independent/Modified Independent             Mobility Comments: Pt very independent and active with mobility -  reports she walks and runs errand regularly w/o issue       Extremity/Trunk Assessment   Upper Extremity Assessment Upper Extremity Assessment: Overall WFL for tasks assessed (R UE in immobilizer sling)    Lower Extremity Assessment Lower Extremity Assessment: Overall WFL for tasks assessed       Communication   Communication Communication: No apparent difficulties    Cognition Arousal: Alert Behavior During Therapy: WFL for tasks assessed/performed   PT  - Cognitive impairments: No apparent impairments                         Following commands: Intact       Cueing       General Comments General comments (skin integrity, edema, etc.): Pt was able to ambulate well, showed good awareness of post-op shoulder precautions    Exercises     Assessment/Plan    PT Assessment Patient needs continued PT services  PT Problem List Decreased strength;Decreased range of motion;Decreased activity tolerance;Decreased balance;Decreased mobility;Pain;Decreased safety awareness;Decreased knowledge of precautions       PT Treatment Interventions DME instruction;Gait training;Stair training;Functional mobility training;Therapeutic activities;Therapeutic exercise;Balance training;Patient/family education    PT Goals (Current goals can be found in the Care Plan section)  Acute Rehab PT Goals Patient Stated Goal: Go home today PT Goal Formulation: With patient Time For Goal Achievement: 08/06/23 Potential to Achieve Goals: Good    Frequency 7X/week     Co-evaluation               AM-PAC PT "6 Clicks" Mobility  Outcome Measure Help needed turning from your back to your side while in a flat bed without using bedrails?: None Help needed moving from lying on your back to sitting on the side of a flat bed without using bedrails?: None Help needed moving to and from a bed to a chair (including a wheelchair)?: None Help needed standing up from a chair using your arms (e.g., wheelchair or bedside chair)?: None Help needed to walk in hospital room?: A Little Help needed climbing 3-5 steps with a railing? : A Little 6 Click Score: 22    End of Session   Activity Tolerance: Patient tolerated treatment well Patient left: in chair;with call bell/phone within reach Nurse Communication: Mobility status PT Visit Diagnosis: Muscle weakness (generalized) (M62.81);Pain Pain - Right/Left: Right Pain - part of body: Shoulder    Time:  5188-4166 PT Time Calculation (min) (ACUTE ONLY): 27 min   Charges:   PT Evaluation $PT Eval Low Complexity: 1 Low PT Treatments $Therapeutic Activity: 8-22 mins PT General Charges $$ ACUTE PT VISIT: 1 Visit         Darice Edelman, DPT 07/24/2023, 10:27 AM

## 2023-07-24 NOTE — Telephone Encounter (Signed)
 Done

## 2023-07-24 NOTE — Progress Notes (Signed)
 Discharge Summary for [name]  Discharge Plan: Patient will be discharged home as per the MD's order. We discussed prescriptions and follow-up appointments with the patient. The prescriptions were provided, and the medication list was explained in detail. Patient confirmed understanding of the instructions.  Skin Assessment: The patient's skin is clean, dry, and intact, with no signs of breakdown or tears. The IV catheter was removed, and the skin remains intact. The site shows no signs or symptoms of complications. A dressing and pressure were applied to the site. Patient reports no pain and has no complaints.  After-Visit Summary: An After-Visit Summary was printed and given to the patient. The following items were sent home with patient:  Polar care Two honeycomb bandages Two TED hose placed on both patient legs  The patient was escorted via wheelchair and discharged home in a private vehicle.  Seylah Wernert D. Donnette Gal, RN

## 2023-07-24 NOTE — Evaluation (Signed)
 Occupational Therapy Evaluation Patient Details Name: [name] MRN: [id number] DOB: 1950/05/19 Today's Date: 07/24/2023   History of Present Illness   73 y/o female s/p R reverse total shoudler replacement / biceps tendonesis     Clinical Impressions Pt was seen for an OT evaluation this date. Pt lives with multiple family members who are home to assist pt as needed. Reports her "bonus daughter" will be assisting her while she recovers, although she works every day. Has other that can manage her polar care and other household tasks. Prior to surgery, pt was active and independent, driving, etc without AD use. Pt has orders for RUE to be immobilized and will be NWBing per MD. She presents with impaired strength/ROM, pain to RUE. Reports nausea this date and is lethargic during session. No caregiver able to be present for training, therefore video recorded session on pt's phone for carryover at home and edu on use of youtube. These impairments result in a decreased ability to perform self care tasks requiring Max assist for UB/LB ADLs and max assist for application of polar care, compression stockings, and sling/immobilizer. Pt instructed in polar care mgt, compression stockings mgt, sling/immobilizer mgt, ROM exercises for RUE (with instructions for no shoulder exercises until full sensation has returned), RUE precautions, adaptive strategies for ADL performance, positioning and considerations for sleep, and home/routines modifications to maximize falls prevention, safety, and independence. Handout provided. OT adjusted sling/immobilizer and polar care to improve comfort, optimize positioning, and to maximize skin integrity/safety. Pt verbalized understanding of all education/training provided. Pt will benefit from skilled OT services to address these limitations and improve independence in daily tasks. Recommend HHOT services to continue therapy to maximize return to PLOF, address home/routines  modifications and safety, minimize falls risk, and minimize caregiver burden.       If plan is discharge home, recommend the following:   A little help with walking and/or transfers;A lot of help with bathing/dressing/bathroom     Functional Status Assessment   Patient has had a recent decline in their functional status and demonstrates the ability to make significant improvements in function in a reasonable and predictable amount of time.     Equipment Recommendations   None recommended by OT     Recommendations for Other Services         Precautions/Restrictions   Precautions Precautions: Shoulder Type of Shoulder Precautions: rev total Shoulder Interventions: Don joy ultra sling Precaution Booklet Issued: Yes (comment) Required Braces or Orthoses: Sling Restrictions Weight Bearing Restrictions Per Provider Order: Yes RUE Weight Bearing Per Provider Order: Non weight bearing     Mobility Bed Mobility               General bed mobility comments: NT up in recliner pre/post session    Transfers Overall transfer level: Needs assistance   Transfers: Sit to/from Stand Sit to Stand: Supervision           General transfer comment: supervision provided d/t nausea and closing her eyes throughout session needed frequent seated rest breaks      Balance Overall balance assessment: Modified Independent                                         ADL either performed or assessed with clinical judgement   ADL Overall ADL's : Needs assistance/impaired  Upper Body Dressing : Maximal assistance;Sitting;Standing       Toilet Transfer: Supervision/safety;Contact guard assist Toilet Transfer Details (indicate cue type and reason): STS from recliner                 Vision         Perception         Praxis         Pertinent Vitals/Pain Pain Assessment Pain Assessment: 0-10 Pain Score: 8  Pain  Location: R shoulder/posterior UE - scalene block worn off Pain Descriptors / Indicators: Sore Pain Intervention(s): Monitored during session, Repositioned     Extremity/Trunk Assessment Upper Extremity Assessment Upper Extremity Assessment: Overall WFL for tasks assessed;RUE deficits/detail RUE Deficits / Details: s/p R reverse TSA   Lower Extremity Assessment Lower Extremity Assessment: Overall WFL for tasks assessed       Communication Communication Communication: No apparent difficulties   Cognition Arousal: Alert, Lethargic Behavior During Therapy: WFL for tasks assessed/performed               OT - Cognition Comments: closes eyes throughout session, reports nausea                 Following commands: Intact       Cueing  General Comments      no LOB, just sleepy and nauseous during session with 8/10 shoulder pain   Exercises Other Exercises Other Exercises: Edu on role of OT in acute setting and all shoulder precautions, polar care, etc.   Shoulder Instructions      Home Living Family/patient expects to be discharged to:: Private residence Living Arrangements: Spouse/significant other Available Help at Discharge: Family;Available 24 hours/day Type of Home: House Home Access: Stairs to enter [employer] of Steps: 5 Entrance Stairs-Rails: Right;Left Home Layout: One level               Home Equipment: Cane - single Librarian, academic (2 wheels)          Prior Functioning/Environment Prior Level of Function : Independent/Modified Independent             Mobility Comments: Pt very independent and active with mobility - reports she walks and runs errand regularly w/o issue. ADLs Comments: IND, drives    OT Problem List: Decreased strength;Decreased range of motion   OT Treatment/Interventions: Self-care/ADL training;Therapeutic activities;Therapeutic exercise;DME and/or AE instruction;Patient/family education;Balance  training      OT Goals(Current goals can be found in the care plan section)   Acute Rehab OT Goals Patient Stated Goal: return home OT Goal Formulation: With patient Time For Goal Achievement: 08/07/23 Potential to Achieve Goals: Good ADL Goals Pt Will Perform Lower Body Bathing: with min assist;sitting/lateral leans;sit to/from stand;with adaptive equipment;with contact guard assist Pt Will Perform Lower Body Dressing: with min assist;with contact guard assist;sit to/from stand;sitting/lateral leans;with adaptive equipment Pt Will Transfer to Toilet: with modified independence;regular height toilet;ambulating Additional ADL Goal #1: Pt will demo implementation of all shoulder precautions during ADL performance on 2/2 trials.   OT Frequency:  Min 2X/week    Co-evaluation              AM-PAC OT "6 Clicks" Daily Activity     Outcome Measure Help from another person eating meals?: None Help from another person taking care of personal grooming?: A Little Help from another person toileting, which includes using toliet, bedpan, or urinal?: A Little Help from another person bathing (including washing, rinsing, drying)?: A Lot Help from another  person to put on and taking off regular upper body clothing?: A Lot Help from another person to put on and taking off regular lower body clothing?: A Lot 6 Click Score: 16   End of Session Nurse Communication: Mobility status  Activity Tolerance: Patient tolerated treatment well Patient left: in chair;with call bell/phone within reach;with nursing/sitter in room  OT Visit Diagnosis: Other abnormalities of gait and mobility (R26.89)                Time: 1610-9604 OT Time Calculation (min): 66 min Charges:  OT General Charges $OT Visit: 1 Visit OT Evaluation $OT Eval Moderate Complexity: 1 Mod OT Treatments $Self Care/Home Management : 23-37 mins $Therapeutic Activity: 8-22 mins Urias Sheek, OTR/L 07/24/23, 12:48 PM  Leala Bryand E  Aryka Coonradt 07/24/2023, 12:45 PM

## 2023-07-24 NOTE — Plan of Care (Signed)
   Problem: Coping: Goal: Level of anxiety will decrease Outcome: Progressing   Problem: Elimination: Goal: Will not experience complications related to urinary retention Outcome: Progressing   Problem: Pain Managment: Goal: General experience of comfort will improve and/or be controlled Outcome: Progressing
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
## 2023-07-24 NOTE — Procedures (Signed)
 [facility] [address] [place], [address]    Complete Pulmonary Function Testing Interpretation:  FINDINGS:  The forced vital capacity is moderately decreased.  FEV1 is 1.19 L which is 64% of predicted and is moderately decreased.  Postbronchodilator there was no significant improvement in FEV1.  Should be noted that patient had difficulty performing the spirometry maneuvers.  Total lung capacity is normal residual volume is increased more than a month or longer as ratio is increased FRC is increased.  DLCO was not able to be done due to coughing.  IMPRESSION:  This spirometry and lung volumes are suggestive of moderate obstructive lung disease clinical correlation is strongly recommended.  Cordie Deters, MD [facility] Pulmonary Critical Care Medicine Sleep Medicine

## 2023-07-24 NOTE — TOC Transition Note (Signed)
 Transition of Care [facility]) - Discharge Note   Patient Details  Name: [name] MRN: [id number] Date of Birth: 05/14/1950  Transition of [facility]) CM/SW Contact:  Alexandra Ice, RN Phone Number: 07/24/2023, 12:36 PM   Clinical Narrative:    Patient to discharge today. She needs home health services, PT and OT. Sent referral to several agencies. Cheryl with [facility] is able to accept. Added agency information to AVS.     Final next level of care: Home w Home Health Services Barriers to Discharge: Barriers Resolved   Patient Goals and CMS Choice   CMS Medicare.gov Compare Post Acute Care list provided to:: Patient   [place] ownership interest in [facility].provided to:: Patient    Discharge Placement                    Patient and family notified of of transfer: 07/24/23  Discharge Plan and Services Additional resources added to the After Visit Summary for                    DME Agency: NA       HH Arranged: PT, OT HH Agency: [facility] Date [facility] Agency Contacted: 07/24/23 Time HH Agency Contacted: 1235 Representative spoke with at [facility] Agency: Bartholomew Light  Social Drivers of Health (SDOH) Interventions SDOH Screenings   Food Insecurity: No Food Insecurity (07/23/2023)  Housing: Low Risk  (07/23/2023)  Transportation Needs: No Transportation Needs (07/23/2023)  Utilities: Not At Risk (07/23/2023)  Depression (PHQ2-9): Low Risk  (07/26/2020)  Recent Concern: Depression (PHQ2-9) - Medium Risk (06/08/2020)  Financial Resource Strain: Low Risk  (04/03/2023)   Received from [facility]  Physical Activity: Sufficiently Active (11/07/2021)   Received from [facility], [facility]  Social Connections: Moderately Integrated (07/23/2023)  Stress: No Stress Concern Present (11/07/2021)   Received from [facility], [facility]  Tobacco Use: Low Risk  (07/23/2023)  Health Literacy: Low Risk  (11/09/2022)   Received  from [facility]     Readmission Risk Interventions     No data to display

## 2023-07-25 NOTE — Anesthesia Postprocedure Evaluation (Signed)
 Anesthesia Post Note  Patient: [name]  Procedure(s) Performed: ARTHROPLASTY, SHOULDER, TOTAL, REVERSE (Right: Shoulder) TENODESIS, BICEPS (Right: Shoulder)  Patient location during evaluation: PACU Anesthesia Type: General Level of consciousness: awake and alert Pain management: pain level controlled Vital Signs Assessment: post-procedure vital signs reviewed and stable Respiratory status: spontaneous breathing, nonlabored ventilation and respiratory function stable Cardiovascular status: blood pressure returned to baseline and stable Postop Assessment: no apparent nausea or vomiting Anesthetic complications: no   No notable events documented.   Last Vitals:  Vitals:   07/24/23 0500 07/24/23 0741  BP: 127/60 111/71  Pulse: 88 97  Resp: 16 18  Temp: 36.6 C 36.8 C  SpO2: 100% 99%    Last Pain:  Vitals:   07/24/23 1011  TempSrc:   PainSc: 8                  Baltazar Bonier

## 2023-08-01 ENCOUNTER — Encounter: Payer: Self-pay | Admitting: Nurse Practitioner

## 2023-08-01 ENCOUNTER — Ambulatory Visit (HOSPITAL_COMMUNITY): Admission: RE | Admit: 2023-08-01 | Payer: 59 | Source: Home / Self Care

## 2023-08-01 ENCOUNTER — Ambulatory Visit (HOSPITAL_COMMUNITY): Payer: 59

## 2023-08-01 ENCOUNTER — Encounter (HOSPITAL_COMMUNITY): Payer: Self-pay

## 2023-08-01 ENCOUNTER — Encounter: Admission: RE | Payer: Self-pay | Source: Home / Self Care

## 2023-08-01 HISTORY — DX: Panic disorder (episodic paroxysmal anxiety): F41.0

## 2023-08-01 HISTORY — DX: Other forms of dyspnea: R06.09

## 2023-08-01 HISTORY — DX: Insomnia, unspecified: G47.00

## 2023-08-01 HISTORY — DX: Claustrophobia: F40.240

## 2023-08-01 HISTORY — DX: Cataract extraction status, right eye: Z98.42

## 2023-08-01 HISTORY — DX: Dorsopathy, unspecified: M53.9

## 2023-08-01 HISTORY — DX: Cataract extraction status, right eye: Z98.41

## 2023-08-01 SURGERY — MRI WITH ANESTHESIA
Anesthesia: General

## 2023-08-05 ENCOUNTER — Other Ambulatory Visit: Payer: Self-pay | Admitting: Nurse Practitioner

## 2023-08-13 NOTE — Telephone Encounter (Signed)
 error

## 2023-08-31 ENCOUNTER — Other Ambulatory Visit: Payer: Self-pay | Admitting: Nurse Practitioner

## 2023-08-31 DIAGNOSIS — G8929 Other chronic pain: Secondary | ICD-10-CM

## 2023-09-10 LAB — PULMONARY FUNCTION TEST

## 2023-09-25 ENCOUNTER — Ambulatory Visit: Admitting: Nurse Practitioner

## 2023-09-25 ENCOUNTER — Encounter: Payer: Self-pay | Admitting: Nurse Practitioner

## 2023-09-25 VITALS — BP 132/88 | HR 94 | Temp 97.5°F | Resp 16 | Ht 60.0 in | Wt 155.2 lb

## 2023-09-25 DIAGNOSIS — J449 Chronic obstructive pulmonary disease, unspecified: Secondary | ICD-10-CM

## 2023-09-25 DIAGNOSIS — Z79899 Other long term (current) drug therapy: Secondary | ICD-10-CM

## 2023-09-25 DIAGNOSIS — G4700 Insomnia, unspecified: Secondary | ICD-10-CM

## 2023-09-25 DIAGNOSIS — E039 Hypothyroidism, unspecified: Secondary | ICD-10-CM

## 2023-09-25 DIAGNOSIS — F419 Anxiety disorder, unspecified: Secondary | ICD-10-CM

## 2023-09-25 DIAGNOSIS — R7303 Prediabetes: Secondary | ICD-10-CM

## 2023-09-25 MED ORDER — LINACLOTIDE 290 MCG PO CAPS
290.0000 ug | ORAL_CAPSULE | Freq: Every day | ORAL | 1 refills | Status: AC
Start: 2023-09-25 — End: ?

## 2023-09-25 MED ORDER — EMPAGLIFLOZIN 10 MG PO TABS: 10.0000 mg | ORAL_TABLET | Freq: Every day | ORAL | 11 refills | Status: AC

## 2023-09-25 MED ORDER — ESCITALOPRAM OXALATE 10 MG PO TABS
10.0000 mg | ORAL_TABLET | Freq: Every day | ORAL | 3 refills | Status: DC
Start: 2023-09-25 — End: 2023-10-17

## 2023-09-25 MED ORDER — PANTOPRAZOLE SODIUM 40 MG PO TBEC: 40.0000 mg | DELAYED_RELEASE_TABLET | Freq: Two times a day (BID) | ORAL | 3 refills | Status: AC

## 2023-09-25 MED ORDER — ESZOPICLONE 3 MG PO TABS: 3.0000 mg | ORAL_TABLET | Freq: Every day | ORAL | 2 refills | Status: DC

## 2023-09-25 MED ORDER — LEVOTHYROXINE SODIUM 100 MCG PO TABS: 100.0000 ug | ORAL_TABLET | Freq: Every day | ORAL | 1 refills | Status: DC
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
## 2023-10-08 ENCOUNTER — Other Ambulatory Visit: Payer: Self-pay | Admitting: Neurosurgery

## 2023-10-16 ENCOUNTER — Encounter: Payer: Self-pay | Admitting: Nurse Practitioner

## 2023-10-17 ENCOUNTER — Other Ambulatory Visit: Payer: Self-pay | Admitting: Nurse Practitioner

## 2023-10-17 DIAGNOSIS — F419 Anxiety disorder, unspecified: Secondary | ICD-10-CM

## 2023-10-18 ENCOUNTER — Encounter: Payer: Self-pay | Admitting: Nurse Practitioner

## 2023-10-18 DIAGNOSIS — G4700 Insomnia, unspecified: Secondary | ICD-10-CM | POA: Insufficient documentation

## 2023-10-18 DIAGNOSIS — R7303 Prediabetes: Secondary | ICD-10-CM | POA: Insufficient documentation

## 2023-10-18 MED ORDER — EPINEPHRINE 0.3 MG/0.3ML IJ SOAJ: 0.3000 mg | INTRAMUSCULAR | 3 refills | Status: AC | PRN

## 2023-11-28 ENCOUNTER — Emergency Department

## 2023-11-28 ENCOUNTER — Ambulatory Visit: Admitting: Nurse Practitioner

## 2023-11-28 ENCOUNTER — Other Ambulatory Visit: Payer: Self-pay | Admitting: Nurse Practitioner

## 2023-11-28 ENCOUNTER — Telehealth: Payer: Self-pay

## 2023-11-28 ENCOUNTER — Other Ambulatory Visit: Payer: Self-pay

## 2023-11-28 ENCOUNTER — Encounter: Payer: Self-pay | Admitting: Nurse Practitioner

## 2023-11-28 ENCOUNTER — Telehealth: Payer: Self-pay | Admitting: Nurse Practitioner

## 2023-11-28 ENCOUNTER — Emergency Department
Admission: EM | Admit: 2023-11-28 | Discharge: 2023-11-28 | Disposition: A | Discharge status: Home / Self Care | Source: Ambulatory Visit | Attending: Emergency Medicine | Admitting: Emergency Medicine

## 2023-11-28 DIAGNOSIS — Z96632 Presence of left artificial wrist joint: Secondary | ICD-10-CM | POA: Insufficient documentation

## 2023-11-28 DIAGNOSIS — G43901 Migraine, unspecified, not intractable, with status migrainosus: Secondary | ICD-10-CM | POA: Insufficient documentation

## 2023-11-28 DIAGNOSIS — I110 Hypertensive heart disease with heart failure: Secondary | ICD-10-CM | POA: Diagnosis not present

## 2023-11-28 DIAGNOSIS — G4700 Insomnia, unspecified: Secondary | ICD-10-CM

## 2023-11-28 DIAGNOSIS — J4489 Other specified chronic obstructive pulmonary disease: Secondary | ICD-10-CM | POA: Diagnosis not present

## 2023-11-28 DIAGNOSIS — I509 Heart failure, unspecified: Secondary | ICD-10-CM | POA: Diagnosis not present

## 2023-11-28 DIAGNOSIS — G43909 Migraine, unspecified, not intractable, without status migrainosus: Secondary | ICD-10-CM | POA: Diagnosis present

## 2023-11-28 LAB — TROPONIN I (HIGH SENSITIVITY)
Troponin I (High Sensitivity): 5 ng/L (ref <18)
Troponin I (High Sensitivity): 6 ng/L (ref <18)

## 2023-11-28 LAB — CBC
HCT: 43.5 % (ref 36.0–46.0)
Hemoglobin: 15 g/dL (ref 12.0–15.0)
MCH: 30.4 pg (ref 26.0–34.0)
MCHC: 34.5 g/dL (ref 30.0–36.0)
MCV: 88.2 fL (ref 80.0–100.0)
Platelets: 279 K/uL (ref 150–400)
RBC: 4.93 MIL/uL (ref 3.87–5.11)
RDW: 13.9 % (ref 11.5–15.5)
WBC: 9.1 K/uL (ref 4.0–10.5)
nRBC: 0 % (ref 0.0–0.2)

## 2023-11-28 LAB — BASIC METABOLIC PANEL WITH GFR
Anion gap: 9 (ref 5–15)
BUN: 23 mg/dL (ref 8–23)
CO2: 25 mmol/L (ref 22–32)
Calcium: 9 mg/dL (ref 8.9–10.3)
Chloride: 105 mmol/L (ref 98–111)
Creatinine, Ser: 1 mg/dL (ref 0.44–1.00)
GFR, Estimated: 59 mL/min — ABNORMAL LOW (ref >60)
Glucose, Bld: 115 mg/dL — ABNORMAL HIGH (ref 70–99)
Potassium: 3.5 mmol/L (ref 3.5–5.1)
Sodium: 139 mmol/L (ref 135–145)

## 2023-11-28 MED ORDER — METOCLOPRAMIDE HCL 10 MG PO TABS: 10.0000 mg | ORAL_TABLET | Freq: Four times a day (QID) | ORAL | 0 refills | Status: AC | PRN

## 2023-11-28 MED ORDER — DIPHENHYDRAMINE HCL 50 MG/ML IJ SOLN
25.0000 mg | INTRAMUSCULAR | Status: AC
  Administered 2023-11-28: 25 mg via INTRAVENOUS
  Filled 2023-11-28: qty 1

## 2023-11-28 MED ORDER — DIPHENHYDRAMINE HCL 25 MG PO CAPS: 25.0000 mg | ORAL_CAPSULE | Freq: Four times a day (QID) | ORAL | 0 refills | Status: AC | PRN

## 2023-11-28 MED ORDER — SODIUM CHLORIDE 0.9 % IV BOLUS
1000.0000 mL | Freq: Once | INTRAVENOUS | Status: AC
  Administered 2023-11-28: 1000 mL via INTRAVENOUS

## 2023-11-28 MED ORDER — KETOROLAC TROMETHAMINE 15 MG/ML IJ SOLN
15.0000 mg | Freq: Once | INTRAMUSCULAR | Status: AC
  Administered 2023-11-28: 15 mg via INTRAVENOUS
  Filled 2023-11-28: qty 1

## 2023-11-28 MED ORDER — IBUPROFEN 200 MG PO TABS: 600.0000 mg | ORAL_TABLET | Freq: Four times a day (QID) | ORAL | 0 refills | Status: AC | PRN

## 2023-11-28 MED ORDER — METOCLOPRAMIDE HCL 5 MG/ML IJ SOLN
10.0000 mg | INTRAMUSCULAR | Status: AC
  Administered 2023-11-28: 10 mg via INTRAVENOUS
  Filled 2023-11-28: qty 2

## 2023-11-28 MED ORDER — METHYLPREDNISOLONE SODIUM SUCC 40 MG IJ SOLR
40.0000 mg | INTRAMUSCULAR | Status: AC
  Administered 2023-11-28: 40 mg via INTRAVENOUS
  Filled 2023-11-28: qty 1

## 2023-11-28 NOTE — Telephone Encounter (Signed)
 Notified patient to go to ED instead of coming here for acute visit per Alyssa-Toni

## 2023-11-28 NOTE — Telephone Encounter (Signed)
Lmom to pt call us back
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
## 2023-11-28 NOTE — Telephone Encounter (Signed)
 Please review

## 2023-11-28 NOTE — ED Triage Notes (Addendum)
 Pt comes with c/o migraine for over 9 days. Pt states nausea and vomiting. Pt also states left sided cp that started couple days ago.

## 2023-12-03 ENCOUNTER — Ambulatory Visit (INDEPENDENT_AMBULATORY_CARE_PROVIDER_SITE_OTHER): Admitting: Nurse Practitioner

## 2023-12-03 ENCOUNTER — Encounter: Payer: Self-pay | Admitting: Nurse Practitioner

## 2023-12-03 VITALS — BP 135/80 | HR 93 | Temp 94.8°F | Resp 16 | Ht 60.0 in | Wt 189.2 lb

## 2023-12-03 DIAGNOSIS — G4700 Insomnia, unspecified: Secondary | ICD-10-CM | POA: Diagnosis not present

## 2023-12-03 DIAGNOSIS — I5032 Chronic diastolic (congestive) heart failure: Secondary | ICD-10-CM | POA: Diagnosis not present

## 2023-12-03 DIAGNOSIS — G43E09 Chronic migraine with aura, not intractable, without status migrainosus: Secondary | ICD-10-CM | POA: Diagnosis not present

## 2023-12-03 DIAGNOSIS — M25512 Pain in left shoulder: Secondary | ICD-10-CM

## 2023-12-03 DIAGNOSIS — G8929 Other chronic pain: Secondary | ICD-10-CM

## 2023-12-03 DIAGNOSIS — M25511 Pain in right shoulder: Secondary | ICD-10-CM

## 2023-12-03 DIAGNOSIS — I5033 Acute on chronic diastolic (congestive) heart failure: Secondary | ICD-10-CM | POA: Insufficient documentation

## 2023-12-03 MED ORDER — ESZOPICLONE 3 MG PO TABS: 3.0000 mg | ORAL_TABLET | Freq: Every day | ORAL | 2 refills | Status: DC

## 2023-12-03 MED ORDER — DICLOFENAC SODIUM 50 MG PO TBEC: 50.0000 mg | DELAYED_RELEASE_TABLET | Freq: Two times a day (BID) | ORAL | 1 refills | Status: DC
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
## 2023-12-04 ENCOUNTER — Telehealth: Payer: Self-pay | Admitting: Nurse Practitioner

## 2023-12-04 NOTE — Telephone Encounter (Signed)
 Awaiting 12/03/23 office notes for Neurology referral-Toni

## 2023-12-09 ENCOUNTER — Other Ambulatory Visit: Payer: Self-pay | Admitting: Nurse Practitioner

## 2023-12-09 DIAGNOSIS — G8929 Other chronic pain: Secondary | ICD-10-CM

## 2023-12-11 ENCOUNTER — Telehealth: Payer: Self-pay | Admitting: Nurse Practitioner

## 2023-12-11 ENCOUNTER — Encounter: Payer: Self-pay | Admitting: Nurse Practitioner

## 2023-12-11 DIAGNOSIS — G43E09 Chronic migraine with aura, not intractable, without status migrainosus: Secondary | ICD-10-CM | POA: Insufficient documentation

## 2023-12-11 NOTE — Telephone Encounter (Signed)
 Urgent Neurology referral sent via [employer] to Dr. Maree with [facility]. Gave patient telephone # [phone]

## 2023-12-18 ENCOUNTER — Other Ambulatory Visit: Payer: Self-pay | Admitting: Nurse Practitioner

## 2023-12-18 ENCOUNTER — Telehealth: Payer: Self-pay | Admitting: Nurse Practitioner

## 2023-12-18 DIAGNOSIS — E876 Hypokalemia: Secondary | ICD-10-CM

## 2023-12-18 DIAGNOSIS — Z79899 Other long term (current) drug therapy: Secondary | ICD-10-CM

## 2023-12-18 NOTE — Telephone Encounter (Signed)
 Patient called stating [facility] cannot get her in until February 2026. She requested referral be sent to [facility]. Referral sent via Epic to [facility]-Toni
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
## 2023-12-23 ENCOUNTER — Encounter: Payer: Self-pay | Admitting: Neurology

## 2023-12-23 ENCOUNTER — Ambulatory Visit: Admitting: Neurology

## 2023-12-23 VITALS — BP 129/68 | HR 92 | Ht 60.0 in | Wt 191.0 lb

## 2023-12-23 DIAGNOSIS — G43E09 Chronic migraine with aura, not intractable, without status migrainosus: Secondary | ICD-10-CM

## 2023-12-23 DIAGNOSIS — G44221 Chronic tension-type headache, intractable: Secondary | ICD-10-CM

## 2023-12-23 DIAGNOSIS — J449 Chronic obstructive pulmonary disease, unspecified: Secondary | ICD-10-CM | POA: Diagnosis not present

## 2023-12-23 DIAGNOSIS — G4733 Obstructive sleep apnea (adult) (pediatric): Secondary | ICD-10-CM

## 2023-12-23 MED ORDER — BETAMETHASONE SOD PHOS & ACET 6 (3-3) MG/ML IJ SUSP
12.0000 mg | Freq: Once | INTRAMUSCULAR | Status: AC
  Administered 2023-12-23: 12 mg via INTRAMUSCULAR

## 2023-12-23 MED ORDER — IMIPRAMINE HCL 25 MG PO TABS: 25.0000 mg | ORAL_TABLET | Freq: Every day | ORAL | 5 refills | Status: AC

## 2024-01-10 ENCOUNTER — Telehealth: Payer: Self-pay | Admitting: Nurse Practitioner

## 2024-01-10 NOTE — Telephone Encounter (Signed)
 Per Lonell with [facility], referral declined. Patient already has appt with [facility]

## 2024-01-16 ENCOUNTER — Other Ambulatory Visit: Payer: Self-pay | Admitting: Neurology

## 2024-01-28 ENCOUNTER — Other Ambulatory Visit: Payer: Self-pay | Admitting: Nurse Practitioner

## 2024-01-28 DIAGNOSIS — I10 Essential (primary) hypertension: Secondary | ICD-10-CM

## 2024-01-29 ENCOUNTER — Other Ambulatory Visit: Payer: Self-pay | Admitting: Nurse Practitioner

## 2024-01-29 DIAGNOSIS — I10 Essential (primary) hypertension: Secondary | ICD-10-CM

## 2024-01-29 MED ORDER — BUMETANIDE 2 MG PO TABS: 2.0000 mg | ORAL_TABLET | Freq: Every day | ORAL | 1 refills | Status: AC

## 2024-02-25 ENCOUNTER — Ambulatory Visit (INDEPENDENT_AMBULATORY_CARE_PROVIDER_SITE_OTHER): Admitting: Nurse Practitioner

## 2024-02-25 ENCOUNTER — Encounter: Payer: Self-pay | Admitting: Nurse Practitioner

## 2024-02-25 VITALS — BP 132/86 | HR 80 | Temp 95.7°F | Resp 16 | Ht 60.0 in | Wt 176.8 lb

## 2024-02-25 DIAGNOSIS — E876 Hypokalemia: Secondary | ICD-10-CM

## 2024-02-25 DIAGNOSIS — K760 Fatty (change of) liver, not elsewhere classified: Secondary | ICD-10-CM | POA: Diagnosis not present

## 2024-02-25 DIAGNOSIS — J449 Chronic obstructive pulmonary disease, unspecified: Secondary | ICD-10-CM

## 2024-02-25 DIAGNOSIS — M25512 Pain in left shoulder: Secondary | ICD-10-CM

## 2024-02-25 DIAGNOSIS — I10 Essential (primary) hypertension: Secondary | ICD-10-CM | POA: Diagnosis not present

## 2024-02-25 DIAGNOSIS — E559 Vitamin D deficiency, unspecified: Secondary | ICD-10-CM

## 2024-02-25 DIAGNOSIS — M25511 Pain in right shoulder: Secondary | ICD-10-CM | POA: Diagnosis not present

## 2024-02-25 DIAGNOSIS — G4700 Insomnia, unspecified: Secondary | ICD-10-CM | POA: Diagnosis not present

## 2024-02-25 DIAGNOSIS — E039 Hypothyroidism, unspecified: Secondary | ICD-10-CM | POA: Diagnosis not present

## 2024-02-25 DIAGNOSIS — E782 Mixed hyperlipidemia: Secondary | ICD-10-CM

## 2024-02-25 DIAGNOSIS — R053 Chronic cough: Secondary | ICD-10-CM

## 2024-02-25 DIAGNOSIS — Z79899 Other long term (current) drug therapy: Secondary | ICD-10-CM

## 2024-02-25 DIAGNOSIS — E538 Deficiency of other specified B group vitamins: Secondary | ICD-10-CM | POA: Diagnosis not present

## 2024-02-25 DIAGNOSIS — G8929 Other chronic pain: Secondary | ICD-10-CM | POA: Diagnosis not present

## 2024-02-25 MED ORDER — LEVOTHYROXINE SODIUM 100 MCG PO TABS: 100.0000 ug | ORAL_TABLET | Freq: Every day | ORAL | 1 refills | Status: AC

## 2024-02-25 MED ORDER — SPIRONOLACTONE 25 MG PO TABS: 25.0000 mg | ORAL_TABLET | Freq: Every day | ORAL | 1 refills | Status: AC

## 2024-02-25 MED ORDER — ESZOPICLONE 3 MG PO TABS: 3.0000 mg | ORAL_TABLET | Freq: Every day | ORAL | 2 refills | Status: AC

## 2024-02-25 MED ORDER — DICLOFENAC SODIUM 50 MG PO TBEC: 50.0000 mg | DELAYED_RELEASE_TABLET | Freq: Two times a day (BID) | ORAL | 1 refills | Status: AC

## 2024-02-25 MED ORDER — BREZTRI AEROSPHERE 160-9-4.8 MCG/ACT IN AERO: 2.0000 | INHALATION_SPRAY | Freq: Two times a day (BID) | RESPIRATORY_TRACT | 3 refills | Status: AC

## 2024-02-25 MED ORDER — POTASSIUM CHLORIDE CRYS ER 20 MEQ PO TBCR: 20.0000 meq | EXTENDED_RELEASE_TABLET | Freq: Two times a day (BID) | ORAL | 1 refills | Status: AC
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
## 2024-02-26 ENCOUNTER — Other Ambulatory Visit: Payer: Self-pay | Admitting: Nurse Practitioner

## 2024-02-26 DIAGNOSIS — E538 Deficiency of other specified B group vitamins: Secondary | ICD-10-CM

## 2024-02-26 DIAGNOSIS — E039 Hypothyroidism, unspecified: Secondary | ICD-10-CM

## 2024-02-26 DIAGNOSIS — K760 Fatty (change of) liver, not elsewhere classified: Secondary | ICD-10-CM

## 2024-02-26 DIAGNOSIS — E782 Mixed hyperlipidemia: Secondary | ICD-10-CM

## 2024-02-26 DIAGNOSIS — E559 Vitamin D deficiency, unspecified: Secondary | ICD-10-CM

## 2024-02-27 ENCOUNTER — Encounter: Payer: Self-pay | Admitting: Nurse Practitioner

## 2024-03-11 ENCOUNTER — Other Ambulatory Visit: Payer: Self-pay | Admitting: Nurse Practitioner

## 2024-03-11 DIAGNOSIS — G8929 Other chronic pain: Secondary | ICD-10-CM

## 2024-03-19 ENCOUNTER — Encounter: Payer: Self-pay | Admitting: *Deleted

## 2024-03-23 ENCOUNTER — Encounter: Payer: Self-pay | Admitting: Nurse Practitioner

## 2024-03-24 MED ORDER — GABAPENTIN 300 MG PO CAPS: 300.0000 mg | ORAL_CAPSULE | Freq: Three times a day (TID) | ORAL | 5 refills | Status: AC

## 2024-05-06 ENCOUNTER — Ambulatory Visit: Admitting: Nurse Practitioner

## 2024-07-01 ENCOUNTER — Encounter: Admitting: Internal Medicine
